# Patient Record
Sex: Female | Born: 1970 | Race: White | Hispanic: No | Marital: Single | Smoking: Current some day smoker
Health system: Southern US, Community
[De-identification: ages and names within clinical notes are randomized; demographics above are authoritative.]

## PROBLEM LIST (undated history)

## (undated) DIAGNOSIS — F419 Anxiety disorder, unspecified: Secondary | ICD-10-CM

## (undated) DIAGNOSIS — J449 Chronic obstructive pulmonary disease, unspecified: Secondary | ICD-10-CM

## (undated) DIAGNOSIS — K589 Irritable bowel syndrome without diarrhea: Secondary | ICD-10-CM

## (undated) DIAGNOSIS — J40 Bronchitis, not specified as acute or chronic: Secondary | ICD-10-CM

## (undated) DIAGNOSIS — G8929 Other chronic pain: Secondary | ICD-10-CM

## (undated) DIAGNOSIS — Z765 Malingerer [conscious simulation]: Secondary | ICD-10-CM

## (undated) DIAGNOSIS — F99 Mental disorder, not otherwise specified: Secondary | ICD-10-CM

## (undated) DIAGNOSIS — G4730 Sleep apnea, unspecified: Secondary | ICD-10-CM

## (undated) DIAGNOSIS — F1910 Other psychoactive substance abuse, uncomplicated: Secondary | ICD-10-CM

## (undated) DIAGNOSIS — F319 Bipolar disorder, unspecified: Secondary | ICD-10-CM

## (undated) DIAGNOSIS — K089 Disorder of teeth and supporting structures, unspecified: Secondary | ICD-10-CM

## (undated) DIAGNOSIS — M545 Low back pain: Secondary | ICD-10-CM

## (undated) DIAGNOSIS — N390 Urinary tract infection, site not specified: Secondary | ICD-10-CM

## (undated) DIAGNOSIS — R609 Edema, unspecified: Secondary | ICD-10-CM

## (undated) DIAGNOSIS — K219 Gastro-esophageal reflux disease without esophagitis: Secondary | ICD-10-CM

## (undated) HISTORY — DX: Gastro-esophageal reflux disease without esophagitis: K21.9

## (undated) HISTORY — DX: Bipolar disorder, unspecified: F31.9

## (undated) HISTORY — DX: Irritable bowel syndrome without diarrhea: K58.9

## (undated) HISTORY — DX: Anxiety disorder, unspecified: F41.9

## (undated) HISTORY — DX: Mental disorder, not otherwise specified: F99

## (undated) HISTORY — DX: Urinary tract infection, site not specified: N39.0

## (undated) HISTORY — DX: Low back pain: M54.5

## (undated) HISTORY — DX: Sleep apnea, unspecified: G47.30

## (undated) HISTORY — DX: Other psychoactive substance abuse, uncomplicated: F19.10

## (undated) HISTORY — DX: Other chronic pain: G89.29

## (undated) HISTORY — PX: WISDOM TOOTH EXTRACTION: SHX21

---

## 1998-05-14 ENCOUNTER — Inpatient Hospital Stay (HOSPITAL_COMMUNITY): Admission: AD | Admit: 1998-05-14 | Discharge: 1998-05-14 | Payer: Self-pay | Admitting: Obstetrics & Gynecology

## 1998-11-24 HISTORY — PX: DILATION AND CURETTAGE OF UTERUS: SHX78

## 1998-12-30 ENCOUNTER — Emergency Department (HOSPITAL_COMMUNITY): Admission: EM | Admit: 1998-12-30 | Discharge: 1998-12-30 | Payer: Self-pay | Admitting: Emergency Medicine

## 1998-12-31 ENCOUNTER — Encounter: Payer: Self-pay | Admitting: Emergency Medicine

## 1998-12-31 ENCOUNTER — Emergency Department (HOSPITAL_COMMUNITY): Admission: EM | Admit: 1998-12-31 | Discharge: 1998-12-31 | Payer: Self-pay | Admitting: Emergency Medicine

## 1999-01-05 ENCOUNTER — Inpatient Hospital Stay (HOSPITAL_COMMUNITY): Admission: AD | Admit: 1999-01-05 | Discharge: 1999-01-05 | Payer: Self-pay | Admitting: *Deleted

## 1999-05-01 ENCOUNTER — Encounter: Payer: Self-pay | Admitting: Emergency Medicine

## 1999-05-01 ENCOUNTER — Emergency Department (HOSPITAL_COMMUNITY): Admission: EM | Admit: 1999-05-01 | Discharge: 1999-05-01 | Payer: Self-pay | Admitting: *Deleted

## 1999-05-21 ENCOUNTER — Inpatient Hospital Stay (HOSPITAL_COMMUNITY): Admission: AD | Admit: 1999-05-21 | Discharge: 1999-05-21 | Payer: Self-pay | Admitting: *Deleted

## 1999-05-27 ENCOUNTER — Encounter: Payer: Self-pay | Admitting: *Deleted

## 1999-07-09 ENCOUNTER — Inpatient Hospital Stay (HOSPITAL_COMMUNITY): Admission: AD | Admit: 1999-07-09 | Discharge: 1999-07-09 | Payer: Self-pay | Admitting: *Deleted

## 1999-10-05 ENCOUNTER — Emergency Department (HOSPITAL_COMMUNITY): Admission: EM | Admit: 1999-10-05 | Discharge: 1999-10-05 | Payer: Self-pay | Admitting: Emergency Medicine

## 1999-10-05 ENCOUNTER — Encounter: Payer: Self-pay | Admitting: Emergency Medicine

## 1999-10-31 ENCOUNTER — Ambulatory Visit (HOSPITAL_COMMUNITY): Admission: RE | Admit: 1999-10-31 | Discharge: 1999-10-31 | Payer: Self-pay | Admitting: *Deleted

## 1999-11-05 ENCOUNTER — Emergency Department (HOSPITAL_COMMUNITY): Admission: EM | Admit: 1999-11-05 | Discharge: 1999-11-05 | Payer: Self-pay | Admitting: Emergency Medicine

## 1999-11-25 HISTORY — PX: TUBAL LIGATION: SHX77

## 1999-12-02 ENCOUNTER — Emergency Department (HOSPITAL_COMMUNITY): Admission: EM | Admit: 1999-12-02 | Discharge: 1999-12-03 | Payer: Self-pay | Admitting: Emergency Medicine

## 1999-12-10 ENCOUNTER — Emergency Department (HOSPITAL_COMMUNITY): Admission: EM | Admit: 1999-12-10 | Discharge: 1999-12-10 | Payer: Self-pay | Admitting: Emergency Medicine

## 1999-12-14 ENCOUNTER — Inpatient Hospital Stay (HOSPITAL_COMMUNITY): Admission: AD | Admit: 1999-12-14 | Discharge: 1999-12-14 | Payer: Self-pay | Admitting: Obstetrics

## 1999-12-15 ENCOUNTER — Inpatient Hospital Stay (HOSPITAL_COMMUNITY): Admission: AD | Admit: 1999-12-15 | Discharge: 1999-12-15 | Payer: Self-pay | Admitting: Obstetrics

## 1999-12-24 ENCOUNTER — Inpatient Hospital Stay (HOSPITAL_COMMUNITY): Admission: AD | Admit: 1999-12-24 | Discharge: 1999-12-24 | Payer: Self-pay | Admitting: *Deleted

## 2000-01-06 ENCOUNTER — Inpatient Hospital Stay (HOSPITAL_COMMUNITY): Admission: AD | Admit: 2000-01-06 | Discharge: 2000-01-06 | Payer: Self-pay | Admitting: Obstetrics

## 2000-01-07 ENCOUNTER — Inpatient Hospital Stay (HOSPITAL_COMMUNITY): Admission: AD | Admit: 2000-01-07 | Discharge: 2000-01-07 | Payer: Self-pay | Admitting: *Deleted

## 2000-01-11 ENCOUNTER — Emergency Department (HOSPITAL_COMMUNITY): Admission: EM | Admit: 2000-01-11 | Discharge: 2000-01-11 | Payer: Self-pay | Admitting: *Deleted

## 2000-01-14 ENCOUNTER — Inpatient Hospital Stay (HOSPITAL_COMMUNITY): Admission: AD | Admit: 2000-01-14 | Discharge: 2000-01-14 | Payer: Self-pay | Admitting: *Deleted

## 2000-01-16 ENCOUNTER — Inpatient Hospital Stay (HOSPITAL_COMMUNITY): Admission: AD | Admit: 2000-01-16 | Discharge: 2000-01-16 | Payer: Self-pay | Admitting: Obstetrics & Gynecology

## 2000-01-22 ENCOUNTER — Inpatient Hospital Stay (HOSPITAL_COMMUNITY): Admission: AD | Admit: 2000-01-22 | Discharge: 2000-01-22 | Payer: Self-pay | Admitting: Obstetrics

## 2000-01-23 ENCOUNTER — Inpatient Hospital Stay (HOSPITAL_COMMUNITY): Admission: AD | Admit: 2000-01-23 | Discharge: 2000-01-25 | Payer: Self-pay | Admitting: Obstetrics & Gynecology

## 2000-02-06 ENCOUNTER — Emergency Department (HOSPITAL_COMMUNITY): Admission: EM | Admit: 2000-02-06 | Discharge: 2000-02-06 | Payer: Self-pay | Admitting: Emergency Medicine

## 2000-02-11 ENCOUNTER — Emergency Department (HOSPITAL_COMMUNITY): Admission: EM | Admit: 2000-02-11 | Discharge: 2000-02-11 | Payer: Self-pay | Admitting: *Deleted

## 2000-02-13 ENCOUNTER — Inpatient Hospital Stay (HOSPITAL_COMMUNITY): Admission: AD | Admit: 2000-02-13 | Discharge: 2000-02-13 | Payer: Self-pay | Admitting: Obstetrics & Gynecology

## 2000-02-14 ENCOUNTER — Emergency Department (HOSPITAL_COMMUNITY): Admission: EM | Admit: 2000-02-14 | Discharge: 2000-02-14 | Payer: Self-pay | Admitting: Emergency Medicine

## 2000-02-21 ENCOUNTER — Observation Stay (HOSPITAL_COMMUNITY): Admission: AD | Admit: 2000-02-21 | Discharge: 2000-02-22 | Payer: Self-pay | Admitting: Obstetrics

## 2000-02-21 ENCOUNTER — Encounter (INDEPENDENT_AMBULATORY_CARE_PROVIDER_SITE_OTHER): Payer: Self-pay

## 2000-02-21 ENCOUNTER — Encounter: Payer: Self-pay | Admitting: Obstetrics

## 2000-02-27 ENCOUNTER — Inpatient Hospital Stay (HOSPITAL_COMMUNITY): Admission: AD | Admit: 2000-02-27 | Discharge: 2000-02-27 | Payer: Self-pay | Admitting: Obstetrics & Gynecology

## 2000-03-01 ENCOUNTER — Emergency Department (HOSPITAL_COMMUNITY): Admission: EM | Admit: 2000-03-01 | Discharge: 2000-03-01 | Payer: Self-pay | Admitting: Emergency Medicine

## 2000-03-12 ENCOUNTER — Emergency Department (HOSPITAL_COMMUNITY): Admission: EM | Admit: 2000-03-12 | Discharge: 2000-03-12 | Payer: Self-pay | Admitting: Emergency Medicine

## 2000-03-13 ENCOUNTER — Other Ambulatory Visit: Admission: RE | Admit: 2000-03-13 | Discharge: 2000-03-13 | Payer: Self-pay | Admitting: Obstetrics & Gynecology

## 2000-03-13 ENCOUNTER — Encounter: Admission: RE | Admit: 2000-03-13 | Discharge: 2000-03-13 | Payer: Self-pay | Admitting: Obstetrics & Gynecology

## 2000-03-23 ENCOUNTER — Ambulatory Visit (HOSPITAL_COMMUNITY): Admission: RE | Admit: 2000-03-23 | Discharge: 2000-03-23 | Payer: Self-pay | Admitting: Obstetrics & Gynecology

## 2000-03-29 ENCOUNTER — Emergency Department (HOSPITAL_COMMUNITY): Admission: EM | Admit: 2000-03-29 | Discharge: 2000-03-29 | Payer: Self-pay | Admitting: Emergency Medicine

## 2000-04-30 ENCOUNTER — Encounter: Admission: RE | Admit: 2000-04-30 | Discharge: 2000-04-30 | Payer: Self-pay | Admitting: Obstetrics

## 2000-07-10 ENCOUNTER — Emergency Department (HOSPITAL_COMMUNITY): Admission: EM | Admit: 2000-07-10 | Discharge: 2000-07-10 | Payer: Self-pay | Admitting: Emergency Medicine

## 2000-07-19 ENCOUNTER — Encounter: Payer: Self-pay | Admitting: *Deleted

## 2000-07-19 ENCOUNTER — Emergency Department (HOSPITAL_COMMUNITY): Admission: EM | Admit: 2000-07-19 | Discharge: 2000-07-19 | Payer: Self-pay | Admitting: Emergency Medicine

## 2000-08-18 ENCOUNTER — Inpatient Hospital Stay (HOSPITAL_COMMUNITY): Admission: AD | Admit: 2000-08-18 | Discharge: 2000-08-18 | Payer: Self-pay | Admitting: *Deleted

## 2001-03-17 ENCOUNTER — Encounter: Payer: Self-pay | Admitting: Emergency Medicine

## 2001-03-17 ENCOUNTER — Emergency Department (HOSPITAL_COMMUNITY): Admission: EM | Admit: 2001-03-17 | Discharge: 2001-03-17 | Payer: Self-pay | Admitting: Emergency Medicine

## 2001-03-25 ENCOUNTER — Emergency Department (HOSPITAL_COMMUNITY): Admission: EM | Admit: 2001-03-25 | Discharge: 2001-03-25 | Payer: Self-pay | Admitting: Emergency Medicine

## 2001-07-30 ENCOUNTER — Emergency Department (HOSPITAL_COMMUNITY): Admission: EM | Admit: 2001-07-30 | Discharge: 2001-07-30 | Payer: Self-pay | Admitting: Emergency Medicine

## 2001-09-24 ENCOUNTER — Inpatient Hospital Stay (HOSPITAL_COMMUNITY): Admission: AD | Admit: 2001-09-24 | Discharge: 2001-09-24 | Payer: Self-pay | Admitting: *Deleted

## 2001-09-24 ENCOUNTER — Encounter: Payer: Self-pay | Admitting: *Deleted

## 2001-10-08 ENCOUNTER — Emergency Department (HOSPITAL_COMMUNITY): Admission: EM | Admit: 2001-10-08 | Discharge: 2001-10-08 | Payer: Self-pay | Admitting: Emergency Medicine

## 2001-11-07 ENCOUNTER — Inpatient Hospital Stay (HOSPITAL_COMMUNITY): Admission: AD | Admit: 2001-11-07 | Discharge: 2001-11-07 | Payer: Self-pay | Admitting: *Deleted

## 2001-11-09 ENCOUNTER — Encounter (HOSPITAL_COMMUNITY): Admission: RE | Admit: 2001-11-09 | Discharge: 2001-11-11 | Payer: Self-pay | Admitting: *Deleted

## 2001-11-10 ENCOUNTER — Inpatient Hospital Stay (HOSPITAL_COMMUNITY): Admission: AD | Admit: 2001-11-10 | Discharge: 2001-11-12 | Payer: Self-pay | Admitting: Obstetrics

## 2001-12-01 ENCOUNTER — Encounter: Admission: RE | Admit: 2001-12-01 | Discharge: 2001-12-01 | Payer: Self-pay

## 2001-12-31 ENCOUNTER — Encounter: Admission: RE | Admit: 2001-12-31 | Discharge: 2001-12-31 | Payer: Self-pay | Admitting: Internal Medicine

## 2002-01-25 ENCOUNTER — Emergency Department (HOSPITAL_COMMUNITY): Admission: EM | Admit: 2002-01-25 | Discharge: 2002-01-25 | Payer: Self-pay

## 2002-01-27 ENCOUNTER — Encounter: Admission: RE | Admit: 2002-01-27 | Discharge: 2002-01-27 | Payer: Self-pay

## 2002-02-19 ENCOUNTER — Inpatient Hospital Stay (HOSPITAL_COMMUNITY): Admission: AD | Admit: 2002-02-19 | Discharge: 2002-02-19 | Payer: Self-pay | Admitting: *Deleted

## 2002-03-14 ENCOUNTER — Emergency Department (HOSPITAL_COMMUNITY): Admission: EM | Admit: 2002-03-14 | Discharge: 2002-03-14 | Payer: Self-pay

## 2002-03-14 ENCOUNTER — Encounter: Payer: Self-pay | Admitting: Emergency Medicine

## 2002-04-26 ENCOUNTER — Encounter: Payer: Self-pay | Admitting: Emergency Medicine

## 2002-04-26 ENCOUNTER — Emergency Department (HOSPITAL_COMMUNITY): Admission: EM | Admit: 2002-04-26 | Discharge: 2002-04-26 | Payer: Self-pay | Admitting: Emergency Medicine

## 2002-05-29 ENCOUNTER — Encounter (INDEPENDENT_AMBULATORY_CARE_PROVIDER_SITE_OTHER): Payer: Self-pay

## 2002-05-29 ENCOUNTER — Encounter: Payer: Self-pay | Admitting: Obstetrics and Gynecology

## 2002-05-29 ENCOUNTER — Observation Stay (HOSPITAL_COMMUNITY): Admission: AD | Admit: 2002-05-29 | Discharge: 2002-05-30 | Payer: Self-pay | Admitting: Obstetrics and Gynecology

## 2002-11-19 ENCOUNTER — Encounter: Payer: Self-pay | Admitting: Emergency Medicine

## 2002-11-19 ENCOUNTER — Emergency Department (HOSPITAL_COMMUNITY): Admission: EM | Admit: 2002-11-19 | Discharge: 2002-11-19 | Payer: Self-pay | Admitting: Emergency Medicine

## 2002-12-04 ENCOUNTER — Emergency Department (HOSPITAL_COMMUNITY): Admission: EM | Admit: 2002-12-04 | Discharge: 2002-12-04 | Payer: Self-pay | Admitting: Emergency Medicine

## 2003-02-23 ENCOUNTER — Emergency Department (HOSPITAL_COMMUNITY): Admission: EM | Admit: 2003-02-23 | Discharge: 2003-02-23 | Payer: Self-pay | Admitting: *Deleted

## 2003-12-31 ENCOUNTER — Emergency Department (HOSPITAL_COMMUNITY): Admission: EM | Admit: 2003-12-31 | Discharge: 2003-12-31 | Payer: Self-pay | Admitting: Emergency Medicine

## 2004-01-02 ENCOUNTER — Emergency Department (HOSPITAL_COMMUNITY): Admission: EM | Admit: 2004-01-02 | Discharge: 2004-01-02 | Payer: Self-pay | Admitting: Emergency Medicine

## 2004-01-05 ENCOUNTER — Emergency Department (HOSPITAL_COMMUNITY): Admission: EM | Admit: 2004-01-05 | Discharge: 2004-01-05 | Payer: Self-pay | Admitting: Family Medicine

## 2004-01-25 ENCOUNTER — Inpatient Hospital Stay (HOSPITAL_COMMUNITY): Admission: AD | Admit: 2004-01-25 | Discharge: 2004-01-25 | Payer: Self-pay | Admitting: Obstetrics & Gynecology

## 2004-02-03 ENCOUNTER — Emergency Department (HOSPITAL_COMMUNITY): Admission: EM | Admit: 2004-02-03 | Discharge: 2004-02-03 | Payer: Self-pay | Admitting: Emergency Medicine

## 2004-02-05 ENCOUNTER — Emergency Department (HOSPITAL_COMMUNITY): Admission: EM | Admit: 2004-02-05 | Discharge: 2004-02-06 | Payer: Self-pay | Admitting: Emergency Medicine

## 2004-03-01 ENCOUNTER — Emergency Department (HOSPITAL_COMMUNITY): Admission: EM | Admit: 2004-03-01 | Discharge: 2004-03-01 | Payer: Self-pay | Admitting: Emergency Medicine

## 2004-06-22 ENCOUNTER — Emergency Department (HOSPITAL_COMMUNITY): Admission: EM | Admit: 2004-06-22 | Discharge: 2004-06-22 | Payer: Self-pay | Admitting: Emergency Medicine

## 2004-07-22 ENCOUNTER — Emergency Department (HOSPITAL_COMMUNITY): Admission: EM | Admit: 2004-07-22 | Discharge: 2004-07-22 | Payer: Self-pay | Admitting: Emergency Medicine

## 2004-07-25 ENCOUNTER — Emergency Department (HOSPITAL_COMMUNITY): Admission: EM | Admit: 2004-07-25 | Discharge: 2004-07-25 | Payer: Self-pay | Admitting: Emergency Medicine

## 2004-08-21 ENCOUNTER — Emergency Department (HOSPITAL_COMMUNITY): Admission: EM | Admit: 2004-08-21 | Discharge: 2004-08-21 | Payer: Self-pay | Admitting: Emergency Medicine

## 2004-09-09 ENCOUNTER — Emergency Department (HOSPITAL_COMMUNITY): Admission: EM | Admit: 2004-09-09 | Discharge: 2004-09-09 | Payer: Self-pay | Admitting: Emergency Medicine

## 2004-09-15 ENCOUNTER — Emergency Department (HOSPITAL_COMMUNITY): Admission: EM | Admit: 2004-09-15 | Discharge: 2004-09-15 | Payer: Self-pay | Admitting: Emergency Medicine

## 2004-09-15 ENCOUNTER — Emergency Department (HOSPITAL_COMMUNITY): Admission: EM | Admit: 2004-09-15 | Discharge: 2004-09-15 | Payer: Self-pay | Admitting: *Deleted

## 2004-09-25 ENCOUNTER — Emergency Department (HOSPITAL_COMMUNITY): Admission: EM | Admit: 2004-09-25 | Discharge: 2004-09-25 | Payer: Self-pay | Admitting: Emergency Medicine

## 2004-10-02 ENCOUNTER — Emergency Department (HOSPITAL_COMMUNITY): Admission: EM | Admit: 2004-10-02 | Discharge: 2004-10-02 | Payer: Self-pay | Admitting: Emergency Medicine

## 2004-10-31 ENCOUNTER — Emergency Department (HOSPITAL_COMMUNITY): Admission: EM | Admit: 2004-10-31 | Discharge: 2004-10-31 | Payer: Self-pay | Admitting: Emergency Medicine

## 2004-11-06 ENCOUNTER — Emergency Department (HOSPITAL_COMMUNITY): Admission: EM | Admit: 2004-11-06 | Discharge: 2004-11-06 | Payer: Self-pay | Admitting: Emergency Medicine

## 2004-11-07 ENCOUNTER — Emergency Department (HOSPITAL_COMMUNITY): Admission: EM | Admit: 2004-11-07 | Discharge: 2004-11-07 | Payer: Self-pay | Admitting: Emergency Medicine

## 2004-12-05 ENCOUNTER — Emergency Department (HOSPITAL_COMMUNITY): Admission: EM | Admit: 2004-12-05 | Discharge: 2004-12-05 | Payer: Self-pay | Admitting: Emergency Medicine

## 2004-12-12 ENCOUNTER — Emergency Department (HOSPITAL_COMMUNITY): Admission: EM | Admit: 2004-12-12 | Discharge: 2004-12-12 | Payer: Self-pay | Admitting: Emergency Medicine

## 2004-12-28 ENCOUNTER — Encounter: Payer: Self-pay | Admitting: Emergency Medicine

## 2004-12-28 ENCOUNTER — Inpatient Hospital Stay (HOSPITAL_COMMUNITY): Admission: AD | Admit: 2004-12-28 | Discharge: 2004-12-31 | Payer: Self-pay | Admitting: Psychiatry

## 2004-12-28 ENCOUNTER — Ambulatory Visit: Payer: Self-pay | Admitting: *Deleted

## 2005-01-27 ENCOUNTER — Emergency Department (HOSPITAL_COMMUNITY): Admission: EM | Admit: 2005-01-27 | Discharge: 2005-01-27 | Payer: Self-pay | Admitting: Emergency Medicine

## 2005-01-31 ENCOUNTER — Emergency Department: Payer: Self-pay | Admitting: General Practice

## 2005-02-12 ENCOUNTER — Emergency Department (HOSPITAL_COMMUNITY): Admission: EM | Admit: 2005-02-12 | Discharge: 2005-02-12 | Payer: Self-pay | Admitting: Emergency Medicine

## 2005-03-03 ENCOUNTER — Emergency Department (HOSPITAL_COMMUNITY): Admission: EM | Admit: 2005-03-03 | Discharge: 2005-03-03 | Payer: Self-pay | Admitting: Emergency Medicine

## 2005-03-05 ENCOUNTER — Inpatient Hospital Stay (HOSPITAL_COMMUNITY): Admission: EM | Admit: 2005-03-05 | Discharge: 2005-03-09 | Payer: Self-pay | Admitting: Emergency Medicine

## 2005-04-10 ENCOUNTER — Inpatient Hospital Stay (HOSPITAL_COMMUNITY): Admission: AD | Admit: 2005-04-10 | Discharge: 2005-04-10 | Payer: Self-pay | Admitting: Obstetrics & Gynecology

## 2005-04-11 ENCOUNTER — Emergency Department (HOSPITAL_COMMUNITY): Admission: EM | Admit: 2005-04-11 | Discharge: 2005-04-12 | Payer: Self-pay | Admitting: Emergency Medicine

## 2005-04-17 ENCOUNTER — Emergency Department (HOSPITAL_COMMUNITY): Admission: EM | Admit: 2005-04-17 | Discharge: 2005-04-17 | Payer: Self-pay | Admitting: Emergency Medicine

## 2005-04-18 ENCOUNTER — Inpatient Hospital Stay (HOSPITAL_COMMUNITY): Admission: AD | Admit: 2005-04-18 | Discharge: 2005-04-19 | Payer: Self-pay | Admitting: Obstetrics & Gynecology

## 2005-09-02 ENCOUNTER — Emergency Department (HOSPITAL_COMMUNITY): Admission: EM | Admit: 2005-09-02 | Discharge: 2005-09-02 | Payer: Self-pay | Admitting: Emergency Medicine

## 2005-10-28 ENCOUNTER — Emergency Department (HOSPITAL_COMMUNITY): Admission: EM | Admit: 2005-10-28 | Discharge: 2005-10-28 | Payer: Self-pay | Admitting: Emergency Medicine

## 2005-10-31 ENCOUNTER — Emergency Department (HOSPITAL_COMMUNITY): Admission: EM | Admit: 2005-10-31 | Discharge: 2005-10-31 | Payer: Self-pay | Admitting: Emergency Medicine

## 2005-11-08 ENCOUNTER — Emergency Department (HOSPITAL_COMMUNITY): Admission: EM | Admit: 2005-11-08 | Discharge: 2005-11-08 | Payer: Self-pay | Admitting: Emergency Medicine

## 2005-11-10 ENCOUNTER — Emergency Department (HOSPITAL_COMMUNITY): Admission: EM | Admit: 2005-11-10 | Discharge: 2005-11-10 | Payer: Self-pay | Admitting: Emergency Medicine

## 2005-11-15 ENCOUNTER — Emergency Department (HOSPITAL_COMMUNITY): Admission: EM | Admit: 2005-11-15 | Discharge: 2005-11-15 | Payer: Self-pay | Admitting: *Deleted

## 2005-12-30 ENCOUNTER — Emergency Department (HOSPITAL_COMMUNITY): Admission: EM | Admit: 2005-12-30 | Discharge: 2005-12-30 | Payer: Self-pay | Admitting: Emergency Medicine

## 2006-07-22 ENCOUNTER — Emergency Department (HOSPITAL_COMMUNITY): Admission: EM | Admit: 2006-07-22 | Discharge: 2006-07-22 | Payer: Self-pay | Admitting: Emergency Medicine

## 2006-09-18 ENCOUNTER — Emergency Department (HOSPITAL_COMMUNITY): Admission: EM | Admit: 2006-09-18 | Discharge: 2006-09-18 | Payer: Self-pay | Admitting: Emergency Medicine

## 2006-12-11 ENCOUNTER — Emergency Department (HOSPITAL_COMMUNITY): Admission: EM | Admit: 2006-12-11 | Discharge: 2006-12-11 | Payer: Self-pay | Admitting: Emergency Medicine

## 2006-12-14 ENCOUNTER — Emergency Department (HOSPITAL_COMMUNITY): Admission: EM | Admit: 2006-12-14 | Discharge: 2006-12-14 | Payer: Self-pay | Admitting: Emergency Medicine

## 2007-03-08 IMAGING — CT CT HEAD W/O CM
2 of 3 series · 15 of 30 positions shown, 18 images · IV contrast (CONTRAST)
Comparison: 12/28/04.

CLINICAL DATA: Headache.   
HEAD CT WITHOUT CONTRAST:
TECHNIQUE: Contiguous axial images were obtained from the base of the skull through the vertex according to standard protocol without contrast media.
CLINICAL DATA: Follow-up to 10/31/05 CT which revealed small subcapsular hematoma.  
ABDOMEN CT WITHOUT CONTRAST:
TECHNIQUE: Multidetector CT imaging of the abdomen was performed following the standard protocol without IV contrast.

[Series 8180: — · axial · 0.44mm/px · z∈[-642,-562]mm · 4 of 28 slices shown (1 of 2)]
[im 6/28  brain]
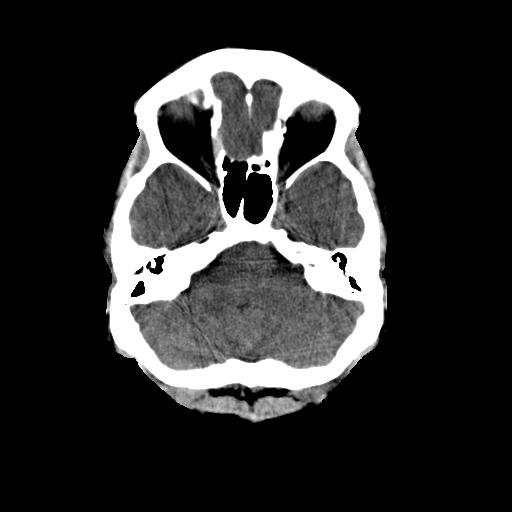
[im 11/28  brain]
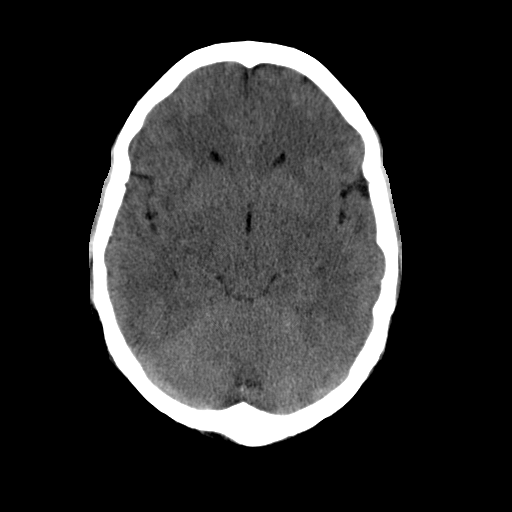
[im 17/28  brain]
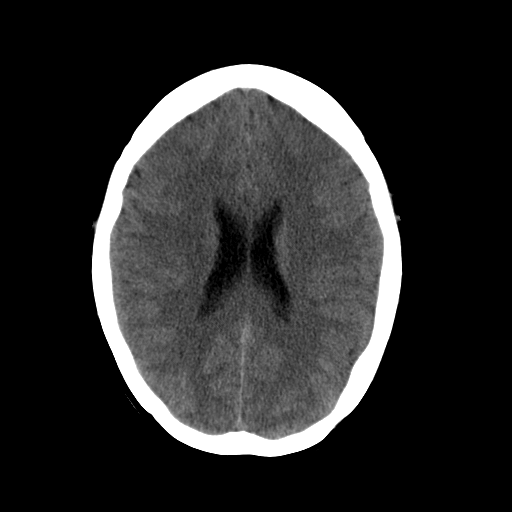
[im 22/28  brain]
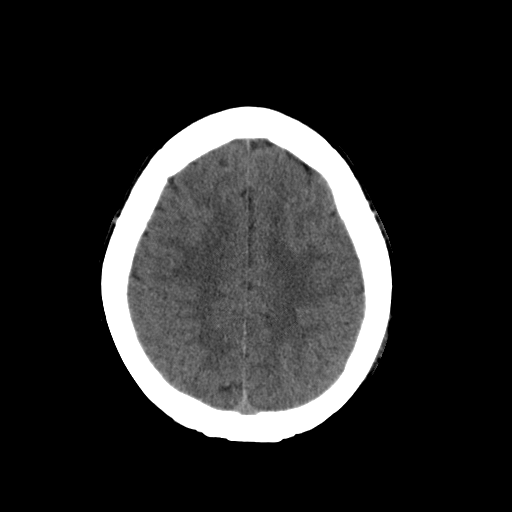

[Series 8183: — · axial · 0.63mm/px · z∈[-1212,-967]mm · 11 of 59 slices shown, 14 images (2 of 2)]
[im 5/59  brain]
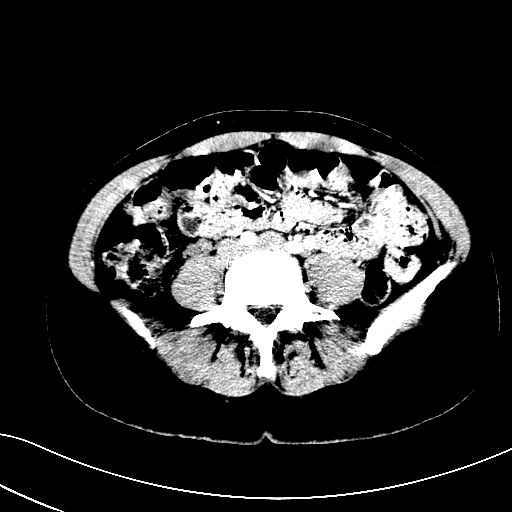
[im 5/59  bone]
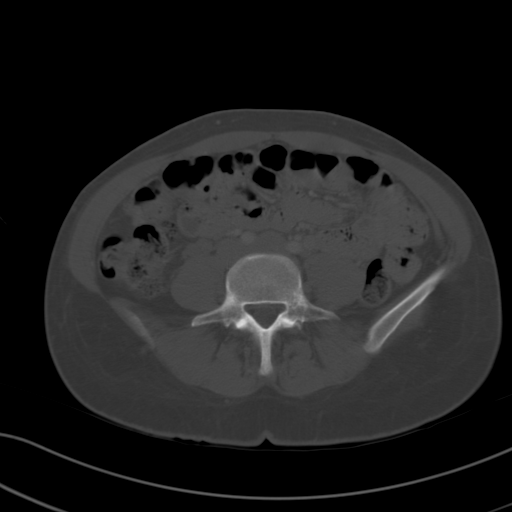
[im 10/59  brain]
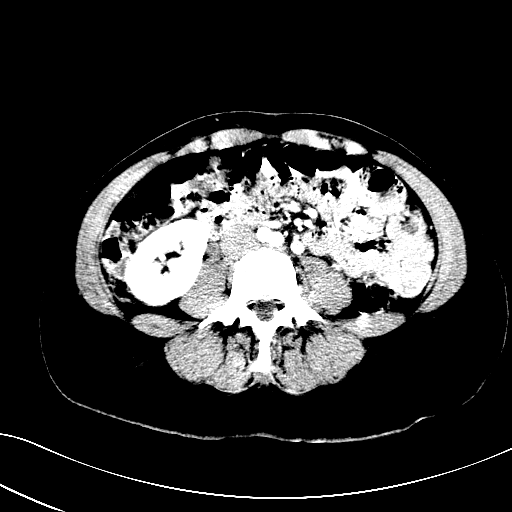
[im 15/59  brain]
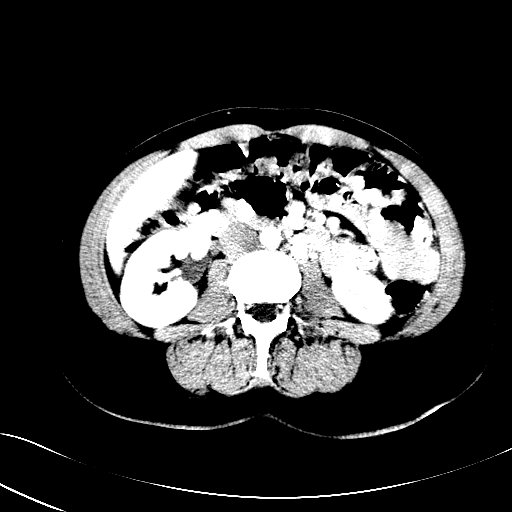
[im 20/59  brain]
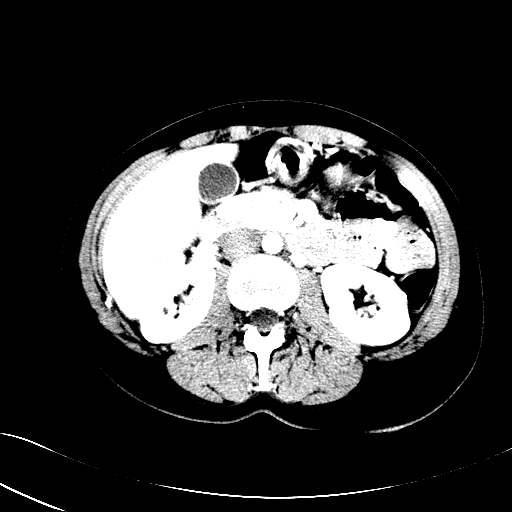
[im 25/59  brain]
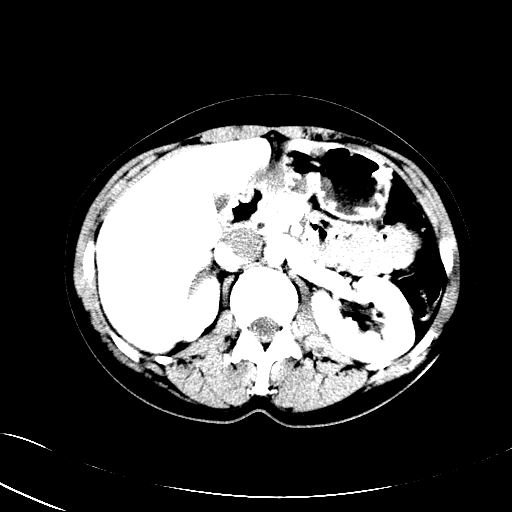
[im 25/59  bone]
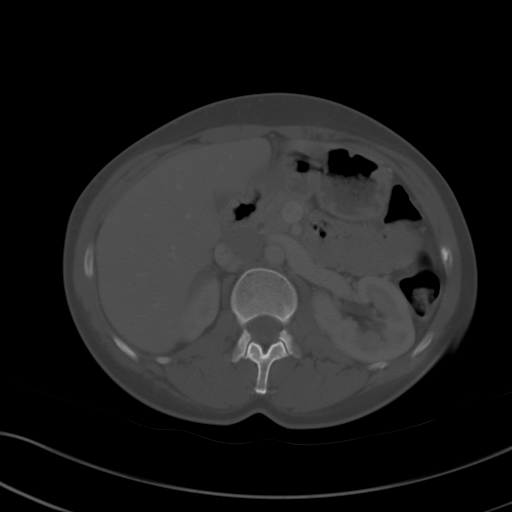
[im 30/59  brain]
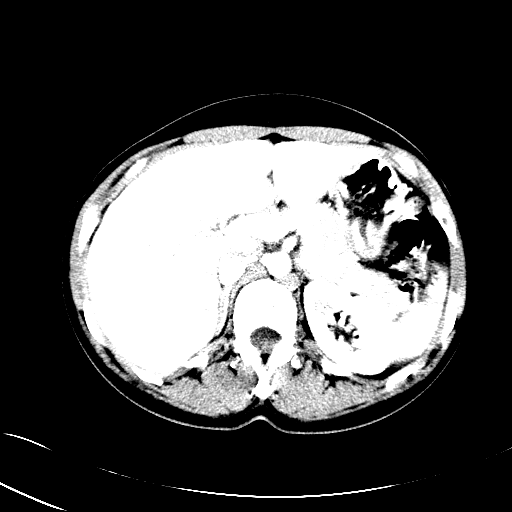
[im 34/59  brain]
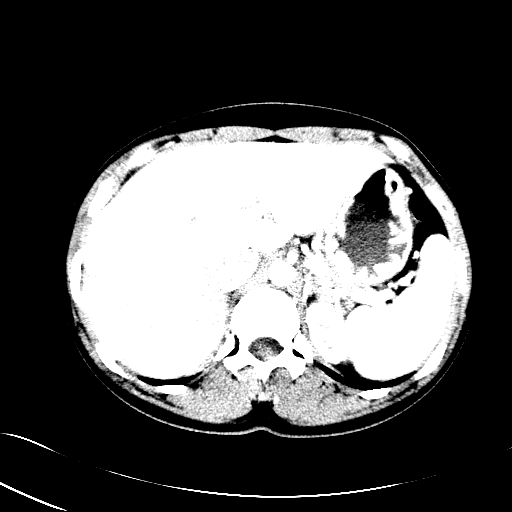
[im 39/59  brain]
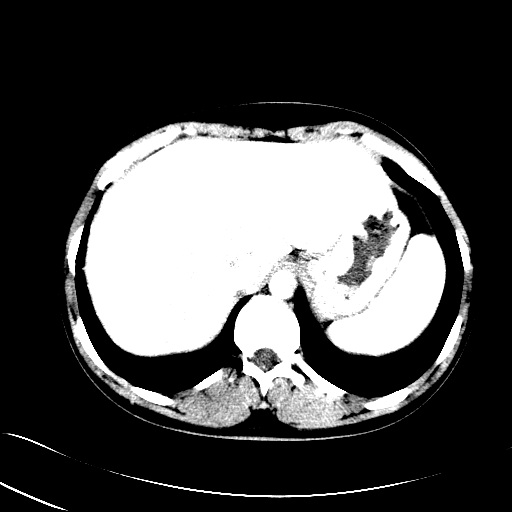
[im 44/59  brain]
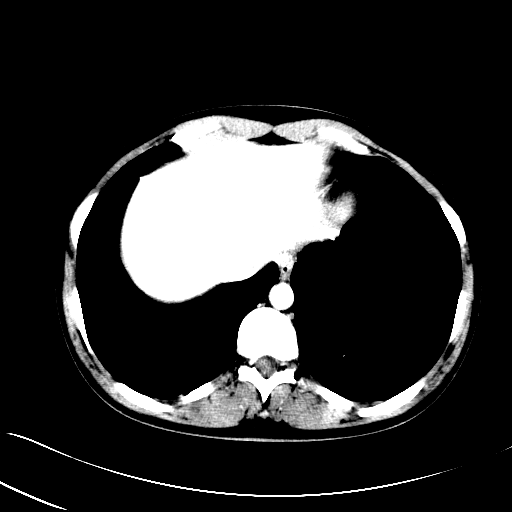
[im 44/59  bone]
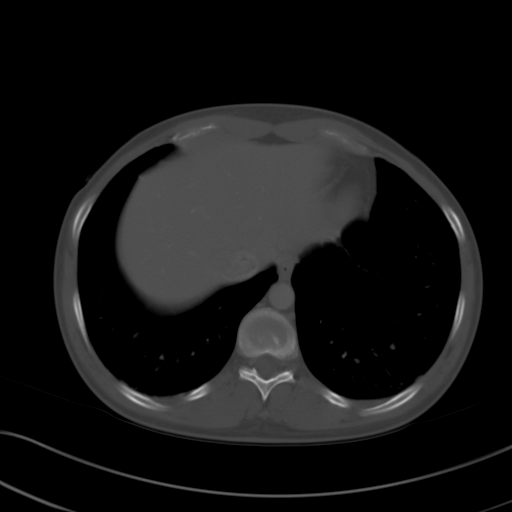
[im 49/59  brain]
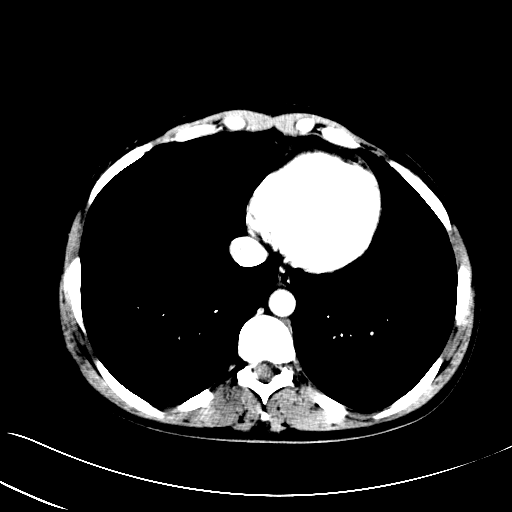
[im 54/59  brain]
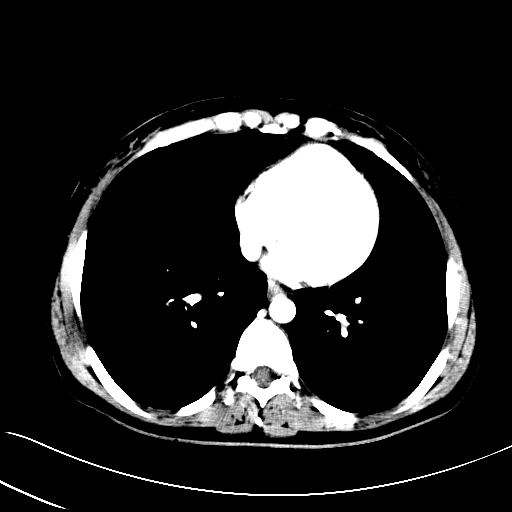

[15 of 30 positions shown; findings below may reference images not displayed]

There is no evidence of intracranial hemorrhage, brain edema, or mass effect.  No other intra-axial abnormalities are seen, and the ventricles are within normal limits.  No abnormal extra-axial fluid collections or masses are identified.  No skull abnormalities are noted.
IMPRESSION: Negative non-contrast head CT.
FINDINGS: The subcapsular hematoma along the medial aspect at the posterior segment right hepatic lobe is smaller now.  Currently it measures approximately 1.8 x 3.5 cm.    Fracture of anterolateral aspect of 2 or 3 right lower ribs is again appreciated.
IMPRESSION: Some interval decrease in size of subcapsular hematoma of the liver. See comments above.

## 2007-05-12 ENCOUNTER — Emergency Department (HOSPITAL_COMMUNITY): Admission: EM | Admit: 2007-05-12 | Discharge: 2007-05-12 | Payer: Self-pay | Admitting: Emergency Medicine

## 2007-07-07 ENCOUNTER — Emergency Department (HOSPITAL_COMMUNITY): Admission: EM | Admit: 2007-07-07 | Discharge: 2007-07-07 | Payer: Self-pay | Admitting: Emergency Medicine

## 2007-08-04 ENCOUNTER — Emergency Department (HOSPITAL_COMMUNITY): Admission: EM | Admit: 2007-08-04 | Discharge: 2007-08-04 | Payer: Self-pay | Admitting: Emergency Medicine

## 2007-11-28 ENCOUNTER — Emergency Department (HOSPITAL_COMMUNITY): Admission: EM | Admit: 2007-11-28 | Discharge: 2007-11-28 | Payer: Self-pay | Admitting: Emergency Medicine

## 2007-12-01 ENCOUNTER — Emergency Department (HOSPITAL_COMMUNITY): Admission: EM | Admit: 2007-12-01 | Discharge: 2007-12-01 | Payer: Self-pay | Admitting: Emergency Medicine

## 2008-05-12 ENCOUNTER — Emergency Department (HOSPITAL_COMMUNITY): Admission: EM | Admit: 2008-05-12 | Discharge: 2008-05-13 | Payer: Self-pay | Admitting: Emergency Medicine

## 2008-05-24 ENCOUNTER — Emergency Department (HOSPITAL_COMMUNITY): Admission: EM | Admit: 2008-05-24 | Discharge: 2008-05-24 | Payer: Self-pay | Admitting: Emergency Medicine

## 2008-06-13 ENCOUNTER — Emergency Department (HOSPITAL_COMMUNITY): Admission: EM | Admit: 2008-06-13 | Discharge: 2008-06-14 | Payer: Self-pay | Admitting: Emergency Medicine

## 2008-06-14 ENCOUNTER — Emergency Department (HOSPITAL_COMMUNITY): Admission: EM | Admit: 2008-06-14 | Discharge: 2008-06-14 | Payer: Self-pay | Admitting: Emergency Medicine

## 2008-07-15 ENCOUNTER — Emergency Department (HOSPITAL_COMMUNITY): Admission: EM | Admit: 2008-07-15 | Discharge: 2008-07-15 | Payer: Self-pay | Admitting: Emergency Medicine

## 2008-08-01 ENCOUNTER — Emergency Department (HOSPITAL_COMMUNITY): Admission: EM | Admit: 2008-08-01 | Discharge: 2008-08-01 | Payer: Self-pay | Admitting: Emergency Medicine

## 2008-11-20 ENCOUNTER — Emergency Department (HOSPITAL_COMMUNITY): Admission: EM | Admit: 2008-11-20 | Discharge: 2008-11-20 | Payer: Self-pay | Admitting: Emergency Medicine

## 2008-11-26 ENCOUNTER — Emergency Department (HOSPITAL_COMMUNITY): Admission: EM | Admit: 2008-11-26 | Discharge: 2008-11-26 | Payer: Self-pay | Admitting: Emergency Medicine

## 2008-12-06 ENCOUNTER — Emergency Department (HOSPITAL_COMMUNITY): Admission: EM | Admit: 2008-12-06 | Discharge: 2008-12-06 | Payer: Self-pay | Admitting: Emergency Medicine

## 2009-02-15 ENCOUNTER — Emergency Department (HOSPITAL_COMMUNITY): Admission: EM | Admit: 2009-02-15 | Discharge: 2009-02-15 | Payer: Self-pay | Admitting: Emergency Medicine

## 2009-04-03 ENCOUNTER — Emergency Department (HOSPITAL_COMMUNITY): Admission: EM | Admit: 2009-04-03 | Discharge: 2009-04-03 | Payer: Self-pay | Admitting: Emergency Medicine

## 2009-05-02 ENCOUNTER — Emergency Department (HOSPITAL_COMMUNITY): Admission: EM | Admit: 2009-05-02 | Discharge: 2009-05-02 | Payer: Self-pay | Admitting: Emergency Medicine

## 2009-05-10 ENCOUNTER — Emergency Department (HOSPITAL_COMMUNITY): Admission: EM | Admit: 2009-05-10 | Discharge: 2009-05-10 | Payer: Self-pay | Admitting: Emergency Medicine

## 2009-05-12 ENCOUNTER — Emergency Department (HOSPITAL_COMMUNITY): Admission: EM | Admit: 2009-05-12 | Discharge: 2009-05-12 | Payer: Self-pay | Admitting: Emergency Medicine

## 2009-05-28 ENCOUNTER — Emergency Department (HOSPITAL_COMMUNITY): Admission: EM | Admit: 2009-05-28 | Discharge: 2009-05-28 | Payer: Self-pay | Admitting: Emergency Medicine

## 2009-05-29 ENCOUNTER — Emergency Department (HOSPITAL_COMMUNITY): Admission: EM | Admit: 2009-05-29 | Discharge: 2009-05-29 | Payer: Self-pay | Admitting: Emergency Medicine

## 2009-07-04 ENCOUNTER — Emergency Department (HOSPITAL_COMMUNITY): Admission: EM | Admit: 2009-07-04 | Discharge: 2009-07-04 | Payer: Self-pay | Admitting: Emergency Medicine

## 2009-07-08 ENCOUNTER — Emergency Department (HOSPITAL_COMMUNITY): Admission: EM | Admit: 2009-07-08 | Discharge: 2009-07-08 | Payer: Self-pay | Admitting: Emergency Medicine

## 2009-07-18 ENCOUNTER — Emergency Department (HOSPITAL_COMMUNITY): Admission: EM | Admit: 2009-07-18 | Discharge: 2009-07-18 | Payer: Self-pay | Admitting: Emergency Medicine

## 2009-08-30 ENCOUNTER — Emergency Department (HOSPITAL_COMMUNITY): Admission: EM | Admit: 2009-08-30 | Discharge: 2009-08-30 | Payer: Self-pay | Admitting: Emergency Medicine

## 2010-01-06 ENCOUNTER — Emergency Department (HOSPITAL_COMMUNITY): Admission: EM | Admit: 2010-01-06 | Discharge: 2010-01-06 | Payer: Self-pay | Admitting: Emergency Medicine

## 2010-03-20 ENCOUNTER — Emergency Department (HOSPITAL_COMMUNITY): Admission: EM | Admit: 2010-03-20 | Discharge: 2010-03-20 | Payer: Self-pay | Admitting: Emergency Medicine

## 2010-04-18 ENCOUNTER — Emergency Department (HOSPITAL_COMMUNITY): Admission: EM | Admit: 2010-04-18 | Discharge: 2010-04-18 | Payer: Self-pay | Admitting: Emergency Medicine

## 2010-04-26 ENCOUNTER — Ambulatory Visit: Payer: Self-pay | Admitting: Family Medicine

## 2010-04-26 DIAGNOSIS — F1920 Other psychoactive substance dependence, uncomplicated: Secondary | ICD-10-CM | POA: Insufficient documentation

## 2010-04-26 DIAGNOSIS — F411 Generalized anxiety disorder: Secondary | ICD-10-CM | POA: Insufficient documentation

## 2010-04-26 DIAGNOSIS — F329 Major depressive disorder, single episode, unspecified: Secondary | ICD-10-CM

## 2010-04-26 DIAGNOSIS — M545 Low back pain, unspecified: Secondary | ICD-10-CM | POA: Insufficient documentation

## 2010-04-29 ENCOUNTER — Telehealth: Payer: Self-pay | Admitting: Family Medicine

## 2010-04-29 ENCOUNTER — Emergency Department (HOSPITAL_COMMUNITY): Admission: EM | Admit: 2010-04-29 | Discharge: 2010-04-29 | Payer: Self-pay | Admitting: Emergency Medicine

## 2010-04-29 ENCOUNTER — Telehealth: Payer: Self-pay | Admitting: Internal Medicine

## 2010-05-01 ENCOUNTER — Telehealth: Payer: Self-pay | Admitting: *Deleted

## 2010-05-08 ENCOUNTER — Telehealth: Payer: Self-pay | Admitting: Family Medicine

## 2010-05-13 ENCOUNTER — Ambulatory Visit: Payer: Self-pay | Admitting: Family Medicine

## 2010-05-22 ENCOUNTER — Telehealth: Payer: Self-pay | Admitting: Family Medicine

## 2010-06-04 ENCOUNTER — Ambulatory Visit: Payer: Self-pay | Admitting: Family Medicine

## 2010-06-26 ENCOUNTER — Telehealth: Payer: Self-pay | Admitting: Family Medicine

## 2010-06-26 ENCOUNTER — Ambulatory Visit: Payer: Self-pay | Admitting: Family Medicine

## 2010-07-04 ENCOUNTER — Ambulatory Visit: Payer: Self-pay | Admitting: Internal Medicine

## 2010-07-25 ENCOUNTER — Telehealth: Payer: Self-pay | Admitting: Family Medicine

## 2010-07-26 ENCOUNTER — Ambulatory Visit: Payer: Self-pay | Admitting: Family Medicine

## 2010-07-26 DIAGNOSIS — N390 Urinary tract infection, site not specified: Secondary | ICD-10-CM | POA: Insufficient documentation

## 2010-07-26 DIAGNOSIS — J069 Acute upper respiratory infection, unspecified: Secondary | ICD-10-CM | POA: Insufficient documentation

## 2010-07-31 ENCOUNTER — Ambulatory Visit: Payer: Self-pay | Admitting: Family Medicine

## 2010-08-07 ENCOUNTER — Telehealth: Payer: Self-pay | Admitting: Family Medicine

## 2010-08-20 ENCOUNTER — Ambulatory Visit: Payer: Self-pay | Admitting: Family Medicine

## 2010-08-20 DIAGNOSIS — G4700 Insomnia, unspecified: Secondary | ICD-10-CM | POA: Insufficient documentation

## 2010-08-22 ENCOUNTER — Telehealth: Payer: Self-pay | Admitting: Family Medicine

## 2010-08-29 ENCOUNTER — Telehealth: Payer: Self-pay | Admitting: Family Medicine

## 2010-09-02 ENCOUNTER — Telehealth: Payer: Self-pay | Admitting: Family Medicine

## 2010-09-24 ENCOUNTER — Telehealth: Payer: Self-pay | Admitting: Family Medicine

## 2010-10-15 ENCOUNTER — Emergency Department (HOSPITAL_COMMUNITY)
Admission: EM | Admit: 2010-10-15 | Discharge: 2010-10-15 | Payer: Self-pay | Source: Home / Self Care | Admitting: Emergency Medicine

## 2010-10-21 ENCOUNTER — Telehealth: Payer: Self-pay | Admitting: Family Medicine

## 2010-11-06 ENCOUNTER — Ambulatory Visit: Payer: Self-pay | Admitting: Family Medicine

## 2010-11-06 DIAGNOSIS — J209 Acute bronchitis, unspecified: Secondary | ICD-10-CM

## 2010-11-07 ENCOUNTER — Telehealth: Payer: Self-pay | Admitting: Family Medicine

## 2010-11-24 DIAGNOSIS — M545 Low back pain, unspecified: Secondary | ICD-10-CM

## 2010-11-24 DIAGNOSIS — K589 Irritable bowel syndrome without diarrhea: Secondary | ICD-10-CM

## 2010-11-24 DIAGNOSIS — F319 Bipolar disorder, unspecified: Secondary | ICD-10-CM

## 2010-11-24 HISTORY — DX: Bipolar disorder, unspecified: F31.9

## 2010-11-24 HISTORY — DX: Irritable bowel syndrome, unspecified: K58.9

## 2010-11-24 HISTORY — DX: Low back pain, unspecified: M54.50

## 2010-11-27 ENCOUNTER — Telehealth: Payer: Self-pay | Admitting: Family Medicine

## 2010-11-27 ENCOUNTER — Ambulatory Visit
Admission: RE | Admit: 2010-11-27 | Discharge: 2010-11-27 | Payer: Self-pay | Source: Home / Self Care | Attending: Family Medicine | Admitting: Family Medicine

## 2010-11-27 LAB — CONVERTED CEMR LAB
Blood in Urine, dipstick: NEGATIVE
Glucose, Urine, Semiquant: NEGATIVE
Specific Gravity, Urine: >=1.03
WBC Urine, dipstick: NEGATIVE
pH: 5

## 2010-12-14 ENCOUNTER — Encounter: Payer: Self-pay | Admitting: Emergency Medicine

## 2010-12-24 ENCOUNTER — Telehealth: Payer: Self-pay | Admitting: Family Medicine

## 2010-12-24 NOTE — Assessment & Plan Note (Signed)
Summary: ABD PAIN / DIARRHEA // RS   Vital Signs:  Patient profile:   40 year old female Weight:      180 pounds BMI:     33.04 Temp:     98.3 degrees F oral BP sitting:   88 / 64  (left arm) Cuff size:   regular  Vitals Entered By: Raechel Ache, RN (July 26, 2010 1:10 PM) CC: C/o sore throat, cough, diarrhea since Monday and pains low back.   History of Present Illness: Here for 2 different symptom complexes. She does have chronic low back pain, but over the past week she has had pain on both sides in the middle of her back (a little higher than usual). She has had some mild urgency to urinate at times but no burning. Also she has had a few days of PND, ST, and a dry cough. No fever.   Allergies: 1)  ! Vicodin (Hydrocodone-Acetaminophen) 2)  ! Ibuprofen (Ibuprofen)  Past History:  Past Medical History: Reviewed history from 04/26/2010 and no changes required. 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    Physical Exam  General:  Well-developed,well-nourished,in no acute distress; alert,appropriate and cooperative throughout examination Head:  Normocephalic and atraumatic without obvious abnormalities. No apparent alopecia or balding. Eyes:  No corneal or conjunctival inflammation noted. EOMI.  Perrla. Funduscopic exam benign, without hemorrhages, exudates or papilledema. Vision grossly normal. Ears:  External ear exam shows no significant lesions or deformities.  Otoscopic examination reveals clear canals, tympanic membranes are intact bilaterally without bulging, retraction, inflammation or discharge. Hearing is grossly normal bilaterally. Nose:  External nasal examination shows no deformity or inflammation. Nasal mucosa are pink and moist without lesions or exudates. Mouth:  Oral mucosa and oropharynx without lesions or exudates.  Teeth in good repair. Neck:  No deformities, masses, or tenderness noted. Lungs:  Normal respiratory effort, chest expands symmetrically. Lungs are clear to auscultation, no crackles or wheezes. Abdomen:  Bowel sounds positive,abdomen soft and non-tender without masses, organomegaly or hernias noted.   Impression & Recommendations:  Problem # 1:  UTI (ICD-599.0)  Her updated medication list for this problem includes:    Ciprofloxacin Hcl 500 Mg Tabs (Ciprofloxacin hcl) .Marland Kitchen..Marland Kitchen Two times a day  Orders: UA Dipstick w/o Micro (manual) ([address])  Problem # 2:  VIRAL URI (ICD-465.9)  Complete Medication List: 1)  Percocet 10-325 Mg Tabs (Oxycodone-acetaminophen) .Marland Kitchen.. 1 q 6 hours as needed pain 2)  Alprazolam 1 Mg Tabs (Alprazolam) .... Three times a day as needed anxiety 3)  Wellbutrin Xl 300 Mg Xr24h-tab (Bupropion hcl) .... Once daily 4)  Ciprofloxacin Hcl 500 Mg Tabs (Ciprofloxacin hcl) .... Two times a day  Patient Instructions: 1)  Drink plenty of fluids.  2)  Please schedule a follow-up appointment as needed .  Prescriptions: CIPROFLOXACIN HCL 500 MG TABS (CIPROFLOXACIN HCL) two times a day  #14 x 0   Entered and Authorized by:   Nelwyn Salisbury MD   Signed by:   Nelwyn Salisbury MD  on 07/26/2010   Method used:   Electronically to        [facility]* (retail)       [address]       [address] Shipping/mailing        [place], [place]  [address]       Ph: [phone]       Fax: [phone]   RxID:   [id number]

## 2010-12-24 NOTE — Progress Notes (Signed)
Summary: REQ FOR RX  Phone Note Call from Patient   Caller: Patient  [phone] Summary of Call: Pt called to request that a Rx for cough med be sent to  [facility]...Marland KitchenMarland Kitchen Pt has been exp cough, congestion, runny nose...Marland KitchenMarland Kitchen Pt was offered OV but declined due to financial reasons.  Initial call taken by: Debbra Riding,  August 29, 2010 2:42 PM  Follow-up for Phone Call        No I caanot call in a narcotic cough med without seeing her Follow-up by: Nelwyn Salisbury MD,  August 30, 2010 8:46 AM  Additional Follow-up for Phone Call Additional follow up Details #1::        pt has chest congestion and sore throat now Additional Follow-up by: Heron Sabins,  August 30, 2010 11:52 AM    Additional Follow-up for Phone Call Additional follow up Details #2::    Pt called and I notified her that Dr. Clent Ridges said that he could not call in a narcotic cough med without her being seen. Pt said that she would call in on Monday 09/02/10 to sch ov if she is not feeling better.   Follow-up by: Lucy Antigua,  August 30, 2010 12:02 PM  Additional Follow-up for Phone Call Additional follow up Details #3:: Details for Additional Follow-up Action Taken: noted Additional Follow-up by: Nelwyn Salisbury MD,  August 30, 2010 1:24 PM

## 2010-12-24 NOTE — Progress Notes (Signed)
Summary: new rxs  Phone Note Call from Patient Call back at [phone]   Caller: Patient--live call Summary of Call: wants to go off of prozac and go on Wellbutrin. Please call when rx is ready for pickup. need refills on xanax and Percocet. Going out of town.  pt knows that the xanax and Percocet must be dated when due next week. Initial call taken by: Warnell Forester,  May 22, 2010 1:31 PM  Follow-up for Phone Call        Pt called wanting to know status of refill req. Pt says she is leaving to go out of town tomorrow.  Follow-up by: Lucy Antigua,  May 22, 2010 3:46 PM  Additional Follow-up for Phone Call Additional follow up Details #1::        I will write for short term refills for the xanax and Percocet since I will be out of town all next week. I want her to stay on Prozac for now Additional Follow-up by: Nelwyn Salisbury MD,  May 22, 2010 5:23 PM    Additional Follow-up for Phone Call Additional follow up Details #2::    called Follow-up by: Raechel Ache, RN,  May 22, 2010 5:26 PM  Prescriptions: ALPRAZOLAM 1 MG TABS (ALPRAZOLAM) three times a day as needed anxiety  #60 x 0   Entered and Authorized by:   Nelwyn Salisbury MD   Signed by:   Nelwyn Salisbury MD on 05/22/2010   Method used:   Print then Give to Patient   RxID:   [id number] PERCOCET 10-325 MG TABS (OXYCODONE-ACETAMINOPHEN) 1 q 6 hours as needed pain  #60 x 0   Entered and Authorized by:   Nelwyn Salisbury MD   Signed by:   Nelwyn Salisbury MD on 05/22/2010   Method used:   Print then Give to Patient   RxID:   [id number]

## 2010-12-24 NOTE — Progress Notes (Signed)
Summary: REFILL REQUEST  Phone Note Refill Request Call back at Home Phone [phone] Call back at Work Phone [phone] Message from:  Patient  [phone] on October 21, 2010 11:56 AM  Refills Requested: Medication #1:  PERCOCET 10-325 MG TABS 1 q 6 hours as needed pain   Notes: Pt can be reached at [phone] when Rx is ready for p/u.Marland KitchenMarland KitchenMarland KitchenPt adv that she had to take more than usual due to using med after having teeth pulled. Pls call pt when ready for pick up.    Initial call taken by: Debbra Riding,  October 21, 2010 11:56 AM  Follow-up for Phone Call        done  Follow-up by: Nelwyn Salisbury MD,  October 21, 2010 4:10 PM  Additional Follow-up for Phone Call Additional follow up Details #1::        left mess  that rx ready for pick up  Additional Follow-up by: Pura Spice, RN,  October 21, 2010 5:51 PM    New/Updated Medications: PERCOCET 10-325 MG TABS (OXYCODONE-ACETAMINOPHEN) 1 q 6 hours as needed pain Prescriptions: PERCOCET 10-325 MG TABS (OXYCODONE-ACETAMINOPHEN) 1 q 6 hours as needed pain  #120 x 0   Entered and Authorized by:   Nelwyn Salisbury MD   Signed by:   Nelwyn Salisbury MD on 10/21/2010   Method used:   Print then Give to Patient   RxID:   [id number]

## 2010-12-24 NOTE — Progress Notes (Signed)
Summary: REFILL REQUEST  (Percocet)  Phone Note Refill Request Message from:  Patient   [phone] on September 24, 2010 9:10 AM  Refills Requested: Medication #1:  PERCOCET 10-325 MG TABS 1 q 6 hours as needed pain   Notes: Pt would like to come by to p/u med around 11:30am today... Pt can be reached at [phone].    Initial call taken by: Debbra Riding,  September 24, 2010 9:11 AM  Follow-up for Phone Call        done Follow-up by: Nelwyn Salisbury MD,  September 24, 2010 9:22 AM  Additional Follow-up for Phone Call Additional follow up Details #1::        pt aware Additional Follow-up by: Pura Spice, RN,  September 24, 2010 9:42 AM    Prescriptions: PERCOCET 10-325 MG TABS (OXYCODONE-ACETAMINOPHEN) 1 q 6 hours as needed pain  #120 x 0   Entered by:   Nelwyn Salisbury MD   Authorized by:   Pura Spice, RN   Signed by:   Nelwyn Salisbury MD on 09/24/2010   Method used:   Print then Give to Patient   RxID:   [id number]

## 2010-12-24 NOTE — Progress Notes (Signed)
Summary: ok early refill  Phone Note Outgoing Call   Call placed to: [facility] Action Taken: Phone Call Completed Summary of Call: Per Dr Clent Ridges, ok for 1 time early refill of Alprazolam 1 mg, due to pt going out of town. Initial call taken by: Warnell Forester,  June 26, 2010 3:25 PM

## 2010-12-24 NOTE — Progress Notes (Signed)
Summary: Pt req script for Percocet  Phone Note Refill Request Call back at [phone] cell Message from:  Patient on August 22, 2010 1:42 PM  Refills Requested: Medication #1:  PERCOCET 10-325 MG TABS 1 q 6 hours as needed pain Pt is req to pick up script on Friday or on Monday. Pls call when ready.    Method Requested: Pick up at Office Initial call taken by: Lucy Antigua,  August 22, 2010 1:42 PM  Follow-up for Phone Call        Pt was seen in office 9/27/11for trouble sleeping. Percocet was last filled on 07/31/10 for 120 tabs and 0 refills. Please advise...Marland Kitchen? Follow-up by: Kathrynn Speed CMA,  August 22, 2010 3:13 PM  Additional Follow-up for Phone Call Additional follow up Details #1::        Pt called back to check on status of Percocet script. Pt says she will not get script filled until monday 08/26/10 and it can be dated for that date.  Additional Follow-up by: Lucy Antigua,  August 23, 2010 9:01 AM    Additional Follow-up for Phone Call Additional follow up Details #2::    done Follow-up by: Nelwyn Salisbury MD,  August 23, 2010 1:02 PM  Additional Follow-up for Phone Call Additional follow up Details #3:: Details for Additional Follow-up Action Taken: pt aware. Additional Follow-up by: Pura Spice, RN,  August 23, 2010 1:21 PM  Prescriptions: PERCOCET 10-325 MG TABS (OXYCODONE-ACETAMINOPHEN) 1 q 6 hours as needed pain  #120 x 0   Entered and Authorized by:   Nelwyn Salisbury MD   Signed by:   Nelwyn Salisbury MD on 08/23/2010   Method used:   Print then Give to Patient   RxID:   [id number]

## 2010-12-24 NOTE — Assessment & Plan Note (Signed)
Summary: fup on meds/ccm   Vital Signs:  Patient profile:   40 year old female Weight:      178 pounds BP sitting:   90 / 60  (left arm) Cuff size:   regular  Vitals Entered By: Raechel Ache, RN (June 04, 2010 2:03 PM) CC: Follow-up, Prozac made her sick.   History of Present Illness: Here to follow up on back pain, anxiety, and depression. She tried Prozac for about a week but had to stop this due to intolerable nausea. She has felt fine since stopping it. Her back has been stable, and she has been dealing with her anxiety fairly well. She was laid off of her job, but she is looking for another one. Still living with her mother and avoiding her previous boyfriend. She wants to try Wellbutrin since her mother does well on this.   Allergies: 1)  ! Vicodin (Hydrocodone-Acetaminophen) 2)  ! Ibuprofen (Ibuprofen)  Past History:  Past Medical History: Reviewed history from 04/26/2010 and no changes required. 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    Physical Exam  General:  Well-developed,well-nourished,in no acute distress; alert,appropriate and cooperative throughout examination Neurologic:  alert & oriented X3 and gait normal.   Psych:  Cognition and judgment appear intact. Alert and  cooperative with normal attention span and concentration. No apparent delusions, illusions, hallucinations   Impression & Recommendations:  Problem # 1:  BACK PAIN, LUMBAR, CHRONIC (ICD-724.2)  Her updated medication list for this problem includes:    Percocet 10-325 Mg Tabs (Oxycodone-acetaminophen) .Marland Kitchen... 1 q 6 hours as needed pain  Problem # 2:  DEPRESSION, RECURRENT (ICD-311)  The following medications were removed from the medication list:    Prozac 20 Mg Caps (Fluoxetine hcl) ..... Once daily Her updated medication list for this problem includes:    Alprazolam 1 Mg Tabs (Alprazolam) .Marland Kitchen... Three times a day as needed anxiety    Wellbutrin Xl 150 Mg Xr24h-tab (Bupropion hcl) ..... Once daily  Problem # 3:  ANXIETY STATE, UNSPECIFIED (ICD-300.00)  The following medications were removed from the medication list:    Prozac 20 Mg Caps (Fluoxetine hcl) ..... Once daily Her updated medication list for this problem includes:    Alprazolam 1 Mg Tabs (Alprazolam) .Marland Kitchen... Three times a day as needed anxiety    Wellbutrin Xl 150 Mg Xr24h-tab (Bupropion hcl) ..... Once daily  Complete Medication List: 1)  Percocet 10-325 Mg Tabs (Oxycodone-acetaminophen) .Marland Kitchen.. 1 q 6 hours as needed pain 2)  Alprazolam 1 Mg Tabs (Alprazolam) .... Three times a day as needed anxiety 3)  Wellbutrin Xl 150 Mg Xr24h-tab (Bupropion hcl) .... Once daily  Patient Instructions: 1)  We spent 30 minutes discussing these issues. We will try Wellbutrin. Follow up in 3 weeks Prescriptions: PERCOCET 10-325 MG TABS (OXYCODONE-ACETAMINOPHEN) 1 q 6 hours as needed pain  #120 x 0   Entered and Authorized by:   Nelwyn Salisbury MD   Signed by:   Nelwyn Salisbury MD on 06/04/2010  Method used:   Print then Give to Patient   RxID:   [id number] ALPRAZOLAM 1 MG TABS (ALPRAZOLAM) three times a day as needed anxiety  #90 x 2   Entered and Authorized by:   Nelwyn Salisbury MD   Signed by:   Nelwyn Salisbury MD on 06/04/2010   Method  used:   Print then Give to Patient   RxID:   [id number] WELLBUTRIN XL 150 MG XR24H-TAB (BUPROPION HCL) once daily  #30 x 5   Entered and Authorized by:   Nelwyn Salisbury MD   Signed by:   Nelwyn Salisbury MD on 06/04/2010   Method used:   Print then Give to Patient   RxID:   [id number]
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
## 2010-12-24 NOTE — Progress Notes (Signed)
Summary: Pt req med called in for diarrhea, cough and sore throat  Phone Note Call from Patient Call back at [phone] cell   Caller: Patient Summary of Call: Pt called and said that she has had diarrhea for 3 days and a cough with sore throat. Pt is wondering if she can have a med called in to [facility] [phone]    and fax # [phone] Initial call taken by: Lucy Antigua,  July 25, 2010 11:29 AM  Follow-up for Phone Call        she needs an OV  Follow-up by: Nelwyn Salisbury MD,  July 25, 2010 12:51 PM  Additional Follow-up for Phone Call Additional follow up Details #1::        Spoke with pt and she scheduled appt for tomorrow, 9/2 with Dr Clent Ridges.  Additional Follow-up by: Debbra Riding,  July 25, 2010 1:46 PM

## 2010-12-24 NOTE — Assessment & Plan Note (Signed)
Summary: trouble sleeping/njr   Vital Signs:  Patient profile:   40 year old female Weight:      178 pounds Temp:     98 degrees F oral Pulse rate:   81 / minute Resp:     12 per minute BP sitting:   102 / 62  Vitals Entered By: Lynann Beaver CMA (August 20, 2010 2:14 PM) CC: insomnia Is Patient Diabetic? No Pain Assessment Patient in pain? no        History of Present Illness: Here for trouble sleeping. This has been more of an issue on the past month. She found that by taking two Xanax tabs at a time an hour before bed she can sleep well. Otherwise doing well.   Current Medications (verified): 1)  Percocet 10-325 Mg Tabs (Oxycodone-Acetaminophen) .Marland Kitchen.. 1 Q 6 Hours As Needed Pain 2)  Alprazolam 1 Mg Tabs (Alprazolam) .... Three Times A Day As Needed Anxiety  Allergies (verified): 1)  ! Vicodin (Hydrocodone-Acetaminophen) 2)  ! Ibuprofen (Ibuprofen) 3)  ! Pcn  Past History:  Past Medical History: Reviewed history from 04/26/2010 and no changes required. 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    Physical Exam  General:  Well-developed,well-nourished,in no acute distress; alert,appropriate and cooperative throughout examination Neurologic:  alert & oriented X3, cranial  nerves II-XII intact, strength normal in all extremities, and gait normal.   Psych:  Cognition and judgment appear intact. Alert and cooperative with normal attention span and concentration. No apparent delusions, illusions, hallucinations   Impression & Recommendations:  Problem # 1:  INSOMNIA (ICD-780.52)  Problem # 2:  DEPRESSION, RECURRENT (ICD-311)  Her updated medication list for this problem includes:    Alprazolam 1 Mg Tabs (Alprazolam) .Marland Kitchen... 1 each morning, 1 during the day, and 2 at night  Problem # 3:  ANXIETY STATE, UNSPECIFIED (ICD-300.00)  Her updated medication list for this problem includes:    Alprazolam 1 Mg Tabs (Alprazolam) .Marland Kitchen... 1 each morning, 1 during the day, and 2 at night  Complete Medication List: 1)  Percocet 10-325 Mg Tabs (Oxycodone-acetaminophen) .Marland Kitchen.. 1 q 6 hours as needed pain 2)  Alprazolam 1 Mg Tabs (Alprazolam) .Marland Kitchen.. 1 each morning, 1 during the day, and 2 at night  Patient Instructions: 1)  Please schedule a follow-up appointment in 3 months .  Prescriptions: ALPRAZOLAM 1 MG TABS (ALPRAZOLAM) 1 each morning, 1 during the day, and 2 at night  #120 x 2   Entered and Authorized by:   Nelwyn Salisbury MD   Signed by:   Nelwyn Salisbury MD on 08/20/2010   Method used:   Print then Give to Patient   RxID:   [id number]

## 2010-12-24 NOTE — Assessment & Plan Note (Signed)
Summary: ROA/FUP/RCD   Vital Signs:  Patient profile:   40 year old female Weight:      181 pounds O2 Sat:      93 % Temp:     98.5 degrees F Pulse rate:   87 / minute BP sitting:   102 / 70  (left arm)  Vitals Entered By: Pura Spice, RN (July 31, 2010 1:01 PM) CC: low to mid back pain taking 3 percocet per day  Is Patient Diabetic? No   History of Present Illness: Here for worsening lower back pains. She is taking 3-4 Percocets a day, but these no longer manage her pain very well. She is now interested in seeing someone who could do some sort of procedure on her. The UTI feels much better.   Preventive Screening-Counseling & Management  Alcohol-Tobacco     Smoking Status: current     Packs/Day: 0.75     Year Started: 1983  Allergies: 1)  ! Vicodin (Hydrocodone-Acetaminophen) 2)  ! Ibuprofen (Ibuprofen) 3)  ! Pcn  Past History:  Past Medical History: Reviewed history from 04/26/2010 and no changes required. 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 Tubal ligation D and C  Social History: Packs/Day:  0.75  Review of Systems  The patient denies anorexia, fever, weight loss, weight gain, vision loss, decreased hearing, hoarseness, chest pain, syncope, dyspnea on exertion, peripheral edema, prolonged cough, headaches, hemoptysis, abdominal pain, melena, hematochezia, severe indigestion/heartburn, hematuria, incontinence, genital sores, muscle weakness, suspicious skin lesions, transient blindness, difficulty walking, depression, unusual weight change, abnormal bleeding, enlarged lymph nodes, angioedema, breast masses, and testicular masses.    Physical Exam  General:  Well-developed,well-nourished,in no acute  distress; alert,appropriate and cooperative throughout examination Msk:  tender in the lower back with reduced ROM   Impression & Recommendations:  Problem # 1:  BACK PAIN, LUMBAR, CHRONIC (ICD-724.2)  Her updated medication list for this problem includes:    Percocet 10-325 Mg Tabs (Oxycodone-acetaminophen) .Marland Kitchen... 1 q 6 hours as needed pain  Orders: Ketorolac-Toradol 15mg  (W1027) Admin of Therapeutic Inj  intramuscular or subcutaneous (25366) Misc. Referral (Misc. Ref)  Complete Medication List: 1)  Percocet 10-325 Mg Tabs (Oxycodone-acetaminophen) .Marland Kitchen.. 1 q 6 hours as needed pain 2)  Alprazolam 1 Mg Tabs (Alprazolam) .... Three times a day as needed anxiety 3)  Ciprofloxacin Hcl 500 Mg Tabs (Ciprofloxacin hcl) .... Two times a day  Patient Instructions: 1)  We will refer her to a  spinal surgeon soon for further evaluation Prescriptions: PERCOCET 10-325 MG TABS (OXYCODONE-ACETAMINOPHEN) 1 q 6 hours as needed pain  #120 x 0   Entered and Authorized by:   Nelwyn Salisbury MD   Signed by:   Nelwyn Salisbury MD on 07/31/2010   Method used:   Print then Give to Patient   RxID:   [id number]    Medication Administration  Injection # 1:    Medication: Ketorolac-Toradol 15mg     Diagnosis: BACK PAIN, LUMBAR, CHRONIC (ICD-724.2)    Route: IM    Site: RUOQ gluteus    Exp Date: 03/0102013    Lot #: [id number]    Comments: 60 mg given     Patient tolerated injection without complications    Given by: Pura Spice, RN (July 31, 2010 2:04 PM)  Orders Added: 1)  Est. Patient Level IV [29562] 2)  Ketorolac-Toradol 15mg  [J1885] 3)  Admin of Therapeutic Inj  intramuscular or subcutaneous [96372] 4)  Misc. Referral [Misc. Ref]

## 2010-12-24 NOTE — Progress Notes (Signed)
Summary: pt decline ov  Phone Note Call from Patient Call back at Work Phone [phone]   Caller: Patient Call For: Nelwyn Salisbury MD Summary of Call: pt still has a cough requesting something to be call into [facility] [phone] Initial call taken by: Heron Sabins,  August 07, 2010 10:56 AM  Follow-up for Phone Call        Pt called to ck on status of previous call /  request.  Follow-up by: Debbra Riding,  August 07, 2010 12:30 PM  Additional Follow-up for Phone Call Additional follow up Details #1::        NO I cannot prescribe a narcotic cough med without evaluating her Additional Follow-up by: Nelwyn Salisbury MD,  August 07, 2010 1:15 PM    Additional Follow-up for Phone Call Additional follow up Details #2::    pt aware  Follow-up by: Pura Spice, RN,  August 07, 2010 1:50 PM

## 2010-12-24 NOTE — Progress Notes (Signed)
Summary: Call A Nurse   Call-A-Nurse Triage Call Report Triage Record Num: [id number] Operator: Lesli Albee Patient Name: Lynn Wallace Call Date & Time: 08/31/2010 1:30:51PM Patient Phone: [phone] PCP: Patient Gender: Female PCP Fax : Patient DOB: 22-Nov-1971 [facility] Name: [name] Reason for Call: Pt has been vomiting this am. Pt has vomited x 3. NO fever. Pt has cough that is productive of yellow that she had had for a week. RN advised UC. (office is closed now) Protocol(s) Used: Cough - Adult Recommended Outcome per Protocol: See Provider within 24 hours Reason for Outcome: Productive cough with colored sputum (other than clear or white sputum) Care Advice:  ~ Use a cool mist humidifier to moisten air. Be sure to clean according to manufacturer's instructions.  ~ SYMPTOM / CONDITION MANAGEMENT Coughing up mucus or phlegm helps to get rid of an infection. A productive cough should not be stopped. A cough medicine with guaifenesin (Robitussin, Mucinex) can help loosen the mucus. Cough medicine with dextromethorphan (DM) should be avoided. Drinking lots of fluids can help loosen the mucus too, especially warm fluids.  ~ 10/
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
## 2010-12-24 NOTE — Progress Notes (Signed)
Summary: NEED TO RE-WRITE Rx  Phone Note From Pharmacy   Caller: [facility]* Summary of Call: [facility] -   called to adv that someone will have to re-do Rx for med: PERCOCET 10-325 MG TABS..... Pt dropped the Rx off but when they went to fill it, it has been signed by the Nurse instead of the Doctor??????.... New script will need to be prepared for pt... Pt advised to call [facility] when she arrives to p/u Rx so same can be explained.  Initial call taken by: Debbra Riding,  September 24, 2010 2:45 PM  Follow-up for Phone Call        ok  dr fry aware and he wrote and signed rx and authorized rx  Follow-up by: Heron Sabins,  September 24, 2010 3:04 PM
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
## 2010-12-24 NOTE — Assessment & Plan Note (Signed)
Summary: back pain///ccm   Vital Signs:  Patient profile:   40 year old female Weight:      176 pounds BMI:     32.31 BP sitting:   88 / 64  (left arm) Cuff size:   regular  Vitals Entered By: Raechel Ache, RN (June 26, 2010 2:06 PM) CC: F/u meds.   History of Present Illness: Here to follow up on low back pain and bipolar disorder. She was unable to get the rx for Wellbutrin XL filled, so she asks for samples today. In general she is feeling better with less anxiety and less depression. Her back pain is fairly stable on Percocet. She asks for an early refill today since she and her boyfriend are leaving town for several weeks.   Allergies: 1)  ! Vicodin (Hydrocodone-Acetaminophen) 2)  ! Ibuprofen (Ibuprofen)  Past History:  Past Medical History: Reviewed history from 04/26/2010 and no changes required. 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    Physical Exam  General:  Well-developed,well-nourished,in no acute distress; alert,appropriate and cooperative throughout examination Neurologic:  alert & oriented X3 and gait normal.   Psych:  Cognition and judgment appear intact. Alert and cooperative with normal attention span and concentration. No apparent delusions, illusions,  hallucinations   Impression & Recommendations:  Problem # 1:  BACK PAIN, LUMBAR, CHRONIC (ICD-724.2)  Her updated medication list for this problem includes:    Percocet 10-325 Mg Tabs (Oxycodone-acetaminophen) .Marland Kitchen... 1 q 6 hours as needed pain  Problem # 2:  DEPRESSION, RECURRENT (ICD-311)  The following medications were removed from the medication list:    Wellbutrin Xl 150 Mg Xr24h-tab (Bupropion hcl) ..... Once daily Her updated medication list for this problem includes:    Alprazolam 1 Mg Tabs (Alprazolam) .Marland Kitchen... Three times a day as needed anxiety    Wellbutrin Xl 300 Mg Xr24h-tab (Bupropion hcl) ..... Once daily  Problem # 3:  ANXIETY STATE, UNSPECIFIED (ICD-300.00)  The following medications were removed from the medication list:    Wellbutrin Xl 150 Mg Xr24h-tab (Bupropion hcl) ..... Once daily Her updated medication list for this problem includes:    Alprazolam 1 Mg Tabs (Alprazolam) .Marland Kitchen... Three times a day as needed anxiety    Wellbutrin Xl 300 Mg Xr24h-tab (Bupropion hcl) ..... Once daily  Complete Medication List: 1)  Percocet 10-325 Mg Tabs (Oxycodone-acetaminophen) .Marland Kitchen.. 1 q 6 hours as needed pain 2)  Alprazolam 1 Mg Tabs (Alprazolam) .... Three times a day as needed anxiety 3)  Wellbutrin Xl 300 Mg Xr24h-tab (Bupropion hcl) .... Once daily  Patient Instructions: 1)  meds were refilled. Gave her samples for Wellbutrin XL.  2)  Please schedule a follow-up appointment in 1 month.  Prescriptions: PERCOCET 10-325 MG TABS (OXYCODONE-ACETAMINOPHEN) 1 q 6 hours as needed pain  #120 x 0   Entered and Authorized by:   Nelwyn Salisbury MD   Signed by:   Nelwyn Salisbury MD on 06/26/2010   Method used:   Print then Give to  Patient   RxID:   [id number]
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
## 2010-12-26 NOTE — Progress Notes (Signed)
Summary: Question about Med Refill  Phone Note Call from Patient Call back at Home Phone [phone] Call back at [phone]   Caller: Patient Summary of Call: Pt states she has switched pharmacies and is inquiring why she did not recieve 5 refills on her alprazolam rx prescribed today since she normally gets refills. Initial call taken by: Trixie Dredge,  November 27, 2010 4:14 PM  Follow-up for Phone Call        per dr Alexy Heldt only 1 refill due to 2 incidents with her meds  Follow-up by: Pura Spice, RN,  November 27, 2010 5:26 PM  Additional Follow-up for Phone Call Additional follow up Details #1::        LMTCB yesterday and today. Additional Follow-up by: Lynann Beaver CMA AAMA,  November 29, 2010 7:52 AM

## 2010-12-26 NOTE — Assessment & Plan Note (Signed)
Summary: CHEST CONGESTION AND HEADACHE//SLM   Vital Signs:  Patient profile:   40 year old female Weight:      197 pounds Temp:     97.7 degrees F oral Pulse rate:   80 / minute Pulse rhythm:   regular Resp:     16 per minute BP sitting:   102 / 70  Vitals Entered By: Lynann Beaver CMA AAMA (November 06, 2010 9:37 AM) CC: Headache/cough Is Patient Diabetic? No Pain Assessment Patient in pain? no        History of Present Illness: Here for 2 weeks of chest congestion, burning, and coughing up yellow sputum. No fever. ALso needs med refills.   Current Medications (verified): 1)  Percocet 10-325 Mg Tabs (Oxycodone-Acetaminophen) .Marland Kitchen.. 1 Q 6 Hours As Needed Pain 2)  Alprazolam 1 Mg Tabs (Alprazolam) .Marland Kitchen.. 1 Each Morning, 1 During The Day, and 2 At Night  Allergies (verified): 1)  ! Vicodin (Hydrocodone-Acetaminophen) 2)  ! Ibuprofen (Ibuprofen) 3)  ! Pcn  Past History:  Past Medical History: Reviewed history from 04/26/2010 and no changes required. 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    Physical Exam  General:  Well-developed,well-nourished,in no acute distress; alert,appropriate and cooperative throughout examination Head:  Normocephalic and atraumatic without obvious abnormalities. No  apparent alopecia or balding. Eyes:  No corneal or conjunctival inflammation noted. EOMI. Perrla. Funduscopic exam benign, without hemorrhages, exudates or papilledema. Vision grossly normal. Ears:  External ear exam shows no significant lesions or deformities.  Otoscopic examination reveals clear canals, tympanic membranes are intact bilaterally without bulging, retraction, inflammation or discharge. Hearing is grossly normal bilaterally. Nose:  External nasal examination shows no deformity or inflammation. Nasal mucosa are pink and moist without lesions or exudates. Mouth:  Oral mucosa and oropharynx without lesions or exudates.  Teeth in good repair. Neck:  No deformities, masses, or tenderness noted. Lungs:  scattered rhonchi    Impression & Recommendations:  Problem # 1:  ACUTE BRONCHITIS (ICD-466.0)  Her updated medication list for this problem includes:    Doxycycline Hyclate 100 Mg Caps (Doxycycline hyclate) .Marland Kitchen..Marland Kitchen Two times a day    Hydromet 5-1.5 Mg/87ml Syrp (Hydrocodone-homatropine) .Marland Kitchen... 1 tsp q 4 hours as needed cough  Complete Medication List: 1)  Percocet 10-325 Mg Tabs (Oxycodone-acetaminophen) .Marland Kitchen.. 1 q 6 hours as needed pain, may fill on 11-20-10 2)  Alprazolam 1 Mg Tabs (Alprazolam) .Marland Kitchen.. 1 each morning, 1 during the day, and 2 at night 3)  Doxycycline Hyclate 100 Mg Caps (Doxycycline hyclate) .... Two times a day 4)  Hydromet 5-1.5 Mg/16ml Syrp (Hydrocodone-homatropine) .Marland Kitchen.. 1 tsp q 4 hours as needed cough  Patient Instructions: 1)  Please schedule a follow-up appointment as needed .  Prescriptions: ALPRAZOLAM 1 MG TABS (ALPRAZOLAM) 1 each morning, 1 during the day, and 2 at night  #120 x 5   Entered and Authorized by:   Nelwyn Salisbury MD   Signed by:   Nelwyn Salisbury MD on 11/06/2010   Method used:   Print  then Give to Patient   RxID:   [id number] PERCOCET 10-325 MG TABS (OXYCODONE-ACETAMINOPHEN) 1 q 6 hours as needed pain, may fill on 11-20-10  #120 x 0   Entered  and Authorized by:   Nelwyn Salisbury MD   Signed by:   Nelwyn Salisbury MD on 11/06/2010   Method used:   Print then Give to Patient   RxID:   [id number] HYDROMET 5-1.5 MG/5ML SYRP (HYDROCODONE-HOMATROPINE) 1 tsp q 4 hours as needed cough  #240 x 0   Entered and Authorized by:   Nelwyn Salisbury MD   Signed by:   Nelwyn Salisbury MD on 11/06/2010   Method used:   Print then Give to Patient   RxID:   [id number] DOXYCYCLINE HYCLATE 100 MG CAPS (DOXYCYCLINE HYCLATE) two times a day  #20 x 0   Entered and Authorized by:   Nelwyn Salisbury MD   Signed by:   Nelwyn Salisbury MD on 11/06/2010   Method used:   Print then Give to Patient   RxID:   [id number]    Orders Added: 1)  Est. Patient Level IV [42595]

## 2010-12-26 NOTE — Progress Notes (Signed)
Summary: Pt req diff antibiotic also a refill on cough syrup  Phone Note Call from Patient Call back at Work Phone [phone]   Caller: Patient Summary of Call: Pt called and said that antibiotic that was just prescribed to her is making her sick to her stomach. Pt says that there is an antibitic called, Emycin (pt unsure of spelling) that works better. Also pt says that she dropped the bottle of cough syrup and majory of med spilled. Pt is req a refill. Pt says to do a written script and she will take it to pharmacy. Pls call when ready for pick up.  Initial call taken by: Lucy Antigua,  November 07, 2010 2:13 PM  Follow-up for Phone Call        pt cb/ pt pharm  call [facility] on [address] [phone] Follow-up by: Heron Sabins,  November 08, 2010 10:27 AM  Additional Follow-up for Phone Call Additional follow up Details #1::        done  Additional Follow-up by: Nelwyn Salisbury MD,  November 08, 2010 2:08 PM    Additional Follow-up for Phone Call Additional follow up Details #2::    rx called in, pt aware Follow-up by: Alfred Levins, CMA,  November 08, 2010 3:37 PM  New/Updated Medications: ZITHROMAX Z-PAK 250 MG TABS (AZITHROMYCIN) as directed Prescriptions: ZITHROMAX Z-PAK 250 MG TABS (AZITHROMYCIN) as directed  #1 x 0   Entered and Authorized by:   Nelwyn Salisbury MD   Signed by:   Nelwyn Salisbury MD on 11/08/2010   Method used:   Print then Give to Patient   RxID:   [id number] HYDROMET 5-1.5 MG/5ML SYRP (HYDROCODONE-HOMATROPINE) 1 tsp q 4 hours as needed cough  #240 x 0   Entered and Authorized by:   Nelwyn Salisbury MD   Signed by:   Nelwyn Salisbury MD on 11/08/2010   Method used:   Print then Give to Patient   RxID:   [id number]

## 2011-01-01 ENCOUNTER — Encounter: Payer: Self-pay | Admitting: Family Medicine

## 2011-01-01 ENCOUNTER — Telehealth: Payer: Self-pay | Admitting: Family Medicine

## 2011-01-01 ENCOUNTER — Ambulatory Visit (INDEPENDENT_AMBULATORY_CARE_PROVIDER_SITE_OTHER): Payer: Self-pay | Admitting: Family Medicine

## 2011-01-01 VITALS — BP 114/80 | HR 95 | Temp 97.9°F

## 2011-01-01 DIAGNOSIS — N390 Urinary tract infection, site not specified: Secondary | ICD-10-CM

## 2011-01-01 LAB — POCT URINALYSIS DIPSTICK
Blood, UA: NEGATIVE
Glucose, UA: NEGATIVE
Spec Grav, UA: 1.02
Urobilinogen, UA: 1
pH, UA: 7

## 2011-01-01 MED ORDER — SULFAMETHOXAZOLE-TRIMETHOPRIM 800-160 MG PO TABS: 1.0000 | ORAL_TABLET | Freq: Two times a day (BID) | ORAL | Status: AC

## 2011-01-01 MED ORDER — SULFAMETHOXAZOLE-TRIMETHOPRIM 800-160 MG PO TABS: 1.0000 | ORAL_TABLET | Freq: Two times a day (BID) | ORAL | Status: DC

## 2011-01-01 NOTE — Progress Notes (Signed)
Addended by: Madison Hickman on: 01/01/2011 04:37 PM   Modules accepted: Orders

## 2011-01-01 NOTE — Telephone Encounter (Signed)
We took care of this?

## 2011-01-01 NOTE — Progress Notes (Signed)
Summary: refill  Phone Note Refill Request Call back at Work Phone [phone] Message from:  Patient---live call  Refills Requested: Medication #1:  PERCOCET 10-325 MG TABS 1 q 6 hours as needed pain call pt when ready today.  Initial call taken by: Warnell Forester,  December 24, 2010 12:52 PM  Follow-up for Phone Call        done Follow-up by: Nelwyn Salisbury MD,  December 25, 2010 8:59 AM  Additional Follow-up for Phone Call Additional follow up Details #1::        pt aware Additional Follow-up by: Pura Spice, RN,  December 25, 2010 12:14 PM    Prescriptions: PERCOCET 10-325 MG TABS (OXYCODONE-ACETAMINOPHEN) 1 q 6 hours as needed pain  #120 x 0   Entered and Authorized by:   Nelwyn Salisbury MD   Signed by:   Nelwyn Salisbury MD on 12/25/2010   Method used:   Print then Give to Patient   RxID:   [id number]

## 2011-01-01 NOTE — Telephone Encounter (Signed)
Pt called back and said that script is still not at [facility]. Apparently script was sent to [facility] in [place] on [address]. Pls resend to [facility] on [address] in [place].

## 2011-01-01 NOTE — Progress Notes (Signed)
  Subjective:    Patient ID: Lynn Wallace, female    DOB: [birth year], 40 y.o.   MRN: [id number]  HPI Here for 3 days of urinary frequency, low back pains, and lge swelling. No burning or nausea or fever. We treated her for a UTI about one month ago with Bactrim DS, and her symptoms resolved completely.    Review of Systems  Constitutional: Negative.   Genitourinary: Positive for urgency and frequency. Negative for dysuria, hematuria, flank pain, decreased urine volume, enuresis, difficulty urinating, pelvic pain and dyspareunia.  Musculoskeletal: Positive for back pain.       Objective:   Physical Exam  Constitutional: She appears well-developed and well-nourished. No distress.  Abdominal: Bowel sounds are normal. She exhibits no distension and no mass. There is no tenderness. There is no rebound and no guarding.          Assessment & Plan:  This is a UTI, either a new one or a recurrence of a partially treated one. Restart on Bactrim, drink lots of water, and we will culture it this time.

## 2011-01-06 ENCOUNTER — Telehealth: Payer: Self-pay | Admitting: Family Medicine

## 2011-01-06 NOTE — Telephone Encounter (Signed)
Pt notiifed 

## 2011-01-06 NOTE — Telephone Encounter (Signed)
Pt is req that she be called when her lab results come in and what the results are.

## 2011-01-08 NOTE — Progress Notes (Signed)
The sample was never sent for culture

## 2011-01-10 ENCOUNTER — Other Ambulatory Visit: Payer: Self-pay

## 2011-01-16 ENCOUNTER — Ambulatory Visit: Payer: Self-pay | Admitting: Family Medicine

## 2011-01-17 ENCOUNTER — Ambulatory Visit (INDEPENDENT_AMBULATORY_CARE_PROVIDER_SITE_OTHER): Payer: Self-pay | Admitting: Family Medicine

## 2011-01-17 ENCOUNTER — Emergency Department (HOSPITAL_COMMUNITY)
Admission: EM | Admit: 2011-01-17 | Discharge: 2011-01-18 | Disposition: A | Discharge status: Home / Self Care | Payer: Self-pay | Attending: Emergency Medicine | Admitting: Emergency Medicine

## 2011-01-17 ENCOUNTER — Encounter: Payer: Self-pay | Admitting: Family Medicine

## 2011-01-17 DIAGNOSIS — R109 Unspecified abdominal pain: Secondary | ICD-10-CM

## 2011-01-17 LAB — POCT URINALYSIS DIPSTICK
Ketones, UA: NEGATIVE
Leukocytes, UA: NEGATIVE
Nitrite, UA: NEGATIVE
Spec Grav, UA: 1.03
pH, UA: 5

## 2011-01-17 LAB — URINALYSIS, ROUTINE W REFLEX MICROSCOPIC
Nitrite: NEGATIVE
Specific Gravity, Urine: 1.044 — ABNORMAL HIGH (ref 1.005–1.030)
Urobilinogen, UA: 0.2 mg/dL (ref 0.0–1.0)

## 2011-01-17 LAB — URINE MICROSCOPIC-ADD ON

## 2011-01-17 MED ORDER — OXYCODONE-ACETAMINOPHEN 10-325 MG PO TABS: 1.0000 | ORAL_TABLET | Freq: Four times a day (QID) | ORAL | Status: DC | PRN

## 2011-01-17 MED ORDER — KETOROLAC TROMETHAMINE 60 MG/2ML IM SOLN
60.0000 mg | Freq: Once | INTRAMUSCULAR | Status: AC
  Administered 2011-01-17: 60 mg via INTRAMUSCULAR

## 2011-01-17 MED ORDER — ALPRAZOLAM 1 MG PO TABS: 1.0000 mg | ORAL_TABLET | Freq: Four times a day (QID) | ORAL | Status: DC

## 2011-01-17 MED ORDER — DICYCLOMINE HCL 10 MG PO CAPS: 10.0000 mg | ORAL_CAPSULE | Freq: Four times a day (QID) | ORAL | Status: AC | PRN

## 2011-01-17 NOTE — Progress Notes (Signed)
  Subjective:    Patient ID: Lynn Wallace, female    DOB: [birth year], 40 y.o.   MRN: [id number]  HPI Here for 4 days of lower abdominal cramps and passing lots of flatus. Several weeks ago she took a course of Bactrim for a UTI, and she thinks her cramps are a side effect of this. The UTI symptoms resolved quickly, but one week ago she developed lower abdominal cramps and diarrhea. Some nausea but no vomiting. No fever. The diarrhea stopped 3 days ago, but the cramps persist. Able to eat and drink.    Review of Systems  Constitutional: Negative.   Respiratory: Negative.   Cardiovascular: Negative.   Gastrointestinal: Positive for nausea, abdominal pain, diarrhea and abdominal distention. Negative for vomiting and constipation.       Objective:   Physical Exam  Constitutional: She appears well-developed and well-nourished.  Abdominal: Soft. Bowel sounds are normal. She exhibits no distension and no mass. There is tenderness. There is no rebound and no guarding.       Mild diffuse tenderness           Assessment & Plan:

## 2011-01-18 ENCOUNTER — Emergency Department (HOSPITAL_COMMUNITY): Payer: Self-pay

## 2011-01-18 LAB — COMPREHENSIVE METABOLIC PANEL
Albumin: 3.3 g/dL — ABNORMAL LOW (ref 3.5–5.2)
BUN: 21 mg/dL (ref 6–23)
Calcium: 8.9 mg/dL (ref 8.4–10.5)
Creatinine, Ser: 0.88 mg/dL (ref 0.4–1.2)
Potassium: 3.8 mEq/L (ref 3.5–5.1)
Total Protein: 6 g/dL (ref 6.0–8.3)

## 2011-01-18 LAB — DIFFERENTIAL
Eosinophils Absolute: 0.1 10*3/uL (ref 0.0–0.7)
Eosinophils Relative: 1 % (ref 0–5)
Lymphs Abs: 3.2 10*3/uL (ref 0.7–4.0)

## 2011-01-18 LAB — CBC
MCH: 32.2 pg (ref 26.0–34.0)
MCHC: 32.2 g/dL (ref 30.0–36.0)
MCV: 100.3 fL — ABNORMAL HIGH (ref 78.0–100.0)
Platelets: 230 10*3/uL (ref 150–400)
RDW: 13 % (ref 11.5–15.5)
WBC: 8.6 10*3/uL (ref 4.0–10.5)

## 2011-01-18 LAB — RAPID URINE DRUG SCREEN, HOSP PERFORMED
Benzodiazepines: POSITIVE — AB
Cocaine: NOT DETECTED

## 2011-01-18 LAB — PREGNANCY, URINE: Preg Test, Ur: NEGATIVE

## 2011-01-18 MED ORDER — IOHEXOL 300 MG/ML  SOLN
125.0000 mL | Freq: Once | INTRAMUSCULAR | Status: AC | PRN
  Administered 2011-01-18: 125 mL via INTRAVENOUS

## 2011-01-19 LAB — URINE CULTURE
Colony Count: 15000
Culture  Setup Time: 201202250410

## 2011-01-23 ENCOUNTER — Telehealth: Payer: Self-pay | Admitting: Family Medicine

## 2011-01-23 NOTE — Telephone Encounter (Signed)
Call this in to take one tsp q 4 hours prn cough, 240 ml with no rf

## 2011-01-23 NOTE — Telephone Encounter (Signed)
Pt called and has a cold with chest congestion. Pt is req a script for Hydromet 5-1.5mg /4ml syrup. Pt is req this to be called in to [facility]. Pls call pt when this has been done.

## 2011-01-24 ENCOUNTER — Telehealth: Payer: Self-pay | Admitting: Family Medicine

## 2011-01-24 MED ORDER — HYDROCODONE-HOMATROPINE 5-1.5 MG/5ML PO SYRP: 5.0000 mL | ORAL_SOLUTION | ORAL | Status: DC | PRN

## 2011-01-24 NOTE — Telephone Encounter (Signed)
Notified pt. 

## 2011-01-24 NOTE — Telephone Encounter (Signed)
Call in Hydromet one tsp q 4 hours prn cough, 240 ml with no rf 

## 2011-01-24 NOTE — Telephone Encounter (Signed)
Pt called to request that a Rx for cough med be sent to [facility].... Pt doesn't want to come in for an appt because she will be coming in next week for cpx.... Pts # [phone].

## 2011-01-24 NOTE — Telephone Encounter (Signed)
rx called in, pt aware 

## 2011-01-27 ENCOUNTER — Telehealth: Payer: Self-pay | Admitting: Family Medicine

## 2011-01-27 NOTE — Telephone Encounter (Signed)
Pt aware..... Acknowledged same..... 01/27/11 @ 3:13pm // rs

## 2011-01-27 NOTE — Telephone Encounter (Signed)
Pt called to adv that she wants meds changed.... Pt adv that she is in pain constantly and wants something different that she can be prescribed that will help till she can have an MRI.... Pt can come in today, declined to see another physician... Wants to know what to do?  Do you want me to put her on the schedule for tomorrow? Pts # [phone].

## 2011-01-27 NOTE — Telephone Encounter (Signed)
She is scheduled for a cpx this Wednesday. We can discuss this then

## 2011-01-29 ENCOUNTER — Encounter: Payer: Self-pay | Admitting: Family Medicine

## 2011-01-29 ENCOUNTER — Telehealth: Payer: Self-pay | Admitting: Family Medicine

## 2011-01-29 NOTE — Telephone Encounter (Signed)
Pt called at 1:30pm and said that she was suppose to come in for cpx this a.m., but couldn't come in because she was sick. Pt has rsc to 03/12/11 at 10am. Pt has been notified that she might be charged a ns fee because she did not give advance notice.

## 2011-01-30 NOTE — Telephone Encounter (Signed)
Noted  

## 2011-01-31 ENCOUNTER — Other Ambulatory Visit: Payer: Self-pay | Admitting: Family Medicine

## 2011-02-04 ENCOUNTER — Ambulatory Visit (INDEPENDENT_AMBULATORY_CARE_PROVIDER_SITE_OTHER): Payer: Self-pay | Admitting: Family Medicine

## 2011-02-04 ENCOUNTER — Encounter: Payer: Self-pay | Admitting: Family Medicine

## 2011-02-04 ENCOUNTER — Encounter (INDEPENDENT_AMBULATORY_CARE_PROVIDER_SITE_OTHER): Payer: Self-pay | Admitting: *Deleted

## 2011-02-04 DIAGNOSIS — R109 Unspecified abdominal pain: Secondary | ICD-10-CM

## 2011-02-04 MED ORDER — HYDROCODONE-HOMATROPINE 5-1.5 MG/5ML PO SYRP: 5.0000 mL | ORAL_SOLUTION | ORAL | Status: DC | PRN

## 2011-02-04 MED ORDER — OMEPRAZOLE 40 MG PO CPDR: 40.0000 mg | DELAYED_RELEASE_CAPSULE | Freq: Every day | ORAL | Status: DC

## 2011-02-04 NOTE — Progress Notes (Signed)
  Subjective:    Patient ID: Lynn Wallace, female    DOB: 26-Feb-1971, 40 y.o.   MRN: [id number]  HPI Here to follow up from an ER visit on 01-17-11 for lower abdominal cramps and diarrhea. No fever. It was felt that she had a UTI that day since there were some leukocytes in her urine. However a urine culture that day was negative. She was given 7 days of Cipro and she finished these. However she is no better, and she still has diarrhea with diffuse cramps. No nausea or fever. She does have some epigastric pain and heartburn as well, and she has been treated for GERD in the past. No urinary symptoms.    Review of Systems  Constitutional: Negative.   Gastrointestinal: Positive for abdominal pain and diarrhea. Negative for nausea, vomiting, constipation, blood in stool, abdominal distention, anal bleeding and rectal pain.       Objective:   Physical Exam  Constitutional: She appears well-developed and well-nourished.  Abdominal: Soft. She exhibits no distension and no mass. There is no rebound and no guarding.       Mild diffuse tenderness           Assessment & Plan:  She seems to have GERD and possibly duodenitis. Get back on daily PPIs and refer to GI soon.

## 2011-02-05 ENCOUNTER — Telehealth: Payer: Self-pay | Admitting: Family Medicine

## 2011-02-05 DIAGNOSIS — M25551 Pain in right hip: Secondary | ICD-10-CM

## 2011-02-05 DIAGNOSIS — M545 Low back pain, unspecified: Secondary | ICD-10-CM

## 2011-02-05 NOTE — Telephone Encounter (Signed)
Pt would like to have mri of back/hips due to back and hip pain. At [facility]

## 2011-02-05 NOTE — Telephone Encounter (Signed)
Would like to pick up her rx on Friday for Percocet. She will fill on next week. Please return call.

## 2011-02-06 NOTE — Telephone Encounter (Signed)
I ordered lumbar spine MRI and Xrays of her pelvis

## 2011-02-07 ENCOUNTER — Telehealth: Payer: Self-pay

## 2011-02-07 DIAGNOSIS — M545 Low back pain: Secondary | ICD-10-CM

## 2011-02-07 NOTE — Telephone Encounter (Signed)
Pt called regarding appt for MRI wants at [facility] Per Terri needs referral.

## 2011-02-07 NOTE — Telephone Encounter (Signed)
Pt called back to check on status of the percocet script she req on 02/05/11. Pls call pt when ready for pick up.

## 2011-02-07 NOTE — Telephone Encounter (Signed)
Pt called for MRI appt to [facility] and per Terri no referral found.

## 2011-02-10 LAB — COMPREHENSIVE METABOLIC PANEL
ALT: 11 U/L (ref 0–35)
AST: 27 U/L (ref 0–37)
Albumin: 4.3 g/dL (ref 3.5–5.2)
Alkaline Phosphatase: 71 U/L (ref 39–117)
BUN: 12 mg/dL (ref 6–23)
Chloride: 101 mEq/L (ref 96–112)
GFR calc Af Amer: >60 mL/min (ref >60)
Potassium: 3.6 mEq/L (ref 3.5–5.1)
Sodium: 135 mEq/L (ref 135–145)
Total Bilirubin: 1.1 mg/dL (ref 0.3–1.2)
Total Protein: 7.4 g/dL (ref 6.0–8.3)

## 2011-02-10 LAB — CBC
Platelets: 277 10*3/uL (ref 150–400)
RDW: 13.3 % (ref 11.5–15.5)
WBC: 11.3 10*3/uL — ABNORMAL HIGH (ref 4.0–10.5)

## 2011-02-10 LAB — URINALYSIS, ROUTINE W REFLEX MICROSCOPIC
Hgb urine dipstick: NEGATIVE
Ketones, ur: >80 mg/dL — AB
Specific Gravity, Urine: 1.028 (ref 1.005–1.030)
Urobilinogen, UA: 1 mg/dL (ref 0.0–1.0)

## 2011-02-10 LAB — DIFFERENTIAL
Basophils Relative: 0 % (ref 0–1)
Eosinophils Relative: 0 % (ref 0–5)
Monocytes Absolute: 0.9 10*3/uL (ref 0.1–1.0)
Monocytes Relative: 8 % (ref 3–12)
Neutro Abs: 8.3 10*3/uL — ABNORMAL HIGH (ref 1.7–7.7)

## 2011-02-10 LAB — URINE CULTURE: Colony Count: 50000

## 2011-02-10 LAB — URINE MICROSCOPIC-ADD ON

## 2011-02-10 LAB — ETHANOL: Alcohol, Ethyl (B): <5 mg/dL (ref 0–10)

## 2011-02-10 LAB — GLUCOSE, CAPILLARY: Glucose-Capillary: 53 mg/dL — ABNORMAL LOW (ref 70–99)

## 2011-02-10 LAB — RAPID URINE DRUG SCREEN, HOSP PERFORMED
Barbiturates: NOT DETECTED
Opiates: POSITIVE — AB

## 2011-02-11 NOTE — Letter (Signed)
Summary: New Patient letter  [facility]  [address] [place], [place] [address]   Phone: [phone]  Fax: [phone]       02/04/2011 MRN: [id number]  Lynn Wallace [address] [place], [place]  [address]  [place]  Dear Lynn Wallace,  Welcome to the Gastroenterology Division at [facility].    You are scheduled to see Dr.  Russella Dar on 03-18-11 at 10:15A.M. on the 3rd floor at [facility], [address].  We ask that you try to arrive at our office 15 minutes prior to your appointment time to allow for check-in.  We would like you to complete the enclosed self-administered evaluation form prior to your visit and bring it with you on the day of your appointment.  We will review it with you.  Also, please bring a complete list of all your medications or, if you prefer, bring the medication bottles and we will list them.  Please bring your insurance card so that we may make a copy of it.  If your insurance requires a referral to see a specialist, please bring your referral form from your primary care physician.  Co-payments are due at the time of your visit and may be paid by cash, check or credit card.     Your office visit will consist of a consult with your physician (includes a physical exam), any laboratory testing he/she may order, scheduling of any necessary diagnostic testing (e.g. x-ray, ultrasound, CT-scan), and scheduling of a procedure (e.g. Endoscopy, Colonoscopy) if required.  Please allow enough time on your schedule to allow for any/all of these possibilities.    If you cannot keep your appointment, please call [phone] to cancel or reschedule prior to your appointment date.  This allows [place] the opportunity to schedule an appointment for another patient in need of care.  If you do not cancel or reschedule by 5 p.m. the business day prior to your appointment date, you will be charged a $50.00 late cancellation/no-show fee.    Thank you for  choosing [facility] for your medical needs.  We appreciate the opportunity to care for you.  Please visit [place] at our website  to learn more about our practice.                     Sincerely,                                                             The Gastroenterology Division

## 2011-02-12 ENCOUNTER — Emergency Department (HOSPITAL_COMMUNITY)
Admission: EM | Admit: 2011-02-12 | Discharge: 2011-02-13 | Disposition: A | Discharge status: Home / Self Care | Payer: Self-pay | Attending: Emergency Medicine | Admitting: Emergency Medicine

## 2011-02-12 DIAGNOSIS — R197 Diarrhea, unspecified: Secondary | ICD-10-CM | POA: Insufficient documentation

## 2011-02-12 DIAGNOSIS — R109 Unspecified abdominal pain: Secondary | ICD-10-CM | POA: Insufficient documentation

## 2011-02-12 DIAGNOSIS — N390 Urinary tract infection, site not specified: Secondary | ICD-10-CM | POA: Insufficient documentation

## 2011-02-12 LAB — URINALYSIS, ROUTINE W REFLEX MICROSCOPIC
Glucose, UA: NEGATIVE mg/dL
Glucose, UA: NEGATIVE mg/dL
Hgb urine dipstick: NEGATIVE
Ketones, ur: NEGATIVE mg/dL
Protein, ur: NEGATIVE mg/dL
pH: 5 (ref 5.0–8.0)

## 2011-02-12 LAB — CBC
Hemoglobin: 14.1 g/dL (ref 12.0–15.0)
MCH: 32.9 pg (ref 26.0–34.0)
MCHC: 33 g/dL (ref 30.0–36.0)

## 2011-02-12 LAB — COMPREHENSIVE METABOLIC PANEL
ALT: 12 U/L (ref 0–35)
AST: 17 U/L (ref 0–37)
Albumin: 3.7 g/dL (ref 3.5–5.2)
CO2: 29 mEq/L (ref 19–32)
Calcium: 9.3 mg/dL (ref 8.4–10.5)
GFR calc Af Amer: >60 mL/min (ref >60)
Sodium: 140 mEq/L (ref 135–145)
Total Protein: 7.2 g/dL (ref 6.0–8.3)

## 2011-02-12 LAB — DIFFERENTIAL
Basophils Relative: 0 % (ref 0–1)
Eosinophils Absolute: 0.1 10*3/uL (ref 0.0–0.7)
Eosinophils Relative: 1 % (ref 0–5)
Monocytes Absolute: 0.8 10*3/uL (ref 0.1–1.0)
Monocytes Relative: 11 % (ref 3–12)
Neutro Abs: 3.5 10*3/uL (ref 1.7–7.7)

## 2011-02-12 LAB — URINE CULTURE: Colony Count: 40000

## 2011-02-12 LAB — URINE MICROSCOPIC-ADD ON

## 2011-02-12 NOTE — Telephone Encounter (Signed)
Pt called back saying that she is having a lot of pain and has diarrhea. Pt would like to have MRI or x-ray done at [facility]. Pt is going to the ED for the pain and diarrhea. I offered her an appt for tomorrow with Dr. Clent Ridges but pt stated that she was in so much pain she is going to have to go to the ED. I didn't make the appt since she is going to the ED.

## 2011-02-13 MED ORDER — OXYCODONE-ACETAMINOPHEN 10-325 MG PO TABS
1.0000 | ORAL_TABLET | Freq: Four times a day (QID) | ORAL | Status: DC | PRN
Start: 2011-02-13 — End: 2011-03-12

## 2011-02-13 NOTE — Telephone Encounter (Signed)
Dr Clent Ridges aware  Terri working on referrral   Pt aware of referral and rx ready for pick up

## 2011-02-20 ENCOUNTER — Telehealth: Payer: Self-pay | Admitting: Family Medicine

## 2011-02-20 ENCOUNTER — Other Ambulatory Visit (HOSPITAL_COMMUNITY): Payer: Self-pay

## 2011-02-20 ENCOUNTER — Ambulatory Visit (HOSPITAL_COMMUNITY)
Admission: RE | Admit: 2011-02-20 | Discharge: 2011-02-20 | Disposition: A | Discharge status: Home / Self Care | Payer: Self-pay | Source: Ambulatory Visit | Attending: Family Medicine | Admitting: Family Medicine

## 2011-02-20 DIAGNOSIS — M545 Low back pain, unspecified: Secondary | ICD-10-CM

## 2011-02-20 NOTE — Telephone Encounter (Signed)
Pt would like mri of back results at [facility]

## 2011-03-03 ENCOUNTER — Encounter: Payer: Self-pay | Admitting: Family Medicine

## 2011-03-03 ENCOUNTER — Ambulatory Visit (INDEPENDENT_AMBULATORY_CARE_PROVIDER_SITE_OTHER): Payer: Self-pay | Admitting: Family Medicine

## 2011-03-03 VITALS — BP 110/64 | HR 100 | Temp 98.4°F | Wt 197.0 lb

## 2011-03-03 DIAGNOSIS — A084 Viral intestinal infection, unspecified: Secondary | ICD-10-CM

## 2011-03-03 DIAGNOSIS — A088 Other specified intestinal infections: Secondary | ICD-10-CM

## 2011-03-03 LAB — DIFFERENTIAL
Basophils Absolute: 0 10*3/uL (ref 0.0–0.1)
Basophils Relative: 1 % (ref 0–1)
Eosinophils Absolute: 0.1 10*3/uL (ref 0.0–0.7)
Eosinophils Relative: 2 % (ref 0–5)
Lymphocytes Relative: 42 % (ref 12–46)
Lymphs Abs: 2.7 10*3/uL (ref 0.7–4.0)
Monocytes Absolute: 0.8 10*3/uL (ref 0.1–1.0)
Monocytes Relative: 11 % (ref 3–12)
Neutro Abs: 2.9 10*3/uL (ref 1.7–7.7)
Neutrophils Relative %: 45 % (ref 43–77)

## 2011-03-03 LAB — CBC
HCT: 37.1 % (ref 36.0–46.0)
MCV: 96.7 fL (ref 78.0–100.0)
Platelets: 217 10*3/uL (ref 150–400)
RDW: 13.2 % (ref 11.5–15.5)

## 2011-03-03 LAB — BASIC METABOLIC PANEL
BUN: 7 mg/dL (ref 6–23)
Chloride: 105 mEq/L (ref 96–112)
Glucose, Bld: 102 mg/dL — ABNORMAL HIGH (ref 70–99)
Potassium: 3.4 mEq/L — ABNORMAL LOW (ref 3.5–5.1)

## 2011-03-03 LAB — POCT CARDIAC MARKERS
CKMB, poc: <1 ng/mL — ABNORMAL LOW (ref 1.0–8.0)
Myoglobin, poc: 56.2 ng/mL (ref 12–200)
Troponin i, poc: <0.05 ng/mL (ref 0.00–0.09)

## 2011-03-03 LAB — RAPID URINE DRUG SCREEN, HOSP PERFORMED
Amphetamines: NOT DETECTED
Barbiturates: NOT DETECTED
Opiates: POSITIVE — AB
Tetrahydrocannabinol: POSITIVE — AB

## 2011-03-03 MED ORDER — PROMETHAZINE HCL 50 MG/ML IJ SOLN
50.0000 mg | INTRAMUSCULAR | Status: AC
  Administered 2011-03-03: 50 mg via INTRAMUSCULAR

## 2011-03-03 MED ORDER — PROMETHAZINE HCL 25 MG PO TABS: 25.0000 mg | ORAL_TABLET | Freq: Four times a day (QID) | ORAL | Status: DC | PRN

## 2011-03-03 NOTE — Progress Notes (Signed)
  Subjective:    Patient ID: Lynn Wallace, female    DOB: May 15, 1971, 40 y.o.   MRN: [id number]  HPI Here for the onset this morning of nausea and vomiting, epigastric pains, and diarrhea. No fever.    Review of Systems  Constitutional: Negative.   Respiratory: Negative.   Cardiovascular: Negative.   Gastrointestinal: Positive for nausea, vomiting, abdominal pain and diarrhea.       Objective:   Physical Exam  Constitutional: She appears well-developed and well-nourished.  Abdominal: Soft. Bowel sounds are normal. She exhibits no distension and no mass. There is no rebound and no guarding.       Mild epigastric tenderness           Assessment & Plan:  Rest, sip fluids. Phenergan for nausea  and Imodium AD for diarrhea.

## 2011-03-05 ENCOUNTER — Telehealth: Payer: Self-pay | Admitting: Family Medicine

## 2011-03-05 NOTE — Telephone Encounter (Signed)
Refill Percocet. Patient knows that Dr Clent Ridges is out of office until Monday. Please call if ready before then.

## 2011-03-10 ENCOUNTER — Telehealth: Payer: Self-pay | Admitting: Family Medicine

## 2011-03-10 NOTE — Telephone Encounter (Signed)
Pt needs new rx percoet 10-325mg . Pt has ov sch for 03-12-2011

## 2011-03-10 NOTE — Telephone Encounter (Signed)
dupicate request

## 2011-03-10 NOTE — Telephone Encounter (Signed)
Pt aware.

## 2011-03-10 NOTE — Telephone Encounter (Signed)
No it is too early. May refill on 03-16-11

## 2011-03-11 NOTE — Telephone Encounter (Signed)
Wait for tomorrow

## 2011-03-12 ENCOUNTER — Other Ambulatory Visit (HOSPITAL_COMMUNITY)
Admission: RE | Admit: 2011-03-12 | Discharge: 2011-03-12 | Disposition: A | Discharge status: Home / Self Care | Payer: Self-pay | Source: Ambulatory Visit | Attending: Family Medicine | Admitting: Family Medicine

## 2011-03-12 ENCOUNTER — Ambulatory Visit (INDEPENDENT_AMBULATORY_CARE_PROVIDER_SITE_OTHER): Payer: Self-pay | Admitting: Family Medicine

## 2011-03-12 ENCOUNTER — Encounter: Payer: Self-pay | Admitting: Family Medicine

## 2011-03-12 VITALS — BP 130/80 | HR 64 | Ht 63.0 in | Wt 202.0 lb

## 2011-03-12 DIAGNOSIS — Z01419 Encounter for gynecological examination (general) (routine) without abnormal findings: Secondary | ICD-10-CM | POA: Insufficient documentation

## 2011-03-12 DIAGNOSIS — Z0000 Encounter for general adult medical examination without abnormal findings: Secondary | ICD-10-CM

## 2011-03-12 MED ORDER — OXYCODONE-ACETAMINOPHEN 10-325 MG PO TABS: 1.0000 | ORAL_TABLET | Freq: Four times a day (QID) | ORAL | Status: DC | PRN

## 2011-03-12 NOTE — Progress Notes (Signed)
Subjective:    Patient ID: Lynn Wallace, female    DOB: Aug 29, 1971, 40 y.o.   MRN: [id number]  HPI 40 yr old female for a cpx. She feels well in general other than her usual back pains. She has never had a mammogram.    Review of Systems  Constitutional: Negative.  Negative for fever, diaphoresis, activity change, appetite change, fatigue and unexpected weight change.  HENT: Negative.  Negative for hearing loss, ear pain, nosebleeds, congestion, sore throat, trouble swallowing, neck pain, neck stiffness, voice change and tinnitus.   Eyes: Negative.  Negative for photophobia, pain, discharge, redness and visual disturbance.  Respiratory: Negative.  Negative for apnea, cough, choking, chest tightness, shortness of breath, wheezing and stridor.   Cardiovascular: Negative.  Negative for chest pain, palpitations and leg swelling.  Gastrointestinal: Negative.  Negative for nausea, vomiting, abdominal pain, diarrhea, constipation, blood in stool, abdominal distention and rectal pain.  Genitourinary: Negative.  Negative for dysuria, urgency, frequency, hematuria, flank pain, scrotal swelling, vaginal bleeding, vaginal discharge, enuresis, difficulty urinating, testicular pain, vaginal pain and menstrual problem.  Musculoskeletal: Negative.  Negative for myalgias, back pain, joint swelling, arthralgias and gait problem.  Skin: Negative.  Negative for color change, pallor, rash and wound.  Neurological: Negative.  Negative for dizziness, tremors, seizures, syncope, speech difficulty, weakness, light-headedness, numbness and headaches.  Hematological: Negative.  Negative for adenopathy. Does not bruise/bleed easily.  Psychiatric/Behavioral: Negative.  Negative for hallucinations, behavioral problems, confusion, sleep disturbance, dysphoric mood and agitation. The patient is not nervous/anxious.        Objective:   Physical Exam  Constitutional: She appears well-developed and well-nourished. No  distress.  HENT:  Head: Normocephalic and atraumatic.  Right Ear: External ear normal.  Left Ear: External ear normal.  Nose: Nose normal.  Mouth/Throat: Oropharynx is clear and moist. No oropharyngeal exudate.  Eyes: Conjunctivae and EOM are normal. Pupils are equal, round, and reactive to light. Right eye exhibits no discharge. Left eye exhibits no discharge. No scleral icterus.  Neck: Normal range of motion. Neck supple. No JVD present. No thyromegaly present.  Cardiovascular: Normal rate, regular rhythm, normal heart sounds and intact distal pulses.  Exam reveals no gallop and no friction rub.   No murmur heard. Pulmonary/Chest: Effort normal and breath sounds normal. No stridor. No respiratory distress. She has no wheezes. She has no rales. She exhibits no tenderness.  Abdominal: Soft. Normal appearance and bowel sounds are normal. She exhibits no distension, no abdominal bruit, no ascites and no mass. There is no hepatosplenomegaly. There is no tenderness. There is no rigidity, no rebound and no guarding. No hernia.  Genitourinary: Rectum normal, vagina normal and uterus normal. No breast swelling, tenderness, discharge or bleeding. Cervix exhibits no motion tenderness, no discharge and no friability. Right adnexum displays no mass, no tenderness and no fullness. Left adnexum displays no mass, no tenderness and no fullness. No erythema, tenderness or bleeding around the vagina. No vaginal discharge found.  Musculoskeletal: Normal range of motion. She exhibits no edema and no tenderness.  Lymphadenopathy:    She has no cervical adenopathy.  Neurological: She is alert. She has normal reflexes. No cranial nerve deficit. She exhibits normal muscle tone. Coordination normal.  Skin: Skin is warm and dry. No rash noted. She is not diaphoretic. No erythema. No pallor.  Psychiatric: She has a normal mood and affect. Her behavior is normal. Judgment and thought content normal.           Assessment &  Plan:  Set up fasting labs soon. She will arrange for a mammogram soon.

## 2011-03-17 ENCOUNTER — Telehealth: Payer: Self-pay | Admitting: Family Medicine

## 2011-03-17 NOTE — Telephone Encounter (Signed)
Message copied by Burnard Leigh on Mon Mar 17, 2011 10:52 AM ------      Message from: Dwaine Deter      Created: Mon Mar 17, 2011  8:27 AM       Normal, repeat one year

## 2011-03-18 ENCOUNTER — Other Ambulatory Visit: Payer: Self-pay

## 2011-03-18 ENCOUNTER — Ambulatory Visit (INDEPENDENT_AMBULATORY_CARE_PROVIDER_SITE_OTHER): Payer: Self-pay | Admitting: Gastroenterology

## 2011-03-18 ENCOUNTER — Encounter: Payer: Self-pay | Admitting: Gastroenterology

## 2011-03-18 VITALS — BP 110/72 | HR 80 | Ht 63.0 in | Wt 206.0 lb

## 2011-03-18 DIAGNOSIS — R1084 Generalized abdominal pain: Secondary | ICD-10-CM

## 2011-03-18 DIAGNOSIS — K5900 Constipation, unspecified: Secondary | ICD-10-CM

## 2011-03-18 DIAGNOSIS — R198 Other specified symptoms and signs involving the digestive system and abdomen: Secondary | ICD-10-CM

## 2011-03-18 DIAGNOSIS — R1319 Other dysphagia: Secondary | ICD-10-CM

## 2011-03-18 MED ORDER — PEG-KCL-NACL-NASULF-NA ASC-C 100 G PO SOLR: 1.0000 | Freq: Once | ORAL | Status: AC

## 2011-03-18 NOTE — Patient Instructions (Addendum)
You have been scheduled for a Upper Endoscopy/ Savary and Colonoscopy at [facility]. Separate instruction sheet given. Your prep has been sent to your pharmacy.  Cc: Gershon Crane, MD

## 2011-03-18 NOTE — Progress Notes (Signed)
History of Present Illness: This is a 40 year old female here today with her husband. She is several gastrointestinal complaints and the most bothersome are constipation, intermittent lower abdominal pain, difficulty swallowing solids and belching. She relates six-month history of intermittent difficulties with frequent belching and occasionally difficulties swallowing solid foods. She was treated with omeprazole for a period of time but has since discontinued this medication. She also notes a six-month history of small pellet-like stools and intermittent crampy lower nominal pain. She was recommended to start taking MiraLax daily but has not done so on a regular basis. She takes hydrocodone for chronic back pain and has a history of polysubstance abuse including narcotics, cocaine, and benzodiazepines and marijuana. She denies any rectal bleeding, weight loss, change in stool caliber, melena, hematochezia.  Review of Systems: Anxiety, back pain, breast changes, visual changes, headaches, menstrual pain, shortness of breath, swelling of feet and legs and increased urinary frequency. Pertinent positive and negative review of systems were noted in the above HPI section. All other review of systems were otherwise negative.  Current Medications, Allergies, Past Medical History, Past Surgical History, Family History and Social History were reviewed in [employer] record.  Physical Exam: General: Well developed , well nourished, no acute distress. Heavy cigarette are in the exam room. Head: Normocephalic and atraumatic Eyes:  sclerae anicteric, EOMI Ears: Normal auditory acuity Mouth: No deformity or lesions Neck: Supple, no masses or thyromegaly Lungs: Clear throughout to auscultation Heart: Regular rate and rhythm; no murmurs, rubs or bruits Abdomen: Soft, minimal lower tenderness, without rebound or guarding, and non distended. No masses, hepatosplenomegaly or hernias noted. Normal  bowel sounds Rectal: Deferred to colonoscopy Musculoskeletal: Symmetrical with no gross deformities  Skin: No lesions on visible extremities Pulses:  Normal pulses noted Extremities: No clubbing, cyanosis, edema or deformities noted Neurological: Alert oriented x 4, grossly nonfocal Cervical Nodes:  No significant cervical adenopathy Inguinal Nodes: No significant inguinal adenopathy Psychological:  Alert and cooperative. Normal mood and affect  Assessment and Recommendations:  1. Dysphagia with presumed underlying GERD. Rule out a peptic stricture or neoplasm. Begin standard antireflux measures and omeprazole 40 mg every morning. The risks, benefits, and alternatives to endoscopy with possible biopsy and possible dilation were discussed with the patient and they consent to proceed.   2. Change in bowel habits. Constipation with lower abdominal pain. Rule out obstructing lesions and colorectal neoplasms. I suspect her constipation is related to chronic narcotic usage and/or functional constipation with crampy abdominal pain. The risks, benefits, and alternatives to colonoscopy with possible biopsy and possible polypectomy were discussed with the patient and they consent to proceed.

## 2011-03-24 ENCOUNTER — Telehealth: Payer: Self-pay | Admitting: Gastroenterology

## 2011-03-24 NOTE — Telephone Encounter (Signed)
Called pt with no answer.

## 2011-03-24 NOTE — Telephone Encounter (Signed)
Spoke with patient and she cannot afford the Movi prep and wanted to know if we have a sample of the prep. Informed her I can leave a prep at the front desk for her to pick up. Pt agreed and will come by today.

## 2011-03-25 ENCOUNTER — Telehealth: Payer: Self-pay | Admitting: Gastroenterology

## 2011-03-25 NOTE — Telephone Encounter (Signed)
Patient advised that she needs to throw away the mixed moviprep.  She will come and pick up a moviprep .  I have reviewed that she is scheduled for 04/01/11

## 2011-03-27 ENCOUNTER — Emergency Department (HOSPITAL_COMMUNITY)
Admission: EM | Admit: 2011-03-27 | Discharge: 2011-03-27 | Disposition: A | Discharge status: Home / Self Care | Payer: No Typology Code available for payment source | Attending: Emergency Medicine | Admitting: Emergency Medicine

## 2011-03-27 DIAGNOSIS — Y92410 Unspecified street and highway as the place of occurrence of the external cause: Secondary | ICD-10-CM | POA: Insufficient documentation

## 2011-03-27 DIAGNOSIS — T148XXA Other injury of unspecified body region, initial encounter: Secondary | ICD-10-CM | POA: Insufficient documentation

## 2011-03-31 ENCOUNTER — Ambulatory Visit (INDEPENDENT_AMBULATORY_CARE_PROVIDER_SITE_OTHER): Payer: No Typology Code available for payment source | Admitting: Family Medicine

## 2011-03-31 ENCOUNTER — Telehealth: Payer: Self-pay | Admitting: Gastroenterology

## 2011-03-31 ENCOUNTER — Encounter: Payer: Self-pay | Admitting: Gastroenterology

## 2011-03-31 ENCOUNTER — Encounter: Payer: Self-pay | Admitting: Family Medicine

## 2011-03-31 VITALS — BP 120/72 | HR 67 | Temp 98.2°F | Resp 16 | Wt 198.0 lb

## 2011-03-31 DIAGNOSIS — M542 Cervicalgia: Secondary | ICD-10-CM
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
## 2011-03-31 NOTE — Telephone Encounter (Signed)
Patient stating she had nuts on her ice cream last evening. Patient is on clear liquids today. Questioned whether she can have 7 up. Reviewed clear liquid diet. Patient verbalized understanding. Patient is going to mix up her prep and place in refrig.for this evening.

## 2011-04-01 ENCOUNTER — Encounter: Payer: Self-pay | Admitting: Gastroenterology

## 2011-04-07 ENCOUNTER — Ambulatory Visit (AMBULATORY_SURGERY_CENTER): Payer: No Typology Code available for payment source | Admitting: *Deleted

## 2011-04-07 ENCOUNTER — Telehealth: Payer: Self-pay | Admitting: Family Medicine

## 2011-04-07 VITALS — Ht 63.0 in | Wt 204.4 lb

## 2011-04-07 DIAGNOSIS — K5900 Constipation, unspecified: Secondary | ICD-10-CM

## 2011-04-07 DIAGNOSIS — R1310 Dysphagia, unspecified: Secondary | ICD-10-CM

## 2011-04-07 DIAGNOSIS — R1084 Generalized abdominal pain: Secondary | ICD-10-CM

## 2011-04-07 DIAGNOSIS — R198 Other specified symptoms and signs involving the digestive system and abdomen: Secondary | ICD-10-CM

## 2011-04-07 NOTE — Progress Notes (Signed)
Patient states she received moviprep from office. Did not send prep in for her today!

## 2011-04-07 NOTE — Telephone Encounter (Signed)
Pt came by to pick up script for Percocet 10 mg. Pt said that script can be written to fill on Friday 04/11/11. Pt wants to pick script up while she is in town.

## 2011-04-07 NOTE — Telephone Encounter (Signed)
Pt called req refill of Percocet 10 mg. Pt req to pick this up today around 2:30pm

## 2011-04-08 MED ORDER — OXYCODONE-ACETAMINOPHEN 10-325 MG PO TABS: 1.0000 | ORAL_TABLET | Freq: Four times a day (QID) | ORAL | Status: DC | PRN

## 2011-04-08 NOTE — Telephone Encounter (Signed)
Done, in your basket

## 2011-04-08 NOTE — Telephone Encounter (Signed)
Pt request for refill on Percocet. See attached note form 04/07/11.

## 2011-04-09 ENCOUNTER — Ambulatory Visit: Payer: No Typology Code available for payment source

## 2011-04-09 NOTE — Telephone Encounter (Signed)
Pt is aware rx up front °

## 2011-04-11 NOTE — Op Note (Signed)
[facility]  Patient:    Lynn Wallace, Lynn Wallace Visit Number: [id number] MRN: [id number]          Service Type: OBV Location: [id number] Attending Physician:  Miguel Aschoff Dictated by:   Miguel Aschoff, M.D. Proc. Date: 05/30/02 Admit Date:  05/29/2002 Discharge Date: 05/30/2002                             Operative Report  PREOPERATIVE DIAGNOSES:       Probable right ectopic pregnancy with hemoperitoneum.  Left ovarian cyst.  POSTOPERATIVE DIAGNOSES:      Right distal ampullary ectopic pregnancy with left ovarian cyst.  Hemoperitoneum.  OPERATION:                    Diagnostic laparoscopy with right distal salpingectomy.  Left tubal sterilization.  Evacuation of hemoperitoneum.  SURGEON:                      Miguel Aschoff, M.D.  ANESTHESIA:                   General.  COMPLICATIONS:                None.  JUSTIFICATION:                The patient is a 40 year old white female, gravida 7, para 5-0-1-5.  Last menstrual period being in May 2003.  The patient presented to the [facility] complaining of lower abdominal pain, with an unusual prior period in June.  Ultrasound was carried out and revealed what appeared to be a hemoperitoneum, with findings very suggestive of a right ectopic pregnancy and left ovarian cyst.  Due to the patients clinical status, abdominal findings and ultrasound findings, she is being taken to the operating room to undergo diagnostic laparoscopy.  In addition, the patient has requested sterilization being performed.  The risks, benefits of the procedure have been discussed with the patient.  DESCRIPTION OF PROCEDURE:     The patient was taken to the operating room and placed in a supine position.  General anesthesia was administered without difficulty.  She was then placed in dorsal lithotomy position and prepped and draped in the usual sterile fashion.  The bladder was catheterized.  A small infraumbilical incision was  made.  A Veress needle was inserted and the abdomen was insufflated with 3 L of CO2.  Following the insufflation, the laparoscope was placed via the laparoscopic trocar.  Inspection at this point revealed obvious blood was in the abdominal cavity.  At this point a 5 mm second suprapubic port was established in direct visualization, as well as a 10 mm right lower quadrant port.  Inspection revealed the uterus to be anterior, normal size and shape.  The left ovary was noted to have a cyst present, approximately 5-6 cm in size and was smooth.  The left tube was unremarkable.  The right tube had a pisiforme mass at the distal end, consistent with an ectopic pregnancy with blood coming out at the distal end of the right tube.  At this point the tube was elevated and the mesosalpinx was cauterized with bipolar cautery, until the mesosalpinx was entirely cauterized.  This isolated the distal ectopic pregnancy and then the mesosalpinx was cut.  This portion was excised without difficulty and was then held.  Then the mid portion of the left tube was  grasped, polarized in its mid portion and divided with the laparoscopic scissors.  At this point the ectopic pregnancy was placed in an Endocatch bag, and pulled through the right lower quadrant incision.  Final inspection was made for hemostasis.  Hemostasis appeared to be excellent.  The hemoperitoneum was completely evacuated from the pelvis, and at this point the procedure was completed.  The CO2 was allowed to escape. The incisions were then closed using subcuticular 4-0 Vicryl at the umbilicus and the suprapubic port.  The fascia and the right lower quadrant was closed using a 0 Vicryl suture on a UR6 needle.  After the fascia was closed subcutaneous tissue was closed using a single-end directed 0 Vicryl suture, and then the skin was closed using subcuticular 4-0 Vicryl suture.  The port sites were then injected with 0.25% Marcaine.  The procedure  was completed without difficulty.  At this point, the patient was reversed from the anesthetic and taken to the recovery room in satisfactory condition.  Her blood type is noted to be O positive.  The patient will be observed overnight and sent home in the morning of May 30, 2002 if in satisfactory condition. Dictated by:   Miguel Aschoff, M.D. Attending Physician:  Miguel Aschoff DD:  05/30/02 TD:  06/01/02 Job: [id number]

## 2011-04-11 NOTE — H&P (Signed)
NAMEBRITTANNIE, TAWNEY           ACCOUNT NO.:  [id number]   MEDICAL RECORD NO.:  [id number]          PATIENT TYPE:  EMS   LOCATION:  ED                            FACILITY:  [facility]   PHYSICIAN:  Margaretmary Dys, M.D.DATE OF BIRTH:  [birth year]   DATE OF ADMISSION:  12/30/2005  DATE OF DISCHARGE:  LH                                HISTORY & PHYSICAL   PRIMARY CARE PHYSICIAN:  Unassigned.   ADMITTING DIAGNOSES:  1.  Altered mental status.  2.  Possible drug overdose.  3.  Hypokalemia.  4.  Polysubstance abuse.  5.  History of depression.   CHIEF COMPLAINT:  Altered mental status.   HISTORY OF PRESENT ILLNESS:  Ms. Ardyth Harps is a 40 year old Caucasian female  with a history of severe depression and polysubstance abuse in the past who  was brought into the emergency room by the EMS after they were called by a  friend.  A friend noticed that she was very lethargic and deeply sleepy some  time this afternoon.  The patient reports taking a new kind of medication.  She was not sure of the name but says it starts with an Im she is not sure  if it is Imitrex, but she took about four pills and also took one tablet of  Vicodin.  The patient says she did not intentionally overdose.  She has  never had any prior history of prior suicide.  In the emergency room she was  found to be lethargic, drowsy.  She was also slightly hypotensive.  She  received IV fluid bolus with improvement in her blood pressure.   The patient is being admitted now for management overnight as she is quite  lethargic.   REVIEW OF SYSTEMS:  A 10-point review of systems is otherwise negative  except as mentioned in the History of Present Illness.  The patient says she  was doing fine prior to this and had no complaints.   PAST MEDICAL HISTORY:  1.  _____ cellulitis with Methicillin-resistant Staphylococcus aureus.  2.  Anxiety.  3.  History of depression.  4.  History of polysubstance abuse.   PAST SURGICAL  HISTORY:  Status post ectopic pregnancy in 2004 followed by  tubal ligation.   MEDICATIONS:  It is unclear if she has any prescription medications she is  supposed to be on.   ALLERGIES:  PENICILLIN which gives her hives.   SOCIAL HISTORY:  She smokes about 1-1/2 pack of cigarettes a day, has a  close to 40-pack-year history of smoking.  She denies any significant  alcohol use.  She smokes marijuana and smoked a little more than she  normally does today.  She also had a little crack cocaine today.  She lives  alone and sometimes with her boyfriend.   FAMILY HISTORY:  Noncontributory.   PHYSICAL EXAMINATION:  The patient was lethargic but arousable, was able to  carry on with a conversation.  She seems to be looking around.  The patient  was poorly kempt.  VITAL SIGNS:  Blood pressure 102/65, pulse 80, respirations 18, temperature  98, oxygen saturation 99% on room  air.  HEENT:  Normocephalic, atraumatic.  Mucous membranes of oropharynx moist.  No exudates were noted.  Pupils are equal, round and reactive to light and  accommodation.  NECK:  Supple, no JVD, lymphadenopathy.  LUNGS:  Clear to auscultation with good air entry bilaterally.  HEART:  S1/S2, regular, no S3/S4, no murmur, gallops or rubs.  ABDOMEN:  Soft, nontender, bowel sounds positive.  EXTREMITIES:  No edema.  NEUROLOGIC:  The patient was lethargic.  No focal neurologic deficits.  Pupils were equal and reactive to light on focal exam.   LABORATORY DATA:  White blood cell count was 5.5, hemoglobin 12.4,  hematocrit 36.2, platelet count 240,000, no left shift.  Sodium 139,  potassium 3.4, chloride 106, CO2 28, glucose 97, BUN 8, creatinine 0.5.  Liver function test normal.  Urine pregnancy test negative.  Urine  toxicology was positive for benzodiazepines, cocaine, and marijuana.  Alcohol level was less than 5.  A 12-lead EKG shows normal sinus rhythm with  no acute ST-T changes.   ASSESSMENT AND PLAN:  Ms.  Lily Peer is a 39 year old Caucasian female who  presents to the emergency room with altered mental status.  She was brought  in by the EMS after her friend called  911.  It appears that her current  mental status which has improved since she got here is more related to her  polysubstance abuse including cocaine, marijuana and benzodiazepine use.  She denies any intentional overdose.  She is not interested in having any  detox at this time.  The patient was actually suggesting that she be allowed  to go home.  I told her that since she was quite altered in terms of her  mental status it might be reasonable to observe her overnight.  The patient  agreed to this plan.   We will repeat the CBC and BMP in the morning.  We will replace her  potassium through the IV.   Code status:  She is a full code.      Margaretmary Dys, M.D.  Electronically Signed     AM/MEDQ  D:  12/30/2005  T:  12/30/2005  Job:  [id number]

## 2011-04-11 NOTE — Consult Note (Signed)
NAMESTEFANNIE, Lynn Wallace              ACCOUNT NO.:  [id number]   MEDICAL RECORD NO.:  [id number]          PATIENT TYPE:  INP   LOCATION:  A322                          FACILITY:  [facility]   PHYSICIAN:  Zola Button T. Lazarus Salines, M.D. DATE OF BIRTH:  [birth year]   DATE OF CONSULTATION:  03/06/2005  DATE OF DISCHARGE:                                   CONSULTATION   CHIEF COMPLAINT:  Right facial swelling.   HISTORY:  A 40 year old white female developed some swelling of her right  cheek, approximately five to seven days ago.  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  She tends to pick and excoriate  facial sores.  She has been doing the same recently on the right side of her  face.  She developed a small knot last week which got larger and she was  seen in the [facility] four days ago.  They apparently did  some sort of studies of which the records are not available.  She was given,  to her recollection, doxycycline which did not seem to make much difference.  Yesterday morning, the swelling was substantially worse and she presented to  [facility] and was admitted for intravenous antibiosis.  Today she  has progressive swelling including some apparent involvement of the inside  of her mouth for which ENT was called in consultation.  The patient has no  past history of MRSA.  No past history of similar aggressive swelling of her  face.  She is not HIV positive, diabetic, or taking prednisone or any other  reason to think her immune status is not intact.  The cheek is quite  painful.   On admission, her white count was 11,000.  To her knowledge she has not been  febrile.   PHYSICAL EXAMINATION:  GENERAL:  This is an adult white female with  significant erythematous, tender swelling of the right cheek through-and-  through to the buccal mucosa.  Mental status seems basically intact.  She is  not warm to touch.  HEENT:  Ears are clear  with normal  drums.  Anterior nose is clear with some  excoriation consistent with her smoking.  Oral cavity reveals moist  membranes and teeth in apparent good repair.  There was some swelling of the  buccal mucosa but no lesions.  The oropharynx is clear.  Did not examine  nasopharynx or hypopharynx.  Neck is without adenopathy. She does have a  swollen area of her cheek, approximately 8 cm in diameter and 2-3 cm thick  which is erythematous and over which there are several superficial  excoriations, none of which appears moist or draining.  By digital  palpation, the lesion is thick and tender, but no obvious loculations and no  pointing abscess, either externally or internally.   IMPRESSION:  Right buccal cellulitis without abscess.   PLAN:  I would assume this is MRSA and she is being covered with vancomycin.  I would also put her on wound and skin precautions.  I did send a culture  from the right nostril.  I would ask her to use Bactroban ointment in both  nostrils and on all the facial sores  I would also encourage her to trim her  nails short so she is not so tempted to pick  I would recheck a CBC tomorrow to make sure the white count does not  continue to elevate.  If she continues to have progressive swelling or fails  to respond on antibiotics, I would check a CT scan with contrast to look for  purulent loculations within the substance of his cellulitis.  She  understands and agrees.      KTW/MEDQ  D:  03/06/2005  T:  03/06/2005  Job:  [id number]   cc:   Osvaldo Shipper, MD
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
## 2011-04-11 NOTE — Discharge Summary (Signed)
NAMEJARETZY, Lynn Wallace              ACCOUNT NO.:  [id number]   MEDICAL RECORD NO.:  [id number]          PATIENT TYPE:  INP   LOCATION:  A322                          FACILITY:  [facility]   PHYSICIAN:  Calvert Cantor, M.D.     DATE OF BIRTH:  [birth year]   DATE OF ADMISSION:  03/05/2005  DATE OF DISCHARGE:  04/16/2006LH                                 DISCHARGE SUMMARY   The patient has been assigned to Dr. Renard Matter, as she did not have a primary  care physician when she was admitted.   DISCHARGE DIAGNOSES:  1.  Right-sided facial cellulitis, resulting in an abscess formation.  2.  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  3.  Anxiety.   HOSPITAL COURSE:  This is a 40 year old white female who was admitted for  right-sided facial cellulitis.  The patient was started on vancomycin on  admission.  Blood cultures were done, and a nasal swab was done for MRSA.  Blood cultures have been negative.  The nasal swab has returned positive for  MRSA.  However, this is sensitive to doxycycline,  Bactrim, Levaquin along  with vancomycin.   An ENT consult was placed, and the patient was seen by Dr. Lazarus Salines on the  13th, who agreed with continuing the patient on vancomycin.  In addition, a  nasal swab was done for MRSA, and she was placed on wound and skin  precautions.  Bactroban ointment was added to be applied to her nostrils and  her facial sores.   The patient's cellulitis has improved on vancomycin.  However, the nasal  swab shows that the MRSA is sensitive to other antibiotics in addition to  vancomycin.  Therefore she has been placed on oral antibiotics.  She states  that she is eager to go home, and she is going to be discharged today with  follow-up appointment to be made with Dr. Renard Matter.  I have told her to  return to the ER if her facial swelling worsens.  She will follow up with  Dr. Renard Matter as an outpatient within 2-3 days.   The patient's facial cellulitis  actually collected to form an abscess, which  has been draining for the past 2 days now.  The drainage is currently clear.  However, the patient states that it was bloody at times.  A swab was sent  for culture, which shows no WBCs and no bacteria.  The remaining data is  pending.  This will be followed up, and if positive, reports will be  forwarded to Dr. Renard Matter.   DISCHARGE MEDICATIONS:  1.  Bactrim DS 1 tab b.i.d.  2.  Vicodin 1 tab q.4h. p.r.n. pain.  3.  Bactroban ointment to apply to her nostrils and her facial sores.      SR/MEDQ  D:  03/09/2005  T:  03/09/2005  Job:  [id number]

## 2011-04-11 NOTE — H&P (Signed)
NAME:  AINSLEY, DEAKINS              ACCOUNT NO.:  [id number]   MEDICAL RECORD NO.:  [id number]          PATIENT TYPE:  PINP   LOCATION:  A322                          FACILITY:  [facility]   PHYSICIAN:  Osvaldo Shipper, MD          DATE OF BIRTH:   DATE OF ADMISSION:  03/05/2005  DATE OF DISCHARGE:  LH                                HISTORY & PHYSICAL   The patient does not have a primary medical doctor.   ADMITTING DIAGNOSIS:  Facial cellulitis.   CHIEF COMPLAINT:  Right-sided face pain since last Friday.   HISTORY OF PRESENT ILLNESS:  Patient is a 40 year old Caucasian female who  has a past history of anxiety disorder who presents with a 5-day history of  right-sided facial pain.  The patient was apparently well until Friday of  last week when she noticed a small boil on the right side of her face.  She  went ahead and punctured the boil and there was some whitish discharge.  Following which she noticed pain and tingling sensation in that region.  In  the next couple of days the swelling increased.  She noticed redness and  warmth over this area.  Subsequently the patient went to [facility] on April 10; and, at that time, was prescribed doxycycline.  The  patient took this medication at home; however, the swelling seemed to  worsen.   At this time the patient is complaining of pain, swelling, and warmth over  the right side of her face which has progressively worsened in the past 2-3  days.  The patient has not noticed any fever or chills at home. She denies  any headache or any neck stiffness.  She denies any vision problems. She  denies any pain with swallowing, or any difficulty swallowing. She has  mentioned that she has noticed some sores on the inside of her mouth on the  right side.  She has difficulty opening her mouth fully.   The patient thinks that she may have had an insect bite sometime last week  to the same region that she is complaining of today.   MEDICATIONS:  1.  She is on Xanax 1 mg q.h.s.  2.  She has been taking ibuprofen for pain control with no relief.  3.  She has been on doxycycline since April 10.   ALLERGIES:  The patient is allergic to PENICILLIN which causes hives.   PAST MEDICAL HISTORY:  1.  Anxiety disorder.  2.  She had an ectopic pregnancy in 2004 followed by tubal ligation.  3.  She had an episode of uterine hemorrhage following her last childbirth      which was in 2001.   SOCIAL HISTORY:  She smokes about 1-1/2 pack of cigarettes per day and has  about a 30-pack-year history of smoking.  No alcohol intake. She smokes  marijuana occasionally. No cocaine use. She lives with her mother and her  step-father.   FAMILY HISTORY:  No medical problems in her parents.  No medical problems in  her siblings. No other  positive history was illicited.   REVIEW OF SYSTEMS:  A 10-point review of systems was done which did not  illicit any positive findings except as mentioned under the HPI.   PHYSICAL EXAMINATION:  VITAL SIGNS:  Temperature is 99.5, blood pressure  100/63, heart rate 80, respiratory rate about 14.  GENERAL:  This is a well-developed, well-nourished, white female in slight  discomfort.  HEENT:  Head is atraumatic.  Normocephalic.  There is no pallor or icterus  present.  There is swelling appreciated over the right side of the face  extending all the way from the lower margin of her eye, going all the way to  the angle of her mouth, and extending below the jaw line over the neck area.  There is warmth over this area.  There is some erythema noticed too.  However, there is no fluctuation that is appreciated.  No clear cut  discharge is illicited.  Inside the oral cavity I could not appreciate any  oral lesions at this time.  The oral cavity on the right side is tender to  palpation with the tongue depressor.  The right ear tympanic membrane showed  a good light reflux.  The left side was occluded by  cerumen and the tympanic  membrane could not be visualized.  The patient has restricted movement of  her jaw secondary to the swelling and the pain.  A large submandibular lymph  node is appreciated on the right side.  No cervical lymph nodes are  enlarged.  NECK:  Soft and supple.  RESPIRATORY:  Lungs are clear to auscultation bilaterally with no wheezing  or crackles.  CARDIOVASCULAR:  S1-S2 is normal, regular, no murmurs appreciated.  No  bruits are heard.  No S3-S4  no JVD is seen.  ABDOMEN:  Soft, nontender, nondistended.  Bowel sounds are present.  No mass  or organomegaly is appreciated.  EXTREMITIES:  Without any edema.  Peripheral pulses are palpable.  No joint  swelling is noticed.  SKIN EXAM:  There is no other rash or any other lesion appreciated on the  skin.   LABORATORY DATA:  CBC showed white count 11.2 with 79% neutrophils.  Hemoglobin 13.9, MCV 95, platelet count 285.   Sodium 134, the rest of her electrolytes were all within normal limits.  LFTs not available.   CAT scan of the maxillofacial region was done with contrast which shows soft  tissue swelling on the right face, over the jaw; however, no abscess  formation was seen.   IMPRESSION:  This is a 40 year old white female with a history of anxiety  disorder and no other medical problems.  Presents with a 5-to-6-day history  of right facial pain and appears to have facial cellulitis.  A history of a  possible insect bite could just be coincidental or could be the  precipitating factor for this facial pain.  The patient was prescribed  doxycycline at [facility]; however, she has not shown any improvement with  that antibiotic.   PLAN:  1.  Facial cellulitis.  We will start the patient on vancomycin to cover      MRSA and other staph infection.  Blood cultures have been drawn at the      ED and depending on culture results antibiotic regimen may be changed.     Will apply moist heat compresses to the right  side of the face to help      alleviate the symptoms.  Will also give the  patient analgesics with      opioids to control the pain.  At this time no surgical consult is deemed      necessary since      there is no clear cut abscess formation.  If the patient fails to      respond to this treatment we will obtain consultation from an ENT      specialist.  2.  Further management decisions will be based on the patient's response to      treatment and results of initial testing.      GK/MEDQ  D:  03/05/2005  T:  03/05/2005  Job:  [id number]
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
## 2011-04-15 ENCOUNTER — Encounter: Payer: Self-pay | Admitting: Gastroenterology

## 2011-04-17 ENCOUNTER — Ambulatory Visit: Payer: Self-pay

## 2011-04-18 ENCOUNTER — Ambulatory Visit (INDEPENDENT_AMBULATORY_CARE_PROVIDER_SITE_OTHER): Payer: Self-pay | Admitting: Family Medicine

## 2011-04-18 ENCOUNTER — Encounter: Payer: Self-pay | Admitting: Family Medicine

## 2011-04-18 VITALS — BP 108/80 | HR 76 | Temp 97.7°F | Ht 63.0 in | Wt 203.0 lb

## 2011-04-18 DIAGNOSIS — J40 Bronchitis, not specified as acute or chronic: Secondary | ICD-10-CM

## 2011-04-18 MED ORDER — HYDROCODONE-HOMATROPINE 5-1.5 MG/5ML PO SYRP: 5.0000 mL | ORAL_SOLUTION | ORAL | Status: AC | PRN

## 2011-04-18 MED ORDER — SULFAMETHOXAZOLE-TRIMETHOPRIM 800-160 MG PO TABS: 1.0000 | ORAL_TABLET | Freq: Two times a day (BID) | ORAL | Status: AC

## 2011-04-18 NOTE — Progress Notes (Signed)
  Subjective:    Patient ID: Lynn Wallace, female    DOB: [birth year], 40 y.o.   MRN: [id number]  HPI Here for one week of PND, chest congestion, and coughing up green sputum. No fever   Review of Systems  Constitutional: Negative.   HENT: Positive for congestion, sore throat, postnasal drip and sinus pressure.   Eyes: Negative.   Respiratory: Positive for cough.        Objective:   Physical Exam  Constitutional: She appears well-developed and well-nourished.  HENT:  Right Ear: External ear normal.  Left Ear: External ear normal.  Nose: Nose normal.  Mouth/Throat: Oropharynx is clear and moist. No oropharyngeal exudate.  Eyes: Conjunctivae are normal. Pupils are equal, round, and reactive to light.  Neck: No thyromegaly present.  Pulmonary/Chest: Effort normal and breath sounds normal.  Lymphadenopathy:    She has no cervical adenopathy.          Assessment & Plan:  Rest, fluids

## 2011-04-19 ENCOUNTER — Emergency Department (HOSPITAL_COMMUNITY)
Admission: EM | Admit: 2011-04-19 | Discharge: 2011-04-19 | Discharge status: Against Medical Advice | Payer: Self-pay | Attending: Emergency Medicine | Admitting: Emergency Medicine

## 2011-04-19 DIAGNOSIS — M7989 Other specified soft tissue disorders: Secondary | ICD-10-CM | POA: Insufficient documentation

## 2011-04-19 DIAGNOSIS — R079 Chest pain, unspecified: Secondary | ICD-10-CM | POA: Insufficient documentation

## 2011-04-19 DIAGNOSIS — R51 Headache: Secondary | ICD-10-CM | POA: Insufficient documentation

## 2011-04-19 LAB — POCT I-STAT, CHEM 8
Calcium, Ion: 1.16 mmol/L (ref 1.12–1.32)
Chloride: 101 mEq/L (ref 96–112)
Glucose, Bld: 101 mg/dL — ABNORMAL HIGH (ref 70–99)
HCT: 44 % (ref 36.0–46.0)
Hemoglobin: 15 g/dL (ref 12.0–15.0)
Potassium: 3.9 mEq/L (ref 3.5–5.1)

## 2011-04-19 LAB — DIFFERENTIAL
Basophils Absolute: 0 10*3/uL (ref 0.0–0.1)
Basophils Relative: 0 % (ref 0–1)
Eosinophils Absolute: 0 10*3/uL (ref 0.0–0.7)
Eosinophils Relative: 0 % (ref 0–5)
Neutrophils Relative %: 73 % (ref 43–77)

## 2011-04-19 LAB — CBC
Platelets: 240 10*3/uL (ref 150–400)
RBC: 4.23 MIL/uL (ref 3.87–5.11)
RDW: 13.1 % (ref 11.5–15.5)
WBC: 11.5 10*3/uL — ABNORMAL HIGH (ref 4.0–10.5)

## 2011-04-19 LAB — CK: Total CK: 40 U/L (ref 7–177)

## 2011-04-19 LAB — TROPONIN I: Troponin I: <0.3 ng/mL (ref <0.30)

## 2011-04-22 ENCOUNTER — Ambulatory Visit (AMBULATORY_SURGERY_CENTER): Payer: Self-pay | Admitting: Gastroenterology

## 2011-04-22 ENCOUNTER — Encounter: Payer: Self-pay | Admitting: Gastroenterology

## 2011-04-22 DIAGNOSIS — K2940 Chronic atrophic gastritis without bleeding: Secondary | ICD-10-CM

## 2011-04-22 DIAGNOSIS — R198 Other specified symptoms and signs involving the digestive system and abdomen: Secondary | ICD-10-CM

## 2011-04-22 DIAGNOSIS — R1310 Dysphagia, unspecified: Secondary | ICD-10-CM

## 2011-04-22 DIAGNOSIS — K219 Gastro-esophageal reflux disease without esophagitis: Secondary | ICD-10-CM

## 2011-04-22 DIAGNOSIS — A048 Other specified bacterial intestinal infections: Secondary | ICD-10-CM

## 2011-04-22 DIAGNOSIS — K2970 Gastritis, unspecified, without bleeding: Secondary | ICD-10-CM

## 2011-04-22 DIAGNOSIS — D126 Benign neoplasm of colon, unspecified: Secondary | ICD-10-CM

## 2011-04-22 DIAGNOSIS — K5900 Constipation, unspecified: Secondary | ICD-10-CM

## 2011-04-22 DIAGNOSIS — R1084 Generalized abdominal pain: Secondary | ICD-10-CM

## 2011-04-22 MED ORDER — SODIUM CHLORIDE 0.9 % IV SOLN: 500.0000 mL | INTRAVENOUS | Status: DC

## 2011-04-22 MED ORDER — OMEPRAZOLE 40 MG PO CPDR: 40.0000 mg | DELAYED_RELEASE_CAPSULE | ORAL | Status: DC

## 2011-04-22 NOTE — Patient Instructions (Signed)
Follow dilatation diet today, resume your normal diet in am.  High Fiber diet after today with liberal fluid intake.  Next colonoscopy dependent on pathology results.

## 2011-04-23 ENCOUNTER — Telehealth: Payer: Self-pay

## 2011-04-23 NOTE — Telephone Encounter (Signed)
I called hm # and cell #.  No ID on answering machine.  Cell phone # played Whitney Huston's I will always love you.  MAW

## 2011-04-28 ENCOUNTER — Encounter: Payer: Self-pay | Admitting: Family Medicine

## 2011-04-28 ENCOUNTER — Encounter: Payer: Self-pay | Admitting: Gastroenterology

## 2011-04-28 ENCOUNTER — Ambulatory Visit (INDEPENDENT_AMBULATORY_CARE_PROVIDER_SITE_OTHER): Payer: Self-pay | Admitting: Family Medicine

## 2011-04-28 VITALS — BP 112/80 | Temp 98.2°F

## 2011-04-28 DIAGNOSIS — R609 Edema, unspecified: Secondary | ICD-10-CM

## 2011-04-28 DIAGNOSIS — R51 Headache: Secondary | ICD-10-CM

## 2011-04-28 DIAGNOSIS — M545 Low back pain: Secondary | ICD-10-CM

## 2011-04-28 DIAGNOSIS — R600 Localized edema: Secondary | ICD-10-CM

## 2011-04-28 MED ORDER — KETOROLAC TROMETHAMINE 60 MG/2ML IM SOLN
60.0000 mg | Freq: Once | INTRAMUSCULAR | Status: AC
  Administered 2011-04-28: 60 mg via INTRAMUSCULAR

## 2011-04-28 MED ORDER — FUROSEMIDE 40 MG PO TABS: 40.0000 mg | ORAL_TABLET | Freq: Two times a day (BID) | ORAL | Status: DC

## 2011-04-28 NOTE — Progress Notes (Signed)
  Subjective:    Patient ID: Lynn Wallace, female    DOB: 1971-11-14, 39 y.o.   MRN: [id number]  HPI Here for lower extremity swelling that has been going on for the past 2 months. This has becopme more of a problem in the last 2 weeks. There is some discomfort in the feet and legs. Mild hand swelling. No weight change or SOB. No cough. She also complains of a migraine HA that has been present for 2 days. She was seen in the ER several days ago for these same symptoms, but no etiology was found. Her labs were normal, including her renal function.    Review of Systems  Constitutional: Negative.   Respiratory: Negative.   Cardiovascular: Positive for leg swelling. Negative for chest pain and palpitations.  Gastrointestinal: Negative.   Neurological: Positive for headaches.       Objective:   Physical Exam  Constitutional: She appears well-developed and well-nourished.  Cardiovascular: Normal rate, regular rhythm, normal heart sounds and intact distal pulses.  Exam reveals no gallop and no friction rub.   No murmur heard. Pulmonary/Chest: Effort normal and breath sounds normal. No respiratory distress. She has no wheezes. She has no rales. She exhibits no tenderness.  Musculoskeletal:       2+ edema to both lower legs and feet           Assessment & Plan:  Try Lasix bid. Restrict the sodium intake

## 2011-04-29 ENCOUNTER — Telehealth: Payer: Self-pay

## 2011-04-29 NOTE — Telephone Encounter (Signed)
Patient advised.  She will pick up rx at the pharmacy

## 2011-04-29 NOTE — Telephone Encounter (Signed)
Left message for patient to call back to discuss results of path

## 2011-04-29 NOTE — Telephone Encounter (Signed)
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 Pylera 3 po qid for 10 days along with his PPI bid.

## 2011-05-06 ENCOUNTER — Encounter: Payer: Self-pay | Admitting: Internal Medicine

## 2011-05-06 ENCOUNTER — Ambulatory Visit (INDEPENDENT_AMBULATORY_CARE_PROVIDER_SITE_OTHER): Payer: Self-pay | Admitting: Internal Medicine

## 2011-05-06 VITALS — BP 114/70 | HR 87 | Wt 200.0 lb

## 2011-05-06 DIAGNOSIS — F17200 Nicotine dependence, unspecified, uncomplicated: Secondary | ICD-10-CM

## 2011-05-06 DIAGNOSIS — H539 Unspecified visual disturbance: Secondary | ICD-10-CM

## 2011-05-06 DIAGNOSIS — M545 Low back pain: Secondary | ICD-10-CM

## 2011-05-06 MED ORDER — OXYCODONE-ACETAMINOPHEN 10-325 MG PO TABS: 1.0000 | ORAL_TABLET | Freq: Four times a day (QID) | ORAL | Status: DC | PRN

## 2011-05-06 NOTE — Assessment & Plan Note (Signed)
Counseled regarding need for cessation. States understanding. Recommend attempting over-the-counter nicotine patches. Instructed to not smoke while using patches.

## 2011-05-06 NOTE — Assessment & Plan Note (Signed)
Refill Percocet prn sparing use
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
## 2011-06-04 ENCOUNTER — Other Ambulatory Visit: Payer: Self-pay | Admitting: Family Medicine

## 2011-06-04 NOTE — Telephone Encounter (Signed)
I spoke with pt and she will call back on 06/09/11 to find out if the script has been approved by Dr. Clent Ridges for #120.

## 2011-06-04 NOTE — Telephone Encounter (Signed)
Pt last here on 05/06/11 and last filled on same date.

## 2011-06-04 NOTE — Telephone Encounter (Signed)
Pt called req to get script for Percocet 10-325 mg, to be picked up on Friday and not to be filled until Monday. Pls call when ready for pick up.

## 2011-06-04 NOTE — Telephone Encounter (Signed)
Dr Clent Ridges out of office.  If needed can rx for 40 # of percocet to  Cover and further refills per Dr Clent Ridges.

## 2011-06-09 ENCOUNTER — Telehealth: Payer: Self-pay | Admitting: Family Medicine

## 2011-06-09 MED ORDER — OXYCODONE-ACETAMINOPHEN 10-325 MG PO TABS: 1.0000 | ORAL_TABLET | Freq: Four times a day (QID) | ORAL | Status: DC | PRN

## 2011-06-09 NOTE — Telephone Encounter (Signed)
Pt came in to check status of getting percocet script. Dr Clent Ridges wrote script for Percocet 10-325 mg and script was given to pt. Pt signed for script.

## 2011-06-09 NOTE — Telephone Encounter (Signed)
done

## 2011-06-18 ENCOUNTER — Ambulatory Visit: Payer: Self-pay | Admitting: Family Medicine

## 2011-06-19 ENCOUNTER — Ambulatory Visit (INDEPENDENT_AMBULATORY_CARE_PROVIDER_SITE_OTHER): Payer: Self-pay | Admitting: Family Medicine

## 2011-06-19 ENCOUNTER — Encounter: Payer: Self-pay | Admitting: Family Medicine

## 2011-06-19 VITALS — BP 106/70 | HR 109 | Temp 98.2°F | Wt 196.0 lb

## 2011-06-19 DIAGNOSIS — M545 Low back pain: Secondary | ICD-10-CM

## 2011-06-19 DIAGNOSIS — F411 Generalized anxiety disorder: Secondary | ICD-10-CM

## 2011-06-19 DIAGNOSIS — F419 Anxiety disorder, unspecified: Secondary | ICD-10-CM

## 2011-06-19 DIAGNOSIS — G8929 Other chronic pain: Secondary | ICD-10-CM

## 2011-06-19 MED ORDER — OXYCODONE-ACETAMINOPHEN 10-325 MG PO TABS: 1.0000 | ORAL_TABLET | Freq: Four times a day (QID) | ORAL | Status: DC | PRN

## 2011-06-19 MED ORDER — ALPRAZOLAM 2 MG PO TABS: 2.0000 mg | ORAL_TABLET | Freq: Four times a day (QID) | ORAL | Status: DC

## 2011-06-19 NOTE — Progress Notes (Signed)
  Subjective:    Patient ID: Lynn Wallace, female    DOB: 1971-10-02, 40 y.o.   MRN: [id number]  HPI Here to follow up on anxiety and low back pain. She has been under a lot of stress recently worrying about her mother. Her mother was diagnosed with metastatic cervical cancer a few weeks ago, and it sounds like the prognosis is grim. She worries all the time, can't sleep, and is tearful a lot.    Review of Systems  Constitutional: Negative.   Musculoskeletal: Positive for back pain.  Psychiatric/Behavioral: Positive for dysphoric mood and decreased concentration. The patient is nervous/anxious.        Objective:   Physical Exam  Constitutional: She appears well-developed and well-nourished.  Psychiatric:       Affect is depressed, and she is tearful           Assessment & Plan:  Increase the dose of her Xanax. Refill other meds.

## 2011-06-27 ENCOUNTER — Telehealth: Payer: Self-pay | Admitting: Family Medicine

## 2011-06-27 NOTE — Telephone Encounter (Signed)
Noted. We will find out from the pharmacy if she is trying to fill these too quickly
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
## 2011-07-08 ENCOUNTER — Telehealth: Payer: Self-pay | Admitting: Family Medicine

## 2011-07-08 NOTE — Telephone Encounter (Signed)
Pt needs letter stating reasons she is being seen by Dr. Clent Ridges and the medications she is on she needs this because she is applying for [employer]. Please contact pt with any questions

## 2011-07-09 NOTE — Telephone Encounter (Signed)
She should be able to talk to our Medical Records staff to print out a problem list and a medication list. No letter is necessary.

## 2011-07-10 ENCOUNTER — Telehealth: Payer: Self-pay | Admitting: Family Medicine

## 2011-07-16 ENCOUNTER — Telehealth: Payer: Self-pay | Admitting: Family Medicine

## 2011-07-16 NOTE — Telephone Encounter (Signed)
Pt requesting refill on oxyCODONE-acetaminophen (PERCOCET) 10-325 MG per tablet Pt requesting to pick up the refill Friday but have it dated for Monday because she can not have it refill till Monday. Please contact pt when ready.

## 2011-07-17 NOTE — Telephone Encounter (Signed)
Refill request for Percocet 10-325 mg take 1 po q6hr prn. Pt last here on 06/19/11 and script last filled on that same date.

## 2011-07-18 MED ORDER — OXYCODONE-ACETAMINOPHEN 10-325 MG PO TABS: 1.0000 | ORAL_TABLET | Freq: Four times a day (QID) | ORAL | Status: DC | PRN

## 2011-07-18 NOTE — Telephone Encounter (Signed)
Script is ready and pt aware.

## 2011-07-18 NOTE — Telephone Encounter (Signed)
done

## 2011-07-24 ENCOUNTER — Ambulatory Visit: Payer: Self-pay | Admitting: Family Medicine

## 2011-07-29 ENCOUNTER — Telehealth: Payer: Self-pay | Admitting: Family Medicine

## 2011-07-29 NOTE — Telephone Encounter (Signed)
Pt was recently switched to alprazolam Prudy Feeler) 2 MG tablet pt said she doesn't like it it makes her to drowsy and would like to be put back on alprazolam (XANAX) 1 MG tablet. Please contact pt

## 2011-07-29 NOTE — Telephone Encounter (Signed)
Pt would like to pick up a written script for the below medication. Call pt when ready.

## 2011-07-30 MED ORDER — ALPRAZOLAM 1 MG PO TABS
1.0000 mg | ORAL_TABLET | Freq: Four times a day (QID) | ORAL | Status: DC | PRN
Start: 2011-07-30 — End: 2011-12-10

## 2011-07-30 NOTE — Telephone Encounter (Signed)
Call in Xanax 1 mg 4 times daily, #120 with 5 rf

## 2011-07-30 NOTE — Telephone Encounter (Signed)
Instead of calling this in, we will write it out to give to her

## 2011-07-30 NOTE — Telephone Encounter (Signed)
Please call in her Xanax to [facility]---ph---[phone]. Thanks.

## 2011-07-30 NOTE — Telephone Encounter (Signed)
Pt called back and said that sh has checked with [facility] pharmacy and her alprazolam 1 mg has not been called in yet. Pls call it in asap today. [phone]. Pt is waiting at pharmacy.

## 2011-08-08 ENCOUNTER — Telehealth: Payer: Self-pay | Admitting: *Deleted

## 2011-08-08 ENCOUNTER — Ambulatory Visit (INDEPENDENT_AMBULATORY_CARE_PROVIDER_SITE_OTHER): Payer: Self-pay | Admitting: Family Medicine

## 2011-08-08 ENCOUNTER — Encounter: Payer: Self-pay | Admitting: Family Medicine

## 2011-08-08 VITALS — BP 102/64 | HR 84 | Temp 98.0°F | Wt 200.0 lb

## 2011-08-08 DIAGNOSIS — G8929 Other chronic pain: Secondary | ICD-10-CM

## 2011-08-08 DIAGNOSIS — M545 Low back pain, unspecified: Secondary | ICD-10-CM

## 2011-08-08 MED ORDER — OXYCODONE-ACETAMINOPHEN 10-325 MG PO TABS: 1.0000 | ORAL_TABLET | ORAL | Status: DC | PRN

## 2011-08-08 MED ORDER — KETOROLAC TROMETHAMINE 60 MG/2ML IM SOLN
60.0000 mg | Freq: Once | INTRAMUSCULAR | Status: AC
  Administered 2011-08-08: 60 mg via INTRAMUSCULAR

## 2011-08-08 MED ORDER — GABAPENTIN 300 MG PO CAPS: 300.0000 mg | ORAL_CAPSULE | Freq: Three times a day (TID) | ORAL | Status: DC

## 2011-08-08 NOTE — Progress Notes (Signed)
  Subjective:    Patient ID: Lynn Wallace, female    DOB: [birth year], 40 y.o.   MRN: [id number]  HPI Here for chronic low back pain that radiates down the right leg. She has been using Percocet 4 times a day, but this is not helping as much as before. She has applied for disability but is waiting on this.    Review of Systems  Constitutional: Negative.   Musculoskeletal: Positive for back pain.       Objective:   Physical Exam  Constitutional: She appears well-developed and well-nourished.       In pain  Musculoskeletal:       Tender in the low back with reduced ROM           Assessment & Plan:  We will increase the Percocet to 6 times a day and add Neurontin. Refer to Dr. Sharolyn Douglas for the back issues

## 2011-08-08 NOTE — Progress Notes (Signed)
Addended by: Aniceto Boss A on: 08/08/2011 04:18 PM   Modules accepted: Orders

## 2011-08-08 NOTE — Telephone Encounter (Signed)
She was seen in the clinic

## 2011-08-08 NOTE — Telephone Encounter (Signed)
Pt states she is in tremendous pain and the #10 percocet is not helping at all.  She has taken 2 today already.  Pt wants to know if she can get a stronger med or she needs to come in for an injection.

## 2011-08-14 LAB — URINALYSIS, ROUTINE W REFLEX MICROSCOPIC
Glucose, UA: NEGATIVE
Nitrite: NEGATIVE
Specific Gravity, Urine: 1.029
pH: 5

## 2011-08-14 LAB — COMPREHENSIVE METABOLIC PANEL
ALT: 10
Albumin: 3.8
Alkaline Phosphatase: 61
Chloride: 109
Glucose, Bld: 115 — ABNORMAL HIGH
Potassium: 3.6
Sodium: 140
Total Bilirubin: 0.5
Total Protein: 6.9

## 2011-08-14 LAB — SALICYLATE LEVEL: Salicylate Lvl: <4

## 2011-08-14 LAB — DIFFERENTIAL
Basophils Absolute: 0
Basophils Relative: 0
Monocytes Relative: 5
Neutro Abs: 7.7
Neutrophils Relative %: 80 — ABNORMAL HIGH

## 2011-08-14 LAB — POCT CARDIAC MARKERS: Troponin i, poc: <0.05

## 2011-08-14 LAB — RAPID URINE DRUG SCREEN, HOSP PERFORMED
Amphetamines: NOT DETECTED
Barbiturates: NOT DETECTED
Barbiturates: NOT DETECTED
Benzodiazepines: POSITIVE — AB
Cocaine: NOT DETECTED
Opiates: NOT DETECTED
Opiates: NOT DETECTED

## 2011-08-14 LAB — CBC
Hemoglobin: 13.4
Platelets: 229
RDW: 13
WBC: 9.6

## 2011-08-14 LAB — D-DIMER, QUANTITATIVE: D-Dimer, Quant: 0.33

## 2011-08-14 LAB — ACETAMINOPHEN LEVEL: Acetaminophen (Tylenol), Serum: <10 — ABNORMAL LOW

## 2011-08-14 LAB — ETHANOL: Alcohol, Ethyl (B): <5

## 2011-08-18 ENCOUNTER — Telehealth: Payer: Self-pay | Admitting: Family Medicine

## 2011-08-18 NOTE — Telephone Encounter (Signed)
noted 
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
## 2011-08-19 ENCOUNTER — Telehealth: Payer: Self-pay | Admitting: *Deleted
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
## 2011-08-20 ENCOUNTER — Ambulatory Visit: Payer: Self-pay | Admitting: Family Medicine

## 2011-08-21 ENCOUNTER — Ambulatory Visit (INDEPENDENT_AMBULATORY_CARE_PROVIDER_SITE_OTHER): Payer: Self-pay | Admitting: Family Medicine

## 2011-08-21 ENCOUNTER — Encounter: Payer: Self-pay | Admitting: Family Medicine

## 2011-08-21 VITALS — BP 110/72 | HR 106 | Temp 98.4°F | Wt 198.0 lb

## 2011-08-21 DIAGNOSIS — J40 Bronchitis, not specified as acute or chronic: Secondary | ICD-10-CM

## 2011-08-21 MED ORDER — METHYLPREDNISOLONE ACETATE 80 MG/ML IJ SUSP
120.0000 mg | Freq: Once | INTRAMUSCULAR | Status: AC
  Administered 2011-08-21: 120 mg via INTRAMUSCULAR

## 2011-08-21 MED ORDER — AZITHROMYCIN 250 MG PO TABS: ORAL_TABLET | ORAL | Status: AC

## 2011-08-21 MED ORDER — HYDROCOD POLST-CHLORPHEN POLST 10-8 MG/5ML PO LQCR: 5.0000 mL | Freq: Two times a day (BID) | ORAL | Status: DC | PRN

## 2011-08-21 NOTE — Progress Notes (Signed)
  Subjective:    Patient ID: Lynn Wallace, female    DOB: 27-Dec-1970, 40 y.o.   MRN: [id number]  HPI Here for 5 days of fevers, ST, chest tightness, and coughing up green sputum.    Review of Systems  Constitutional: Positive for fever.  HENT: Positive for congestion, postnasal drip and sinus pressure.   Eyes: Negative.   Respiratory: Positive for cough.        Objective:   Physical Exam  Constitutional:       Appears ill   Neck: Neck supple. No thyromegaly present.  Pulmonary/Chest: Effort normal. She has no wheezes. She has no rales.       Scattered rhonchi  Lymphadenopathy:    She has no cervical adenopathy.          Assessment & Plan:  Rest,fluids

## 2011-08-22 ENCOUNTER — Telehealth: Payer: Self-pay | Admitting: Family Medicine

## 2011-08-22 MED ORDER — AMOXICILLIN 875 MG PO TABS: 875.0000 mg | ORAL_TABLET | Freq: Two times a day (BID) | ORAL | Status: AC

## 2011-08-22 NOTE — Telephone Encounter (Signed)
Script sent e-scribe and left voice message for pt. 

## 2011-08-22 NOTE — Telephone Encounter (Signed)
Pt called and said that the zpak is too expensive. Pt req that Dr Clent Ridges call in some Amoxicillin, which is $4 at [facility] on [address].

## 2011-08-22 NOTE — Telephone Encounter (Signed)
Call in Amoxicillin 875 mg bid for 10 days  

## 2011-08-25 ENCOUNTER — Telehealth: Payer: Self-pay | Admitting: Family Medicine

## 2011-08-25 NOTE — Telephone Encounter (Signed)
Pt requested to remove Penicillin from allergy list, it was not truly a reaction.

## 2011-08-25 NOTE — Telephone Encounter (Signed)
Patient stated that she cannot take the abx because it has pcn in it. She will also bring the rx for Tussinex back to expensive.   [facility].

## 2011-08-26 NOTE — Telephone Encounter (Signed)
noted 

## 2011-09-03 ENCOUNTER — Other Ambulatory Visit: Payer: Self-pay | Admitting: Family Medicine

## 2011-09-03 NOTE — Telephone Encounter (Signed)
Pt need new rx percocet 10-325mg .

## 2011-09-04 ENCOUNTER — Ambulatory Visit: Payer: Self-pay | Admitting: Family Medicine

## 2011-09-04 MED ORDER — OXYCODONE-ACETAMINOPHEN 10-325 MG PO TABS: 1.0000 | ORAL_TABLET | ORAL | Status: DC | PRN

## 2011-09-04 NOTE — Telephone Encounter (Signed)
Pt called to check on status of refill for Percocet. Pt req to pick up script today. Pls call when ready for pick up.

## 2011-09-04 NOTE — Telephone Encounter (Signed)
done

## 2011-09-04 NOTE — Telephone Encounter (Signed)
Script ready for pick up and left voice message for pt. 

## 2011-09-05 ENCOUNTER — Telehealth: Payer: Self-pay | Admitting: Family Medicine

## 2011-09-05 NOTE — Telephone Encounter (Signed)
Pt called back again. Req to get call back asap today.

## 2011-09-05 NOTE — Telephone Encounter (Signed)
Spoke with pt
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
## 2011-09-05 NOTE — Telephone Encounter (Signed)
NO early refills. It is her respsonsibility to keep up with her meds. She will have to wait until 10-05-11 to get more

## 2011-09-05 NOTE — Telephone Encounter (Signed)
Pt called back again to check on status of getting another script for Percocet. Req call back this a.m from nurse.

## 2011-09-08 ENCOUNTER — Ambulatory Visit (INDEPENDENT_AMBULATORY_CARE_PROVIDER_SITE_OTHER): Payer: Self-pay | Admitting: Family Medicine

## 2011-09-08 ENCOUNTER — Encounter: Payer: Self-pay | Admitting: Family Medicine

## 2011-09-08 VITALS — BP 110/74 | HR 89 | Temp 98.3°F | Wt 198.0 lb

## 2011-09-08 DIAGNOSIS — M545 Low back pain, unspecified: Secondary | ICD-10-CM

## 2011-09-08 DIAGNOSIS — G8929 Other chronic pain: Secondary | ICD-10-CM

## 2011-09-08 DIAGNOSIS — J40 Bronchitis, not specified as acute or chronic: Secondary | ICD-10-CM

## 2011-09-08 MED ORDER — CEPHALEXIN 500 MG PO CAPS: 500.0000 mg | ORAL_CAPSULE | Freq: Three times a day (TID) | ORAL | Status: AC

## 2011-09-08 MED ORDER — OXYCODONE-ACETAMINOPHEN 10-325 MG PO TABS: 1.0000 | ORAL_TABLET | ORAL | Status: DC | PRN
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
## 2011-09-15 ENCOUNTER — Other Ambulatory Visit: Payer: Self-pay | Admitting: Family Medicine

## 2011-09-15 NOTE — Telephone Encounter (Signed)
Pt called req refill of small bottle Tussinex  Cough syrup to [facility]. Pt is still coughing.

## 2011-09-16 ENCOUNTER — Telehealth: Payer: Self-pay | Admitting: Family Medicine

## 2011-09-16 MED ORDER — HYDROCOD POLST-CHLORPHEN POLST 10-8 MG/5ML PO LQCR: 5.0000 mL | Freq: Two times a day (BID) | ORAL | Status: DC | PRN

## 2011-09-16 NOTE — Telephone Encounter (Signed)
Call in another 240 ml bottle of Tussionex with no rf

## 2011-09-16 NOTE — Telephone Encounter (Signed)
Pt is aware med will be call into pharm

## 2011-09-16 NOTE — Telephone Encounter (Signed)
Pt requesting to pick up a script for her cough med and earnest Maisie Fus med. Please contact pt

## 2011-09-16 NOTE — Telephone Encounter (Signed)
Script called in

## 2011-09-26 ENCOUNTER — Telehealth: Payer: Self-pay | Admitting: Family Medicine

## 2011-09-26 MED ORDER — PROMETHAZINE HCL 12.5 MG PO TABS: 12.5000 mg | ORAL_TABLET | ORAL | Status: DC | PRN

## 2011-09-26 NOTE — Telephone Encounter (Signed)
Called pt and left a message that rx had been called in to pharmacy.

## 2011-09-26 NOTE — Telephone Encounter (Signed)
Pt called and wants to make sure that promethazine (PHENERGAN) 12.5 MG tablet   is called in to [facility] [place]. Pt says that she is extremely sick and needs this called in today, before end of day today.

## 2011-09-26 NOTE — Telephone Encounter (Signed)
Would like new rx for Phenigan for nausea [facility]. Thanks.

## 2011-09-26 NOTE — Telephone Encounter (Signed)
Please tell her this was sent in

## 2011-10-02 ENCOUNTER — Telehealth: Payer: Self-pay | Admitting: Family Medicine

## 2011-10-02 NOTE — Telephone Encounter (Signed)
Pt need guaifenesin with codeine call into [facility] outpt phar [phone]. Pt can not afford other cough med.

## 2011-10-02 NOTE — Telephone Encounter (Signed)
This Robitussin AC syrup. Take 5 ml q 4hours prn cough. Call in 240 ml with no rf

## 2011-10-03 NOTE — Telephone Encounter (Signed)
rx called in, pt aware 

## 2011-10-06 ENCOUNTER — Other Ambulatory Visit: Payer: Self-pay | Admitting: Family Medicine

## 2011-10-06 MED ORDER — OXYCODONE-ACETAMINOPHEN 10-325 MG PO TABS: 1.0000 | ORAL_TABLET | ORAL | Status: DC | PRN

## 2011-10-06 NOTE — Telephone Encounter (Signed)
done

## 2011-10-06 NOTE — Telephone Encounter (Signed)
Script is ready and pt aware. 

## 2011-10-06 NOTE — Telephone Encounter (Signed)
Pt need new rx percocet.  

## 2011-10-09 ENCOUNTER — Encounter: Payer: Self-pay | Admitting: Family Medicine

## 2011-10-09 ENCOUNTER — Ambulatory Visit (INDEPENDENT_AMBULATORY_CARE_PROVIDER_SITE_OTHER): Payer: Self-pay | Admitting: Family Medicine

## 2011-10-09 VITALS — BP 112/74 | HR 89 | Temp 98.0°F | Wt 201.0 lb

## 2011-10-09 DIAGNOSIS — N920 Excessive and frequent menstruation with regular cycle: Secondary | ICD-10-CM

## 2011-10-09 NOTE — Progress Notes (Signed)
  Subjective:    Patient ID: Lynn Wallace, female    DOB: 08-Sep-1971, 40 y.o.   MRN: [id number]  HPI Here for several months of irregular and heavy periods. Her cramps are a bit worse.    Review of Systems  Constitutional: Negative.   Genitourinary: Positive for menstrual problem.       Objective:   Physical Exam  Constitutional: She appears well-developed and well-nourished.  Abdominal: Soft. Bowel sounds are normal. She exhibits no distension and no mass. There is no tenderness. There is no rebound and no guarding.          Assessment & Plan:  We will refer to GYN to assess this

## 2011-10-21 NOTE — Telephone Encounter (Signed)
Chart opened in error

## 2011-11-03 ENCOUNTER — Other Ambulatory Visit: Payer: Self-pay | Admitting: Family Medicine

## 2011-11-03 NOTE — Telephone Encounter (Signed)
Pt need new rx percocet by Wednesday.

## 2011-11-04 MED ORDER — OXYCODONE-ACETAMINOPHEN 10-325 MG PO TABS: 1.0000 | ORAL_TABLET | ORAL | Status: DC | PRN

## 2011-11-04 NOTE — Telephone Encounter (Signed)
done

## 2011-11-04 NOTE — Telephone Encounter (Signed)
Script is ready for pick up and pt aware. 

## 2011-11-25 DIAGNOSIS — F419 Anxiety disorder, unspecified: Secondary | ICD-10-CM

## 2011-11-25 HISTORY — DX: Anxiety disorder, unspecified: F41.9

## 2011-12-03 ENCOUNTER — Encounter: Payer: Self-pay | Admitting: Family Medicine

## 2011-12-03 ENCOUNTER — Ambulatory Visit (INDEPENDENT_AMBULATORY_CARE_PROVIDER_SITE_OTHER): Payer: Self-pay | Admitting: Family Medicine

## 2011-12-03 VITALS — BP 118/76 | HR 82 | Temp 98.4°F | Wt 200.0 lb

## 2011-12-03 DIAGNOSIS — S0093XA Contusion of unspecified part of head, initial encounter: Secondary | ICD-10-CM

## 2011-12-03 DIAGNOSIS — S1093XA Contusion of unspecified part of neck, initial encounter: Secondary | ICD-10-CM

## 2011-12-03 DIAGNOSIS — R42 Dizziness and giddiness: Secondary | ICD-10-CM

## 2011-12-03 MED ORDER — OXYCODONE-ACETAMINOPHEN 10-325 MG PO TABS: 1.0000 | ORAL_TABLET | ORAL | Status: DC | PRN

## 2011-12-04 ENCOUNTER — Encounter: Payer: Self-pay | Admitting: Family Medicine

## 2011-12-04 ENCOUNTER — Telehealth: Payer: Self-pay | Admitting: Family Medicine
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
## 2011-12-04 NOTE — Telephone Encounter (Signed)
Refill  Xanax to [facility]. She stated that the pharmacy says she has no refills. Please advise. Thanks.

## 2011-12-05 NOTE — Telephone Encounter (Signed)
I called pharmacy and gave verbal order, pt still had 1 refill left.

## 2011-12-10 ENCOUNTER — Telehealth: Payer: Self-pay | Admitting: *Deleted

## 2011-12-10 MED ORDER — ALPRAZOLAM 2 MG PO TABS: 2.0000 mg | ORAL_TABLET | Freq: Four times a day (QID) | ORAL | Status: AC

## 2011-12-10 NOTE — Telephone Encounter (Signed)
Refill on xanax 2mg  rx given to pt.

## 2011-12-17 ENCOUNTER — Ambulatory Visit (INDEPENDENT_AMBULATORY_CARE_PROVIDER_SITE_OTHER): Payer: Self-pay | Admitting: Family Medicine

## 2011-12-17 ENCOUNTER — Encounter: Payer: Self-pay | Admitting: Family Medicine

## 2011-12-17 VITALS — BP 118/78 | HR 84 | Temp 98.2°F | Wt 205.0 lb

## 2011-12-17 DIAGNOSIS — J40 Bronchitis, not specified as acute or chronic: Secondary | ICD-10-CM

## 2011-12-17 MED ORDER — KETOROLAC TROMETHAMINE 60 MG/2ML IM SOLN
60.0000 mg | Freq: Once | INTRAMUSCULAR | Status: AC
  Administered 2011-12-17: 60 mg via INTRAMUSCULAR

## 2011-12-17 MED ORDER — METHYLPREDNISOLONE ACETATE 80 MG/ML IJ SUSP
120.0000 mg | Freq: Once | INTRAMUSCULAR | Status: AC
  Administered 2011-12-17: 120 mg via INTRAMUSCULAR

## 2011-12-17 MED ORDER — CEPHALEXIN 500 MG PO CAPS: 500.0000 mg | ORAL_CAPSULE | Freq: Three times a day (TID) | ORAL | Status: DC

## 2011-12-17 MED ORDER — HYDROCODONE-HOMATROPINE 5-1.5 MG/5ML PO SYRP: 5.0000 mL | ORAL_SOLUTION | ORAL | Status: DC | PRN

## 2011-12-17 NOTE — Progress Notes (Signed)
Addended by: Aniceto Boss A on: 12/17/2011 05:08 PM   Modules accepted: Orders

## 2011-12-17 NOTE — Progress Notes (Signed)
  Subjective:    Patient ID: Lynn Wallace, female    DOB: Aug 30, 1971, 41 y.o.   MRN: [id number]  HPI Here for one week of chest tightness and coughing up green sputum. No fever. This has caused her headaches to get worse.    Review of Systems  Constitutional: Negative.   Eyes: Negative.   Respiratory: Positive for cough, chest tightness and shortness of breath.   Neurological: Positive for headaches.       Objective:   Physical Exam  Constitutional: She appears well-developed and well-nourished.  HENT:  Right Ear: External ear normal.  Left Ear: External ear normal.  Nose: Nose normal.  Mouth/Throat: Oropharynx is clear and moist. No oropharyngeal exudate.  Eyes: Conjunctivae are normal.  Pulmonary/Chest: Effort normal and breath sounds normal.  Lymphadenopathy:    She has no cervical adenopathy.          Assessment & Plan:  Add Mucinex prn

## 2011-12-18 MED ORDER — KETOROLAC TROMETHAMINE 60 MG/2ML IM SOLN: 60.0000 mg | Freq: Once | INTRAMUSCULAR | Status: DC

## 2011-12-18 NOTE — Progress Notes (Signed)
Addended by: Aniceto Boss A on: 12/18/2011 10:56 AM   Modules accepted: Orders

## 2011-12-23 ENCOUNTER — Encounter: Payer: Self-pay | Admitting: Family

## 2011-12-23 ENCOUNTER — Ambulatory Visit (INDEPENDENT_AMBULATORY_CARE_PROVIDER_SITE_OTHER): Payer: Self-pay | Admitting: Family

## 2011-12-23 VITALS — BP 130/84 | HR 76 | Temp 99.0°F | Resp 16 | Ht 62.0 in | Wt 200.0 lb

## 2011-12-23 DIAGNOSIS — H6980 Other specified disorders of Eustachian tube, unspecified ear: Secondary | ICD-10-CM

## 2011-12-23 DIAGNOSIS — R42 Dizziness and giddiness: Secondary | ICD-10-CM

## 2011-12-23 DIAGNOSIS — S0990XA Unspecified injury of head, initial encounter: Secondary | ICD-10-CM

## 2011-12-23 MED ORDER — MECLIZINE HCL 25 MG PO TABS: 25.0000 mg | ORAL_TABLET | Freq: Three times a day (TID) | ORAL | Status: DC | PRN
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
## 2011-12-23 NOTE — Patient Instructions (Signed)
1. Anti-histamine decongestant for the eustachian tube dysfunction. 2. If dizziness persist, we will consider a neurology referral and/or possible CT scan of the head.   Traumatic Brain Injury Traumatic brain injury (TBI) occurs when an injury to the head causes the brain to move back and forth. The risk of brain injury varies with the severity of the trauma. The damage can be confined to one area of the brain (focal) or involve different areas of the brain (diffuse). The severity of a brain injury can range from:  A blow or jolt to the head that disrupts the normal function of the brain (concussion).   A deep state of unconsciousness (coma).   Death.  CAUSES  A brain injury can result from:  A closed head injury. This occurs when the head suddenly and violently hits an object but the object does not break through the skull. Examples include:   A direct blow (hitting your head on a hard surface).   An indirect blow (when your head moves rapidly and violently back and forth like in a car crash). This injury is called countrecoup (involving a blow and counter blow). Shaken baby syndrome is a severe type of this injury. It happens when a baby is shaken forcibly enough to cause extreme countrecoup injury.   Penetrating head injury. A penetrating head injury occurs when an object pierces the skull and enters the brain tissue. Examples include:   A skull fracture occurs when the skull cracks or breaks.   A depressed skull fracture occurs when pieces of the broken skull press into the tissue of the brain. This can cause bruising of the brain tissue called a contusion.  In both closed and penetrating head injuries, damage to blood vessels can cause heavy bleeding into or around the brain.  SYMPTOMS  The symptoms of a TBI depend on the type and severity of the injury. The most common symptoms include:   Confusion (disorientation) or other thinking problems.   An inability to remember events  around the time of the injury (amnesia).   Difficulty staying awake or passing out (loss of consciousness).   Headache.   Blurry vision.   Vomiting.   Seizures.   Swelling of the scalp. This occurs because of bleeding or swelling under the skin of the skull when the head is hit.  TREATMENT Treatment of traumatic brain injury can involve a range of different medical options:  If a brain injury is moderate to severe, a hospital stay will be necessary to monitor:   Neurological status.   Pressure or swelling of the brain (intracranial pressure).   For seizures.   Severe brain injury cases may need surgery to:   Control bleeding.   Relieve pressure on the brain.   Remove objects from the brain that result from a penetrating injury.   Repair the skull from an injury.   Long term treatment of a brain injury can involve rehabilitation work such as:   Physical therapy.   Occupational therapy.   Speech therapy.  PROGNOSIS The outcome of TBI depends on the cause of the injury, location, severity, and extent of neurological damage. Outcomes range from good recovery to death. Long term consequences of a TBI can include:  Difficulty concentrating or having a short attention span.   Change in personality.   Irritability.   Headaches.   Blurry vision.   Sleepiness.   Depression.   Unsteadiness that makes walking or standing hard to do.  For more information  and support, contact: The [employer] of Neurological Disorders and Stroke.  Document Released: 10/31/2002 Document Revised: 07/23/2011 Document Reviewed: 11/08/2009 [facility] Patient Information 2012 [place], [place].  Barotitis Media Barotitis media is soreness (inflammation) of the area behind the eardrum (middle ear). This occurs when the auditory tube (Eustachian tube) leading from the back of the throat to the eardrum is blocked. When it is blocked air cannot move in and out of the middle ear to equalize  pressure changes. These pressure changes come from changes in altitude when:  Flying.   Driving in the mountains.   Diving.  Problems are more likely to occur with pressure changes during times when you are congested as from:  Hay fever.   Upper respiratory infection.   A cold.  Damage or hearing loss (barotrauma) caused by this may be permanent. HOME CARE INSTRUCTIONS   Use medicines as recommended by your caregiver. Over the counter medicines will help unblock the canal and can help during times of air travel.   Do not put anything into your ears to clean or unplug them. Eardrops will not be helpful.   Do not swim, dive, or fly until your caregiver says it is all right to do so. If these activities are necessary, chewing gum with frequent swallowing may help. It is also helpful to hold your nose and gently blow to pop your ears for equalizing pressure changes. This forces air into the Eustachian tube.   For little ones with problems, give your baby a bottle of water or juice during periods when pressure changes would be anticipated such as during take offs and landings associated with air travel.   Only take over-the-counter or prescription medicines for pain, discomfort, or fever as directed by your caregiver.   A decongestant may be helpful in de-congesting the middle ear and make pressure equalization easier. This can be even more effective if the drops (spray) are delivered with the head lying over the edge of a bed with the head tilted toward the ear on the affected side.   If your caregiver has given you a follow-up appointment, it is very important to keep that appointment. Not keeping the appointment could result in a chronic or permanent injury, pain, hearing loss and disability. If there is any problem keeping the appointment, you must call back to this facility for assistance.  SEEK IMMEDIATE MEDICAL CARE IF:   You develop a severe headache, dizziness, severe ear pain, or  bloody or pus-like drainage from your ears.   An oral temperature above 102 F (38.9 C) develops.   Your problems do not improve or become worse.  MAKE SURE YOU:   Understand these instructions.   Will watch your condition.   Will get help right away if you are not doing well or get worse.  Document Released: 11/07/2000 Document Revised: 07/23/2011 Document Reviewed: 06/15/2008 [facility] Patient Information 2012 [place], [place].

## 2011-12-26 ENCOUNTER — Telehealth: Payer: Self-pay | Admitting: Family Medicine

## 2011-12-26 NOTE — Telephone Encounter (Signed)
Pt is req to get refill of oxyCODONE-acetaminophen (PERCOCET) 10-325 MG per tablet today. Pt is leaving to go out of town Sunday and wants to pick up script today.

## 2011-12-26 NOTE — Telephone Encounter (Signed)
NO, it is too early. This cannot be refilled until  01-03-12

## 2011-12-26 NOTE — Telephone Encounter (Signed)
We will write this on 01-02-12

## 2011-12-26 NOTE — Telephone Encounter (Signed)
Pt has been informed that Dr Clent Ridges declined early refill on Oxycodone. Pt said that she would like to go ahead and req pick up on new script for Friday 01/02/12.

## 2011-12-29 ENCOUNTER — Telehealth: Payer: Self-pay | Admitting: Family Medicine

## 2011-12-30 ENCOUNTER — Telehealth: Payer: Self-pay | Admitting: Family Medicine

## 2011-12-30 NOTE — Telephone Encounter (Signed)
Pt will come in and sign medical release form for records

## 2011-12-30 NOTE — Telephone Encounter (Signed)
Refill Percocet. It is not due until Friday.But she wants to pick it up today, since she has to come by to fill out a release form for records. Please advise. Thanks.

## 2011-12-30 NOTE — Telephone Encounter (Signed)
No rx until this Friday

## 2012-01-02 ENCOUNTER — Telehealth: Payer: Self-pay | Admitting: Family Medicine

## 2012-01-02 MED ORDER — OXYCODONE-ACETAMINOPHEN 10-325 MG PO TABS: 1.0000 | ORAL_TABLET | ORAL | Status: DC | PRN

## 2012-01-02 NOTE — Telephone Encounter (Signed)
done

## 2012-01-02 NOTE — Telephone Encounter (Signed)
Pt called to check on status of getting her oxyCODONE-acetaminophen (PERCOCET) 10-325 MG per tablet today.  Pt was told that she can pick up script today. Pls call asap.

## 2012-01-02 NOTE — Telephone Encounter (Signed)
I sent script e-scribe and spoke with pt. 

## 2012-01-27 ENCOUNTER — Other Ambulatory Visit: Payer: Self-pay | Admitting: Family Medicine

## 2012-01-27 NOTE — Telephone Encounter (Signed)
Pt needs new rx percocet. Pt is aware she is due on 01-30-2012

## 2012-01-28 MED ORDER — OXYCODONE-ACETAMINOPHEN 10-325 MG PO TABS: 1.0000 | ORAL_TABLET | ORAL | Status: DC | PRN

## 2012-01-28 NOTE — Telephone Encounter (Signed)
Script is ready for pick up and spoke with pt. 

## 2012-01-28 NOTE — Telephone Encounter (Signed)
done

## 2012-01-30 ENCOUNTER — Ambulatory Visit (INDEPENDENT_AMBULATORY_CARE_PROVIDER_SITE_OTHER): Payer: Self-pay | Admitting: Family Medicine

## 2012-01-30 ENCOUNTER — Encounter: Payer: Self-pay | Admitting: Family Medicine

## 2012-01-30 VITALS — BP 120/70 | HR 88 | Temp 98.1°F | Wt 200.0 lb

## 2012-01-30 DIAGNOSIS — M545 Low back pain: Secondary | ICD-10-CM

## 2012-01-30 MED ORDER — ALPRAZOLAM 1 MG PO TABS
1.0000 mg | ORAL_TABLET | Freq: Four times a day (QID) | ORAL | Status: DC
Start: 2012-01-30 — End: 2012-02-24

## 2012-01-30 NOTE — Progress Notes (Signed)
  Subjective:    Patient ID: Lynn Wallace, female    DOB: June 21, 1971, 41 y.o.   MRN: [id number]  HPI Here to follow up on low back pain. One year ago she had an MRI that showed no surgical lesions. She takes Percocet regularly but does not  exercise.    Review of Systems  Constitutional: Negative.   Respiratory: Negative.   Cardiovascular: Negative.   Musculoskeletal: Positive for back pain.       Objective:   Physical Exam  Constitutional: She appears well-developed and well-nourished.  Musculoskeletal:       Tender in the lower back with spasm and reduced ROM          Assessment & Plan:  Refer to PT

## 2012-02-09 ENCOUNTER — Ambulatory Visit (INDEPENDENT_AMBULATORY_CARE_PROVIDER_SITE_OTHER): Payer: Self-pay | Admitting: Family Medicine

## 2012-02-09 ENCOUNTER — Encounter: Payer: Self-pay | Admitting: Family Medicine

## 2012-02-09 VITALS — BP 118/66 | HR 82 | Temp 98.1°F | Wt 197.0 lb

## 2012-02-09 DIAGNOSIS — J40 Bronchitis, not specified as acute or chronic: Secondary | ICD-10-CM

## 2012-02-09 MED ORDER — CEPHALEXIN 500 MG PO CAPS: 500.0000 mg | ORAL_CAPSULE | Freq: Three times a day (TID) | ORAL | Status: AC

## 2012-02-09 MED ORDER — KETOROLAC TROMETHAMINE 60 MG/2ML IM SOLN
60.0000 mg | Freq: Once | INTRAMUSCULAR | Status: AC
  Administered 2012-02-09: 60 mg via INTRAMUSCULAR

## 2012-02-09 MED ORDER — METHYLPREDNISOLONE ACETATE 80 MG/ML IJ SUSP
120.0000 mg | Freq: Once | INTRAMUSCULAR | Status: AC
  Administered 2012-02-09: 120 mg via INTRAMUSCULAR

## 2012-02-09 NOTE — Progress Notes (Signed)
  Subjective:    Patient ID: Lynn Wallace, female    DOB: 10-27-1971, 41 y.o.   MRN: [id number]  HPI Here for 5 days of chest congestion and coughing up green sputum. No fever.    Review of Systems  Constitutional: Negative.   HENT: Negative.   Eyes: Negative.   Respiratory: Positive for cough and chest tightness.        Objective:   Physical Exam  Constitutional: She appears well-developed and well-nourished.  HENT:  Right Ear: External ear normal.  Left Ear: External ear normal.  Nose: Nose normal.  Mouth/Throat: Oropharynx is clear and moist. No oropharyngeal exudate.  Eyes: Conjunctivae are normal.  Pulmonary/Chest: Effort normal. She has no wheezes. She has no rales.       Scattered rhonchi   Lymphadenopathy:    She has no cervical adenopathy.          Assessment & Plan:  Add Mucinex

## 2012-02-23 ENCOUNTER — Other Ambulatory Visit: Payer: Self-pay | Admitting: Family Medicine

## 2012-02-23 NOTE — Telephone Encounter (Signed)
Can give #40 of the xanax and # 40 of percocet  Dr Clent Ridges out of office and he can do further refills.

## 2012-02-23 NOTE — Telephone Encounter (Signed)
Pt needs refills on alprazolam 1 mg call into [facility] [phone] due on 02-25-2012. Pt also needs new rx percocet due on 02-27-2012. Pt is aware Dr Clent Ridges out of office until 03-01-2012

## 2012-02-24 MED ORDER — OXYCODONE-ACETAMINOPHEN 10-325 MG PO TABS: 1.0000 | ORAL_TABLET | ORAL | Status: DC | PRN

## 2012-02-24 MED ORDER — ALPRAZOLAM 1 MG PO TABS: 1.0000 mg | ORAL_TABLET | Freq: Four times a day (QID) | ORAL | Status: AC

## 2012-02-24 NOTE — Telephone Encounter (Signed)
I printed both scripts for pick up and pt is aware.

## 2012-02-27 ENCOUNTER — Telehealth: Payer: Self-pay | Admitting: *Deleted

## 2012-02-27 NOTE — Telephone Encounter (Signed)
Pt did not need the Xanax script right now, she still had refills.

## 2012-02-27 NOTE — Telephone Encounter (Signed)
Pt was given a rx for oxycodone 10-325 #40 approved by Dr. Fabian Sharp on 02/24/12.  Pt states she normally gets #180 and will need another rx when Dr. Clent Ridges returns to the office.

## 2012-03-01 ENCOUNTER — Telehealth: Payer: Self-pay | Admitting: Family Medicine

## 2012-03-01 MED ORDER — OXYCODONE-ACETAMINOPHEN 10-325 MG PO TABS: 1.0000 | ORAL_TABLET | Freq: Four times a day (QID) | ORAL | Status: DC | PRN

## 2012-03-01 MED ORDER — OXYCODONE-ACETAMINOPHEN 10-325 MG PO TABS: 1.0000 | ORAL_TABLET | ORAL | Status: DC | PRN

## 2012-03-01 NOTE — Telephone Encounter (Signed)
done

## 2012-03-01 NOTE — Telephone Encounter (Signed)
Script is ready and left voice message for pt. 

## 2012-03-01 NOTE — Telephone Encounter (Signed)
She normally gets #180 at a time, so I will destroy the rx for #120 and write a new one for #180

## 2012-03-03 ENCOUNTER — Ambulatory Visit: Payer: Self-pay | Admitting: Physical Therapy

## 2012-03-04 ENCOUNTER — Ambulatory Visit: Payer: Self-pay | Admitting: Rehabilitative and Restorative Service Providers"

## 2012-03-04 DIAGNOSIS — Z0279 Encounter for issue of other medical certificate: Secondary | ICD-10-CM

## 2012-03-15 ENCOUNTER — Ambulatory Visit: Payer: Self-pay | Admitting: Physical Therapy

## 2012-03-15 ENCOUNTER — Telehealth: Payer: Self-pay | Admitting: Family Medicine

## 2012-03-15 NOTE — Telephone Encounter (Signed)
I filled out papers last week for her disability, and we faxed these off. She just picked up #180 of the Percocet 2 weeks ago, so she will need to use these up before we can give her any hydrocodone (about 2 weeks from now)

## 2012-03-15 NOTE — Telephone Encounter (Signed)
Pt called and stated that she would like to try Hydrocodone. Pt said she feels it will work better then [employer]. Pt feel the Percocet is not helping her pain

## 2012-03-15 NOTE — Telephone Encounter (Signed)
Pt req to come up here and pick up script for Hydrocodone 10 today, when she comes up here to see if paperwork Dr Clent Ridges was suppose to complete. Pt says that she will come up her about 2:30pm today.

## 2012-03-15 NOTE — Telephone Encounter (Signed)
I tried to reach pt by phone, no answer. There is a copy of the paperwork up front and ready for pick up. Pt can call back in about 2 weeks and we will make the change in the medication.

## 2012-03-23 ENCOUNTER — Ambulatory Visit (INDEPENDENT_AMBULATORY_CARE_PROVIDER_SITE_OTHER): Payer: Self-pay | Admitting: Family Medicine

## 2012-03-23 ENCOUNTER — Encounter: Payer: Self-pay | Admitting: Family Medicine

## 2012-03-23 VITALS — BP 124/74 | HR 105 | Temp 98.7°F | Wt 194.0 lb

## 2012-03-23 DIAGNOSIS — M545 Low back pain: Secondary | ICD-10-CM

## 2012-03-23 MED ORDER — HYDROCODONE-ACETAMINOPHEN 10-325 MG PO TABS: 1.0000 | ORAL_TABLET | Freq: Four times a day (QID) | ORAL | Status: DC | PRN

## 2012-03-23 NOTE — Progress Notes (Signed)
  Subjective:    Patient ID: Lynn Wallace, female    DOB: 1971/03/16, 41 y.o.   MRN: [id number]  HPI Here to ask advice. She is working with Manufacturing systems engineer, and they had her see Dr. Delma Freeze again for testing. She saw him once last week and will se him again later this week. She is still having pain in the back and down the right leg, but now she is having weakness in the leg as well. When she drives for any length of time the leg gets weak and starts to quiver. She wants to try Hydrocodone instead of Percocet for a short trial.    Review of Systems  Musculoskeletal: Positive for back pain.  Neurological: Positive for weakness.       Objective:   Physical Exam  Constitutional: She appears well-developed and well-nourished.  Musculoskeletal:       Tender in the lower right back           Assessment & Plan:  Try a short trial of Vicodin. Follow up with Dr. Allena Katz as above

## 2012-03-24 ENCOUNTER — Ambulatory Visit: Payer: No Typology Code available for payment source | Attending: Family Medicine

## 2012-03-30 ENCOUNTER — Other Ambulatory Visit: Payer: Self-pay | Admitting: Family Medicine

## 2012-03-30 NOTE — Telephone Encounter (Signed)
Pt needs new rx percocet °

## 2012-04-01 MED ORDER — OXYCODONE-ACETAMINOPHEN 10-325 MG PO TABS: 1.0000 | ORAL_TABLET | ORAL | Status: DC | PRN

## 2012-04-01 NOTE — Telephone Encounter (Signed)
Script is ready, tried to reach pt and no answer. 

## 2012-04-01 NOTE — Telephone Encounter (Signed)
done

## 2012-04-26 ENCOUNTER — Other Ambulatory Visit: Payer: Self-pay | Admitting: Family Medicine

## 2012-04-26 NOTE — Telephone Encounter (Signed)
Pt needs new rx percocet due on 05-01-2012(sat)

## 2012-04-27 MED ORDER — OXYCODONE-ACETAMINOPHEN 10-325 MG PO TABS: 1.0000 | ORAL_TABLET | ORAL | Status: DC | PRN

## 2012-04-27 NOTE — Telephone Encounter (Signed)
done

## 2012-04-27 NOTE — Telephone Encounter (Signed)
Script is ready and I spoke with pt. 

## 2012-05-14 ENCOUNTER — Encounter: Payer: Self-pay | Admitting: Family Medicine

## 2012-05-14 ENCOUNTER — Ambulatory Visit (INDEPENDENT_AMBULATORY_CARE_PROVIDER_SITE_OTHER): Payer: Self-pay | Admitting: Family Medicine

## 2012-05-14 VITALS — BP 122/72 | HR 102 | Temp 98.1°F | Wt 195.0 lb

## 2012-05-14 DIAGNOSIS — M545 Low back pain: Secondary | ICD-10-CM

## 2012-05-14 MED ORDER — HYDROMORPHONE HCL 4 MG PO TABS: 4.0000 mg | ORAL_TABLET | ORAL | Status: AC | PRN

## 2012-05-17 ENCOUNTER — Telehealth: Payer: Self-pay | Admitting: Family Medicine

## 2012-05-17 NOTE — Telephone Encounter (Signed)
Pt is return Lynn Wallace please call pt at [phone]

## 2012-05-17 NOTE — Telephone Encounter (Signed)
Pt would like a call back on [phone]

## 2012-05-17 NOTE — Telephone Encounter (Signed)
Pt stated hydromorphone had her like a zombie no pain. Pt would like to go back to percocet

## 2012-05-17 NOTE — Progress Notes (Signed)
  Subjective:    Patient ID: Lynn Wallace, female    DOB: 06-01-1971, 41 y.o.   MRN: [id number]  HPI Here to follow up on chronic back pain. She says it is getting worse, and she insists that something needs to be done about it. Most of the pain is in the lower back, but it extends up to the neck as well. She has pains that radiates into both legs also. She had an MRI scan in march of 2012 which did not reveal any significant neurologic impingement, so this does not seem to be of a surgical nature. She has been using Percocet for pain, but these are no longer very effective for her.    Review of Systems  Constitutional: Negative.   Musculoskeletal: Positive for back pain.  Neurological: Negative.        Objective:   Physical Exam  Constitutional: She appears well-developed and well-nourished.  Musculoskeletal:       Some stiffness and tenderness in the lower back with reduced ROM          Assessment & Plan:  Chronic back pain that is disabling to her. We will try some Dilaudid that she can use prn. We will try to refer to someone that can help who takes her medical card (the [facility]).

## 2012-05-17 NOTE — Telephone Encounter (Signed)
Try taking 1/2 of tablet of Dilaudid at a time

## 2012-05-17 NOTE — Telephone Encounter (Signed)
I left voice message with below information. 

## 2012-05-17 NOTE — Telephone Encounter (Signed)
We can stick with Percocet if she likes. I will be out of the office next week, so she can come by this Friday to get a refill

## 2012-05-18 ENCOUNTER — Telehealth: Payer: Self-pay | Admitting: Family Medicine

## 2012-05-18 NOTE — Telephone Encounter (Signed)
Caller: Lynn Wallace/Patient; Phone Number: [phone]; Message from caller: Caller: Lynn Wallace/Patient is calling to speak with Nettie Elm about Hydromorphone and is refusing to take medication because she does not like it.

## 2012-05-18 NOTE — Telephone Encounter (Signed)
I tried to call pt,no answer. 

## 2012-05-18 NOTE — Telephone Encounter (Signed)
I spoke with pt and she will pick up script on 05/21/12.

## 2012-05-20 NOTE — Telephone Encounter (Signed)
lmo home/cell for pt to return my call

## 2012-05-20 NOTE — Telephone Encounter (Signed)
Pt is aware rx will be ready this friday

## 2012-05-21 ENCOUNTER — Telehealth: Payer: Self-pay | Admitting: Family Medicine

## 2012-05-21 MED ORDER — OXYCODONE-ACETAMINOPHEN 10-325 MG PO TABS: 1.0000 | ORAL_TABLET | ORAL | Status: DC | PRN

## 2012-05-21 NOTE — Telephone Encounter (Signed)
Given to her  

## 2012-06-07 ENCOUNTER — Telehealth: Payer: Self-pay | Admitting: Family Medicine

## 2012-06-07 NOTE — Telephone Encounter (Signed)
Pt is requesting callback before 3pm

## 2012-06-07 NOTE — Telephone Encounter (Signed)
Pt is out of refills on Xanax according to pharmacy. Would like another Rx sent in to [facility] pharmacy. Pt requesting them before 5pm today, she is out and states she cannot sleep without them.

## 2012-06-08 MED ORDER — ALPRAZOLAM 2 MG PO TABS: 2.0000 mg | ORAL_TABLET | Freq: Four times a day (QID) | ORAL | Status: AC | PRN

## 2012-06-08 NOTE — Telephone Encounter (Signed)
Call in Xanax 2 mg 4 times a day, #120 with 5 rf

## 2012-06-08 NOTE — Telephone Encounter (Signed)
I called in script to [facility] pharmacy.

## 2012-06-11 ENCOUNTER — Telehealth: Payer: Self-pay | Admitting: Family Medicine

## 2012-06-11 NOTE — Telephone Encounter (Signed)
Patient called stating that she need a refill of her percocet and would like to pick that up on next Friday. Please advise and inform patient of advice.

## 2012-06-14 NOTE — Telephone Encounter (Signed)
Pt called to check on status of getting refill of Oxycodone. Req to pick up by Thurs 06/17/12 in a.m. Because pt is leaving town. Pls call when ready for pick up.

## 2012-06-15 MED ORDER — OXYCODONE-ACETAMINOPHEN 10-325 MG PO TABS: 1.0000 | ORAL_TABLET | ORAL | Status: DC | PRN

## 2012-06-15 NOTE — Telephone Encounter (Signed)
Script is ready and up front

## 2012-06-15 NOTE — Telephone Encounter (Signed)
Ready for her to pick on Thursday morning

## 2012-06-18 ENCOUNTER — Ambulatory Visit: Payer: Self-pay | Admitting: Family Medicine

## 2012-06-21 ENCOUNTER — Ambulatory Visit: Payer: Self-pay | Admitting: Family Medicine

## 2012-06-21 ENCOUNTER — Inpatient Hospital Stay (HOSPITAL_COMMUNITY)
Admission: AD | Admit: 2012-06-21 | Discharge: 2012-06-21 | Disposition: A | Discharge status: Home / Self Care | Payer: Self-pay | Source: Ambulatory Visit | Attending: Family Medicine | Admitting: Family Medicine

## 2012-06-21 ENCOUNTER — Inpatient Hospital Stay (HOSPITAL_COMMUNITY): Payer: Self-pay

## 2012-06-21 DIAGNOSIS — N949 Unspecified condition associated with female genital organs and menstrual cycle: Secondary | ICD-10-CM | POA: Insufficient documentation

## 2012-06-21 DIAGNOSIS — N938 Other specified abnormal uterine and vaginal bleeding: Secondary | ICD-10-CM | POA: Insufficient documentation

## 2012-06-21 DIAGNOSIS — N939 Abnormal uterine and vaginal bleeding, unspecified: Secondary | ICD-10-CM

## 2012-06-21 LAB — URINE MICROSCOPIC-ADD ON

## 2012-06-21 LAB — URINALYSIS, ROUTINE W REFLEX MICROSCOPIC
Glucose, UA: NEGATIVE mg/dL
Ketones, ur: NEGATIVE mg/dL
Protein, ur: NEGATIVE mg/dL
Urobilinogen, UA: 0.2 mg/dL (ref 0.0–1.0)

## 2012-06-21 LAB — CBC
HCT: 45.5 % (ref 36.0–46.0)
MCH: 33.1 pg (ref 26.0–34.0)
MCV: 99.1 fL (ref 78.0–100.0)
Platelets: 218 10*3/uL (ref 150–400)
RDW: 12.5 % (ref 11.5–15.5)

## 2012-06-21 LAB — WET PREP, GENITAL
Clue Cells Wet Prep HPF POC: NONE SEEN
Trich, Wet Prep: NONE SEEN
Yeast Wet Prep HPF POC: NONE SEEN

## 2012-06-21 MED ORDER — CYCLOBENZAPRINE HCL 10 MG PO TABS: 5.0000 mg | ORAL_TABLET | Freq: Once | ORAL | Status: DC

## 2012-06-21 MED ORDER — ONDANSETRON 8 MG PO TBDP
8.0000 mg | ORAL_TABLET | Freq: Once | ORAL | Status: AC
  Administered 2012-06-21: 8 mg via ORAL
  Filled 2012-06-21: qty 1

## 2012-06-21 MED ORDER — CYCLOBENZAPRINE HCL 5 MG PO TABS: 5.0000 mg | ORAL_TABLET | Freq: Once | ORAL | Status: DC

## 2012-06-21 MED ORDER — ONDANSETRON 8 MG PO TBDP: 8.0000 mg | ORAL_TABLET | Freq: Three times a day (TID) | ORAL | Status: AC | PRN

## 2012-06-21 NOTE — MAU Note (Signed)
Chronic back pain.  Pain is in low to mid back, around to lrt lower abd.  Has been spotting for a month, some days red, some days brown.  Been feeling sick for a wk, no appetite. No energy.

## 2012-06-21 NOTE — MAU Provider Note (Signed)
History     CSN: [id number]  Arrival date and time: 06/21/12 1623   None     Chief Complaint  Patient presents with  . Back Pain  . Abdominal Pain  . Vaginal Bleeding   HPI Lynn Wallace is a 41 y.o. female who presents to MAU with vaginal bleeding. The bleeding started 1 month ago. She describes the bleeding as light/spotting. The color is dark. Associated symptoms include abdominal pain, nausea and vomiting that started 5 days ago. She has low back pain but has chronic back pain. Last normal period last month, bleeding off and on since. Concerned because her mother had cervical cancer. Last pap smear in PCP office less than one year ago and was normal. Not sexually active in 5 years.  The history was provided by the patient.  OB History    Grav Para Term Preterm Abortions TAB SAB Ect Mult Living                  Past Medical History  Diagnosis Date  . Polysubstance abuse     including narcotics, cocaine, benzodiazepines,  and marijuana  . Psychiatric problem     multiple addmissons for detox  . 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 Chronic low back pain   . GERD (gastroesophageal reflux disease)   . Irritable bowel syndrome (IBS)   . Anxiety   . UTI (lower urinary tract infection)     Past Surgical History  Procedure Date  . Tubal ligation   . Dilation and curettage of uterus     Family History  Problem Relation Age of Onset  . Coronary artery disease      Female 1st degree relative <60  . Lung cancer    . Stroke      M 1st degree relative <50  . Arthritis Mother     History  Substance Use Topics  . Smoking status: Current Everyday Smoker -- 1.0 packs/day    Types: Cigarettes  . Smokeless tobacco: Never Used  . Alcohol Use: No    Allergies:  Allergies  Allergen Reactions  . Hydrocodone-Acetaminophen     REACTION: nausea  . Ibuprofen     REACTION: nausea  . Penicillins Hives    Prescriptions prior to admission   Medication Sig Dispense Refill  . alprazolam (XANAX) 2 MG tablet Take 1 tablet (2 mg total) by mouth 4 (four) times daily as needed.  120 tablet  5  . oxyCODONE-acetaminophen (PERCOCET) 10-325 MG per tablet Take 1 tablet by mouth every 4 (four) hours as needed for pain.  180 tablet  0    Review of Systems  Constitutional: Negative for fever, chills and weight loss.  HENT: Positive for congestion. Negative for ear pain, nosebleeds, sore throat and neck pain.   Eyes: Negative for blurred vision, double vision, photophobia and pain.  Respiratory: Negative for cough, shortness of breath and wheezing.   Cardiovascular: Negative for chest pain, palpitations and leg swelling.  Gastrointestinal: Positive for heartburn, nausea and abdominal pain. Negative for diarrhea and constipation.  Genitourinary: Negative for dysuria, urgency and frequency.       Vaginal bleeding, vaginal discharge with odor  Musculoskeletal: Positive for back pain. Negative for myalgias.  Skin: Positive for rash (lesion on left thigh). Negative for itching.  Neurological: Negative for dizziness, sensory change, speech change, seizures, weakness and headaches.  Endo/Heme/Allergies: Does not bruise/bleed easily.  Psychiatric/Behavioral: Positive for depression. The patient  is nervous/anxious.    Physical Exam   Blood pressure 112/73, pulse 62, temperature 98 F (36.7 C), temperature source Oral, resp. rate 20, height 5\' 3"  (1.6 m), weight 189 lb (85.73 kg).  Physical Exam  Constitutional: She is oriented to person, place, and time. She appears well-developed and well-nourished. No distress.  HENT:  Head: Normocephalic and atraumatic.  Eyes: EOM are normal.  Neck: Neck supple.  Cardiovascular: Normal rate.   Respiratory: Effort normal.  GI: Soft. There is no tenderness.  Genitourinary:       External genitalia without lesions. Brown discharge vaginal vault. Positive CMT, bilateral adnexal tenderness, left >right.  Uterus tender on exam.  Musculoskeletal: Normal range of motion.  Neurological: She is alert and oriented to person, place, and time.  Skin: Skin is warm and dry.  Psychiatric: She has a normal mood and affect. Her behavior is normal. Judgment and thought content normal.   Procedures  MDM Care turned over to [place], CNM @ 8pm ultrasound pending. Assessment and Plan    NEESE,HOPE 06/21/2012, 7:09 PM    RADIOLOGY REPORT*  Clinical Data: Abdominal pain, vaginal bleeding. Perimenopausal.  TRANSABDOMINAL AND TRANSVAGINAL ULTRASOUND OF PELVIS  Technique: Both transabdominal and transvaginal ultrasound  examinations of the pelvis were performed. Transabdominal technique  was performed for global imaging of the pelvis including uterus,  ovaries, adnexal regions, and pelvic cul-de-sac.  It was necessary to proceed with endovaginal exam following the  transabdominal exam to visualize the endometrium and adnexa.  Comparison: 01/18/2011 CT  Findings:  Uterus: Normal in size and appearance measuring 8.7 x 4.1 x 5.4 cm.  Small Nabothian cyst.  Endometrium: Normal in thickness, measuring 6 mm.  Right ovary: Normal sonographic appearance, measuring 2.0 x 1.4 x  1.5 cm.  Left ovary: Normal sonographic appearance, measuring 2.4 x 1.1 x  1.7 cm.  Other findings: No free fluid  IMPRESSION:  Normal study. No evidence of pelvic mass or other significant  abnormality.   Assessment/Plan: DUB Pelvic Pain  Discharge home FU in [facility], Staff will call with date/time of appt.   Bayard More E.

## 2012-06-22 LAB — GC/CHLAMYDIA PROBE AMP, GENITAL: GC Probe Amp, Genital: NEGATIVE

## 2012-06-22 NOTE — MAU Provider Note (Signed)
Chart reviewed and agree with management and plan.  

## 2012-06-25 ENCOUNTER — Encounter: Payer: Self-pay | Admitting: Family Medicine

## 2012-06-25 ENCOUNTER — Ambulatory Visit (INDEPENDENT_AMBULATORY_CARE_PROVIDER_SITE_OTHER): Payer: Self-pay | Admitting: Family Medicine

## 2012-06-25 VITALS — BP 120/76 | HR 79 | Temp 97.9°F | Wt 190.0 lb

## 2012-06-25 DIAGNOSIS — K5289 Other specified noninfective gastroenteritis and colitis: Secondary | ICD-10-CM

## 2012-06-25 DIAGNOSIS — K529 Noninfective gastroenteritis and colitis, unspecified: Secondary | ICD-10-CM

## 2012-06-25 MED ORDER — METRONIDAZOLE 500 MG PO TABS: 500.0000 mg | ORAL_TABLET | Freq: Three times a day (TID) | ORAL | Status: AC

## 2012-06-25 NOTE — Progress Notes (Signed)
  Subjective:    Patient ID: Lynn Wallace, female    DOB: [birth year], 41 y.o.   MRN: [id number]  HPI Here for one week of abdominal cramps, nausea and vomiting, and diarrhea. No fever. She is drinking fluids but not keeping much food down. Of note, she was seen at [facility] on 06-21-12 for irregular vaginal bleeding, and she is scheduled for an endometrial biopsy on 07-15-12.   Review of Systems  Constitutional: Negative.   Respiratory: Negative.   Cardiovascular: Negative.   Gastrointestinal: Positive for nausea, vomiting, abdominal pain and diarrhea. Negative for constipation, blood in stool and abdominal distention.       Objective:   Physical Exam  Constitutional: She appears well-developed and well-nourished.  Abdominal: Soft. Bowel sounds are normal. She exhibits no distension and no mass. There is no rebound and no guarding.       Mildly tender diffusely           Assessment & Plan:  Gastroenteritis. Drink fluids. Use Phenergan prn. Given 10 days of Flagyl.

## 2012-07-15 ENCOUNTER — Other Ambulatory Visit: Payer: Self-pay | Admitting: Obstetrics & Gynecology

## 2012-07-19 ENCOUNTER — Other Ambulatory Visit: Payer: Self-pay | Admitting: Family Medicine

## 2012-07-19 NOTE — Telephone Encounter (Signed)
Pt needs new rx percocet by [facility]

## 2012-07-20 MED ORDER — OXYCODONE-ACETAMINOPHEN 10-325 MG PO TABS: 1.0000 | ORAL_TABLET | ORAL | Status: DC | PRN

## 2012-07-20 NOTE — Telephone Encounter (Signed)
done

## 2012-07-20 NOTE — Telephone Encounter (Signed)
I spoke with pt  

## 2012-08-09 ENCOUNTER — Encounter: Payer: Self-pay | Admitting: Obstetrics & Gynecology

## 2012-08-09 ENCOUNTER — Other Ambulatory Visit (HOSPITAL_COMMUNITY)
Admission: RE | Admit: 2012-08-09 | Discharge: 2012-08-09 | Disposition: A | Discharge status: Home / Self Care | Payer: Self-pay | Source: Ambulatory Visit | Attending: Obstetrics & Gynecology | Admitting: Obstetrics & Gynecology

## 2012-08-09 ENCOUNTER — Ambulatory Visit (INDEPENDENT_AMBULATORY_CARE_PROVIDER_SITE_OTHER): Payer: Self-pay | Admitting: Obstetrics & Gynecology

## 2012-08-09 VITALS — BP 108/77 | HR 69 | Temp 97.4°F | Ht 62.0 in | Wt 200.6 lb

## 2012-08-09 DIAGNOSIS — Z01812 Encounter for preprocedural laboratory examination: Secondary | ICD-10-CM

## 2012-08-09 DIAGNOSIS — N949 Unspecified condition associated with female genital organs and menstrual cycle: Secondary | ICD-10-CM | POA: Insufficient documentation

## 2012-08-09 DIAGNOSIS — N938 Other specified abnormal uterine and vaginal bleeding: Secondary | ICD-10-CM | POA: Insufficient documentation

## 2012-08-09 NOTE — Progress Notes (Signed)
  Subjective:    Patient ID: Lynn Wallace, female    DOB: 1971-08-03, 41 y.o.   MRN: [id number]  HPI  41 yo lady who was seen in the MAU last month for 1 month of irregular bleeding. Her HBG was 15.2 and u/s wnl with 6 mm uterine lining. She was sent here for a EMBX. Her cervical cultures were negative. She does give a h/o of hot flashes.  Review of Systems     Objective:   Physical Exam  UPT negative, Consent signed, time out done Cervix prepped with betadine and grasped with single tooth tenaculum. Pipelle passed twice with a moderate amount of tissue. She tolerated the procedure well.      Assessment & Plan:  DUB/hot flashes. Probably perimenopausal bleeding. I will await the [facility] results. RTC 2 weeks.

## 2012-08-16 ENCOUNTER — Telehealth: Payer: Self-pay | Admitting: Family Medicine

## 2012-08-16 NOTE — Telephone Encounter (Signed)
Patient called stating that she need a refill of her percocet for Friday. Please assist.

## 2012-08-16 NOTE — Telephone Encounter (Signed)
I changed Xanax in pt's chart, should be on 2 mg per Dr. Clent Ridges.

## 2012-08-18 ENCOUNTER — Telehealth: Payer: Self-pay | Admitting: Medical

## 2012-08-18 NOTE — Telephone Encounter (Signed)
Called pt and informed her of Endo Bx results. She has follow up appt on 08/23/12 @ 1600 for results and plan of care discussion w/MD. Pt voiced understanding.

## 2012-08-18 NOTE — Telephone Encounter (Signed)
Patient called for biopsy results from appointment on 08/10/12. Ok to leave on VM per patient.

## 2012-08-20 ENCOUNTER — Telehealth: Payer: Self-pay | Admitting: Family Medicine

## 2012-08-20 MED ORDER — OXYCODONE-ACETAMINOPHEN 10-325 MG PO TABS: 1.0000 | ORAL_TABLET | ORAL | Status: DC | PRN

## 2012-08-20 NOTE — Telephone Encounter (Signed)
done

## 2012-08-20 NOTE — Telephone Encounter (Signed)
Pt needs percocet refill

## 2012-08-20 NOTE — Telephone Encounter (Signed)
Script is ready for pick up and I spoke with pt.  

## 2012-08-23 ENCOUNTER — Ambulatory Visit: Payer: Self-pay | Admitting: Obstetrics & Gynecology

## 2012-08-26 ENCOUNTER — Other Ambulatory Visit: Payer: Self-pay | Admitting: Family Medicine

## 2012-08-26 NOTE — Telephone Encounter (Signed)
Pt needs new rx tussinex 8 oz bottle call into [facility]

## 2012-08-27 ENCOUNTER — Telehealth: Payer: Self-pay | Admitting: Family Medicine

## 2012-08-27 NOTE — Telephone Encounter (Signed)
lmom at cell/home # for pt to callback

## 2012-08-27 NOTE — Telephone Encounter (Signed)
Can you call and offer a office visit?

## 2012-08-27 NOTE — Telephone Encounter (Signed)
Pt decline ov. Pt is trying nyquil

## 2012-08-27 NOTE — Telephone Encounter (Signed)
NO. If she is coughing that badly she needs to be seen

## 2012-08-27 NOTE — Telephone Encounter (Signed)
Pt called and said to disregard script for Tussinex. Pt said that she has gotten some otc Nyquil for her cough.

## 2012-09-01 ENCOUNTER — Telehealth: Payer: Self-pay | Admitting: Family Medicine

## 2012-09-01 ENCOUNTER — Ambulatory Visit: Payer: Self-pay | Admitting: Family Medicine

## 2012-09-01 MED ORDER — HYDROCOD POLST-CHLORPHEN POLST 10-8 MG/5ML PO LQCR: 5.0000 mL | Freq: Two times a day (BID) | ORAL | Status: DC | PRN

## 2012-09-01 MED ORDER — HYDROCODONE-HOMATROPINE 5-1.5 MG/5ML PO SYRP
5.0000 mL | ORAL_SOLUTION | ORAL | Status: DC | PRN
Start: 2012-09-01 — End: 2012-10-18

## 2012-09-01 NOTE — Telephone Encounter (Signed)
Patient called stating that she would like to have tussinex called into [facility] as hthe nyquil did not work. Please advise/assist.

## 2012-09-01 NOTE — Telephone Encounter (Signed)
I called in script and spoke with pt. 

## 2012-09-01 NOTE — Telephone Encounter (Signed)
I spoke with pt, the tussionex is too expensive, per Dr. Clent Ridges okay to change to hydromet and I did call in to [facility].

## 2012-09-01 NOTE — Telephone Encounter (Signed)
Per Dr. Clent Ridges, pt does need to be seen and she is on the schedule for today.

## 2012-09-01 NOTE — Telephone Encounter (Signed)
Call in Tussionex to take one tsp bid prn cough, 240 ml with no rf 

## 2012-09-01 NOTE — Telephone Encounter (Signed)
Pt called and had ov sch today but can not come in because she does not have any transportation. Pt is very sick with sore throat and is req a cough med to be called in to pharmacy asap. Pt req call back from [place] asap today.

## 2012-09-13 ENCOUNTER — Telehealth: Payer: Self-pay | Admitting: Family Medicine

## 2012-09-13 MED ORDER — OXYCODONE-ACETAMINOPHEN 10-325 MG PO TABS: 1.0000 | ORAL_TABLET | ORAL | Status: DC | PRN

## 2012-09-13 NOTE — Telephone Encounter (Signed)
Pt needs new rx percocet °

## 2012-09-13 NOTE — Telephone Encounter (Signed)
done

## 2012-09-27 ENCOUNTER — Telehealth: Payer: Self-pay | Admitting: Family Medicine

## 2012-09-27 NOTE — Telephone Encounter (Signed)
Pt called and is req call back from [place] today.
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
## 2012-09-27 NOTE — Telephone Encounter (Signed)
I cannot rewrite these prescriptions. It is her responsibility to get her meds the best way she can

## 2012-09-27 NOTE — Telephone Encounter (Signed)
I spoke with pt  

## 2012-09-28 ENCOUNTER — Ambulatory Visit (INDEPENDENT_AMBULATORY_CARE_PROVIDER_SITE_OTHER): Payer: Self-pay | Admitting: Family Medicine

## 2012-09-28 ENCOUNTER — Encounter: Payer: Self-pay | Admitting: Family Medicine

## 2012-09-28 VITALS — BP 120/80 | HR 92 | Temp 98.4°F | Wt 190.0 lb

## 2012-09-28 DIAGNOSIS — F411 Generalized anxiety disorder: Secondary | ICD-10-CM

## 2012-09-28 DIAGNOSIS — F329 Major depressive disorder, single episode, unspecified: Secondary | ICD-10-CM

## 2012-09-28 DIAGNOSIS — M545 Low back pain: Secondary | ICD-10-CM

## 2012-09-28 DIAGNOSIS — R110 Nausea: Secondary | ICD-10-CM

## 2012-09-28 MED ORDER — PROMETHAZINE HCL 50 MG/ML IJ SOLN
50.0000 mg | Freq: Once | INTRAMUSCULAR | Status: AC
  Administered 2012-09-28: 50 mg via INTRAMUSCULAR

## 2012-09-28 MED ORDER — ALPRAZOLAM 2 MG PO TABS: 2.0000 mg | ORAL_TABLET | Freq: Four times a day (QID) | ORAL | Status: DC | PRN

## 2012-09-28 MED ORDER — OXYCODONE-ACETAMINOPHEN 10-325 MG PO TABS: 1.0000 | ORAL_TABLET | Freq: Three times a day (TID) | ORAL | Status: DC | PRN

## 2012-09-28 NOTE — Addendum Note (Signed)
Addended by: Aniceto Boss A on: 09/28/2012 12:59 PM   Modules accepted: Orders
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
## 2012-10-05 ENCOUNTER — Ambulatory Visit: Payer: Self-pay | Admitting: Family Medicine

## 2012-10-08 ENCOUNTER — Encounter: Payer: Self-pay | Admitting: Family Medicine

## 2012-10-08 ENCOUNTER — Ambulatory Visit: Payer: Self-pay | Admitting: Family Medicine

## 2012-10-08 ENCOUNTER — Ambulatory Visit (INDEPENDENT_AMBULATORY_CARE_PROVIDER_SITE_OTHER): Payer: Self-pay | Admitting: Family Medicine

## 2012-10-08 VITALS — BP 122/80 | HR 90 | Temp 98.0°F | Wt 206.0 lb

## 2012-10-08 DIAGNOSIS — Z209 Contact with and (suspected) exposure to unspecified communicable disease: Secondary | ICD-10-CM

## 2012-10-08 DIAGNOSIS — S0081XA Abrasion of other part of head, initial encounter: Secondary | ICD-10-CM

## 2012-10-08 DIAGNOSIS — IMO0002 Reserved for concepts with insufficient information to code with codable children: Secondary | ICD-10-CM

## 2012-10-08 LAB — HEPATIC FUNCTION PANEL
ALT: 15 U/L (ref 0–35)
AST: 21 U/L (ref 0–37)
Albumin: 3.6 g/dL (ref 3.5–5.2)
Total Bilirubin: 0.3 mg/dL (ref 0.3–1.2)

## 2012-10-08 LAB — CBC WITH DIFFERENTIAL/PLATELET
Basophils Absolute: 0 10*3/uL (ref 0.0–0.1)
Eosinophils Absolute: 0 10*3/uL (ref 0.0–0.7)
Hemoglobin: 12.8 g/dL (ref 12.0–15.0)
Lymphocytes Relative: 34.1 % (ref 12.0–46.0)
MCHC: 33.1 g/dL (ref 30.0–36.0)
Monocytes Relative: 12.6 % — ABNORMAL HIGH (ref 3.0–12.0)
Neutro Abs: 3 10*3/uL (ref 1.4–7.7)
Platelets: 234 10*3/uL (ref 150.0–400.0)
RDW: 13.3 % (ref 11.5–14.6)

## 2012-10-08 LAB — BASIC METABOLIC PANEL
BUN: 9 mg/dL (ref 6–23)
CO2: 28 mEq/L (ref 19–32)
Calcium: 8.9 mg/dL (ref 8.4–10.5)
Creatinine, Ser: 0.7 mg/dL (ref 0.4–1.2)
GFR: 102.27 mL/min (ref >60.00)
Glucose, Bld: 85 mg/dL (ref 70–99)
Sodium: 138 mEq/L (ref 135–145)

## 2012-10-08 MED ORDER — FUROSEMIDE 20 MG PO TABS: 20.0000 mg | ORAL_TABLET | Freq: Every day | ORAL | Status: DC

## 2012-10-08 MED ORDER — OXYCODONE-ACETAMINOPHEN 10-325 MG PO TABS: 1.0000 | ORAL_TABLET | Freq: Three times a day (TID) | ORAL | Status: DC | PRN
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
## 2012-10-08 NOTE — Addendum Note (Signed)
Addended by: Gershon [place] A on: 10/08/2012 03:14 PM   Modules accepted: Orders

## 2012-10-09 LAB — HEPATITIS C ANTIBODY: HCV Ab: NEGATIVE

## 2012-10-09 LAB — HEPATITIS B SURFACE ANTIGEN: Hepatitis B Surface Ag: NEGATIVE

## 2012-10-11 NOTE — Progress Notes (Signed)
Quick Note:  I spoke with pt ______ 

## 2012-10-14 ENCOUNTER — Telehealth: Payer: Self-pay | Admitting: Family Medicine

## 2012-10-14 MED ORDER — OXYCODONE-ACETAMINOPHEN 10-325 MG PO TABS: 1.0000 | ORAL_TABLET | ORAL | Status: DC | PRN

## 2012-10-14 NOTE — Telephone Encounter (Signed)
Given to the patient. This will replace the one written fo #90 at her last appt. She brought in the old rx today for [place] to shred.

## 2012-10-18 ENCOUNTER — Encounter: Payer: Self-pay | Admitting: Family Medicine

## 2012-10-18 ENCOUNTER — Ambulatory Visit (INDEPENDENT_AMBULATORY_CARE_PROVIDER_SITE_OTHER): Payer: Self-pay | Admitting: Family Medicine

## 2012-10-18 VITALS — BP 118/80 | HR 90 | Temp 97.6°F | Wt 200.0 lb

## 2012-10-18 DIAGNOSIS — F3289 Other specified depressive episodes: Secondary | ICD-10-CM

## 2012-10-18 DIAGNOSIS — F411 Generalized anxiety disorder: Secondary | ICD-10-CM

## 2012-10-18 DIAGNOSIS — F329 Major depressive disorder, single episode, unspecified: Secondary | ICD-10-CM

## 2012-10-18 MED ORDER — FLUOXETINE HCL 20 MG PO TABS: 20.0000 mg | ORAL_TABLET | Freq: Every day | ORAL | Status: DC

## 2012-10-18 NOTE — Progress Notes (Signed)
  Subjective:    Patient ID: Lynn Wallace, female    DOB: [birth year], 41 y.o.   MRN: [id number]  HPI Here asking for help with "bipolar disorder". She carries this diagnosis in her chart, but I am not sure where it originated. She has a long hx of anxiety and takes Xanax for this. However she also has very sudden and dramatic mood swings, from feeling sad to angry to euphoric. She sleeps well.    Review of Systems  Constitutional: Negative.   Psychiatric/Behavioral: Positive for dysphoric mood, decreased concentration and agitation. Negative for hallucinations, behavioral problems and confusion. The patient is nervous/anxious. The patient is not hyperactive.        Objective:   Physical Exam  Constitutional: She is oriented to person, place, and time. She appears well-developed and well-nourished.  Neurological: She is alert and oriented to person, place, and time.  Psychiatric: She has a normal mood and affect. Her behavior is normal. Judgment and thought content normal.          Assessment & Plan:  Probable bipolar disorder. Try Prozac 20 mg a day. Recheck in one month

## 2012-11-01 ENCOUNTER — Telehealth: Payer: Self-pay | Admitting: Family Medicine

## 2012-11-01 MED ORDER — ALPRAZOLAM 2 MG PO TABS: 2.0000 mg | ORAL_TABLET | Freq: Four times a day (QID) | ORAL | Status: DC | PRN

## 2012-11-01 NOTE — Telephone Encounter (Signed)
Call in #120 with 2 rf 

## 2012-11-01 NOTE — Telephone Encounter (Signed)
I called in script 

## 2012-11-01 NOTE — Telephone Encounter (Signed)
Patient called stating that she need a refill of her xanax 2 mg take 4times per day as needed for anxiety. Please assist.

## 2012-11-02 ENCOUNTER — Telehealth: Payer: Self-pay | Admitting: Family Medicine

## 2012-11-02 MED ORDER — ALPRAZOLAM 2 MG PO TABS: 2.0000 mg | ORAL_TABLET | Freq: Four times a day (QID) | ORAL | Status: DC | PRN

## 2012-11-02 NOTE — Telephone Encounter (Signed)
I called in script to [facility] pharmacy and tried to reach pt, no answer.

## 2012-11-02 NOTE — Telephone Encounter (Signed)
Lynn Wallace called - her Xanax does NOT go to [facility] - per pt it is to go to [facility] or she will come in and pick up a hard copy. Wants the rx retracted from [facility].  Thanks.

## 2012-11-08 ENCOUNTER — Telehealth: Payer: Self-pay | Admitting: Family Medicine

## 2012-11-08 NOTE — Telephone Encounter (Signed)
Pt requesting refill on Percocet. Please call when ready for pick up.

## 2012-11-09 MED ORDER — OXYCODONE-ACETAMINOPHEN 10-325 MG PO TABS: 1.0000 | ORAL_TABLET | ORAL | Status: DC | PRN

## 2012-11-09 NOTE — Telephone Encounter (Signed)
done

## 2012-11-09 NOTE — Telephone Encounter (Signed)
Pt informed

## 2012-11-10 ENCOUNTER — Telehealth: Payer: Self-pay | Admitting: Family Medicine

## 2012-11-10 NOTE — Telephone Encounter (Signed)
Note was written, please fax it in

## 2012-11-10 NOTE — Telephone Encounter (Signed)
Pt states that Dr. Clent Ridges suggested she go to dentist for her tooth issue. On the orange card she has, she can go to the [facility] on [address] The clinic states we must fax them a referral stating it's ok to pull the tooth? Please advise. The fax # at the clinic is: [phone] ATTN: Carollee Herter.

## 2012-11-11 NOTE — Telephone Encounter (Signed)
Note was faxed to below number. 

## 2012-11-15 ENCOUNTER — Ambulatory Visit (INDEPENDENT_AMBULATORY_CARE_PROVIDER_SITE_OTHER): Payer: No Typology Code available for payment source | Admitting: Family Medicine

## 2012-11-15 ENCOUNTER — Encounter: Payer: Self-pay | Admitting: Family Medicine

## 2012-11-15 VITALS — BP 98/72 | Temp 98.3°F | Wt 198.0 lb

## 2012-11-15 DIAGNOSIS — J329 Chronic sinusitis, unspecified: Secondary | ICD-10-CM

## 2012-11-15 DIAGNOSIS — R062 Wheezing: Secondary | ICD-10-CM

## 2012-11-15 MED ORDER — METHYLPREDNISOLONE ACETATE 80 MG/ML IJ SUSP
80.0000 mg | Freq: Once | INTRAMUSCULAR | Status: AC
  Administered 2012-11-15: 80 mg via INTRAMUSCULAR

## 2012-11-15 MED ORDER — AZITHROMYCIN 250 MG PO TABS: ORAL_TABLET | ORAL | Status: AC

## 2012-11-15 NOTE — Patient Instructions (Addendum)

## 2012-11-15 NOTE — Progress Notes (Signed)
  Subjective:    Patient ID: Lynn Wallace, female    DOB: 1971/11/13, 41 y.o.   MRN: [id number]  HPI Acute visit. Headaches, sinus congestion and bilateral frontal maxillary facial pressure progressive over the past week. Upper teeth pain. Cough productive of brown sputum. No fever. Reported history of allergy to penicillin but she's taken amoxicillin.  Patient is smoking but trying to decrease. She has history of chronic back pain and takes Percocet regularly.   Review of Systems  Constitutional: Negative for fever and chills.  HENT: Positive for congestion and sinus pressure.   Respiratory: Positive for cough and wheezing. Negative for shortness of breath.   Cardiovascular: Negative for chest pain.  Gastrointestinal: Negative for nausea and vomiting.  Neurological: Positive for headaches.       Objective:   Physical Exam  Constitutional: She appears well-developed and well-nourished.  HENT:  Right Ear: External ear normal.  Left Ear: External ear normal.       Whitish color tongue but no evidence for thrush buccal mucosa or otherwise. Posterior pharynx clear  Cardiovascular: Normal rate and regular rhythm.   Pulmonary/Chest:       Patient has diffuse wheezes. No rales. No retractions          Assessment & Plan:  Acute sinusitis. She has cough with reactive airway component. Depo-Medrol 80 mg IM given. Zithromax for 5 days. She is encouraged to stop smoking

## 2012-12-02 ENCOUNTER — Encounter: Payer: Self-pay | Admitting: Family Medicine

## 2012-12-02 ENCOUNTER — Ambulatory Visit (INDEPENDENT_AMBULATORY_CARE_PROVIDER_SITE_OTHER): Payer: No Typology Code available for payment source | Admitting: Family Medicine

## 2012-12-02 VITALS — BP 102/70 | HR 81 | Temp 98.0°F | Wt 196.0 lb

## 2012-12-02 DIAGNOSIS — J40 Bronchitis, not specified as acute or chronic: Secondary | ICD-10-CM

## 2012-12-02 MED ORDER — DOXYCYCLINE HYCLATE 100 MG PO CAPS: 100.0000 mg | ORAL_CAPSULE | Freq: Two times a day (BID) | ORAL | Status: DC

## 2012-12-02 NOTE — Progress Notes (Signed)
  Subjective:    Patient ID: Lynn Wallace, female    DOB: [birth year], 42 y.o.   MRN: [id number]  HPI Here for one week of PND, chest congestion and coughing up green sputum. No fever.    Review of Systems  Constitutional: Negative.   HENT: Negative.   Eyes: Negative.   Respiratory: Positive for cough and chest tightness.        Objective:   Physical Exam  Constitutional: She appears well-developed and well-nourished.  HENT:  Right Ear: External ear normal.  Left Ear: External ear normal.  Nose: Nose normal.  Mouth/Throat: Oropharynx is clear and moist.  Eyes: Conjunctivae normal are normal.  Pulmonary/Chest: Effort normal and breath sounds normal.  Lymphadenopathy:    She has no cervical adenopathy.          Assessment & Plan:  Add Mucinex

## 2012-12-06 ENCOUNTER — Other Ambulatory Visit: Payer: Self-pay | Admitting: Family Medicine

## 2012-12-06 MED ORDER — OXYCODONE-ACETAMINOPHEN 10-325 MG PO TABS: 1.0000 | ORAL_TABLET | ORAL | Status: DC | PRN

## 2012-12-06 NOTE — Telephone Encounter (Signed)
Script is ready for pick up and I spoke with pt.  

## 2012-12-06 NOTE — Telephone Encounter (Signed)
Pt needs refill of oxyCODONE-acetaminophen (PERCOCET) 10-325 MG per tablet. Thank you!  

## 2012-12-06 NOTE — Telephone Encounter (Signed)
done

## 2012-12-08 ENCOUNTER — Ambulatory Visit (INDEPENDENT_AMBULATORY_CARE_PROVIDER_SITE_OTHER): Payer: No Typology Code available for payment source | Admitting: Family Medicine

## 2012-12-08 ENCOUNTER — Telehealth: Payer: Self-pay | Admitting: Family Medicine

## 2012-12-08 ENCOUNTER — Encounter: Payer: Self-pay | Admitting: Family Medicine

## 2012-12-08 VITALS — BP 100/70 | HR 77 | Temp 97.7°F | Wt 196.0 lb

## 2012-12-08 DIAGNOSIS — L259 Unspecified contact dermatitis, unspecified cause: Secondary | ICD-10-CM

## 2012-12-08 DIAGNOSIS — L309 Dermatitis, unspecified: Secondary | ICD-10-CM

## 2012-12-08 MED ORDER — BETAMETHASONE DIPROPIONATE 0.05 % EX CREA: TOPICAL_CREAM | Freq: Two times a day (BID) | CUTANEOUS | Status: DC

## 2012-12-08 MED ORDER — METHYLPREDNISOLONE ACETATE 80 MG/ML IJ SUSP
120.0000 mg | Freq: Once | INTRAMUSCULAR | Status: AC
  Administered 2012-12-08: 120 mg via INTRAMUSCULAR

## 2012-12-08 NOTE — Telephone Encounter (Signed)
Pt can not afford abx that was sent to [facility]. Pt is requesting abx on [facility] dollar plan. Pt stated md has [facility] list [facility].

## 2012-12-08 NOTE — Progress Notes (Signed)
  Subjective:    Patient ID: Lynn Wallace, female    DOB: 23-Jul-1971, 42 y.o.   MRN: [id number]  HPI Here for an itchy rash that showed up in the elbows and around the neck 5 days ago. OTC creams do not help.    Review of Systems  Constitutional: Negative.   Skin: Positive for rash.       Objective:   Physical Exam  Constitutional: She appears well-developed and well-nourished.  Skin:       Patches of red, macular, scaly skin around the neck and upper chest, also in both antecubital areas           Assessment & Plan:  Given a steroid shot, use Diprolene cream,  recheck prn

## 2012-12-08 NOTE — Telephone Encounter (Signed)
Pt is now on the schedule for today at 3:45 pm.

## 2012-12-09 ENCOUNTER — Telehealth: Payer: Self-pay | Admitting: Family Medicine

## 2012-12-09 ENCOUNTER — Other Ambulatory Visit: Payer: Self-pay | Admitting: Family Medicine

## 2012-12-09 DIAGNOSIS — K0889 Other specified disorders of teeth and supporting structures: Secondary | ICD-10-CM

## 2012-12-09 MED ORDER — FLUOXETINE HCL 20 MG PO CAPS: 20.0000 mg | ORAL_CAPSULE | Freq: Every day | ORAL | Status: DC

## 2012-12-09 NOTE — Telephone Encounter (Signed)
Pt is requesting to change the Prozac from a tablet to a capsule due to cost. Per Dr. Clent Ridges, okay to change and I did send e-scribe.

## 2012-12-09 NOTE — Telephone Encounter (Signed)
All meds MD prescribed yesterday are too expensive for pt.  Pls refer to $4.00 list and substitute if possible. Pt also request that MD send URGENT REQUEST to: [facility]  Fax:  [phone] for removal of bottem back tooth. Tooth is chipped and very painful for pt.

## 2012-12-09 NOTE — Telephone Encounter (Signed)
The referral was done. I cannot write for anything any cheaper than what she has now

## 2012-12-10 MED ORDER — CEPHALEXIN 500 MG PO TABS: 500.0000 mg | ORAL_TABLET | Freq: Three times a day (TID) | ORAL | Status: DC

## 2012-12-10 NOTE — Telephone Encounter (Signed)
Per Dr. Clent Ridges, call in Cephalexin 500 mg take 1 po tid # 30 with 0 refills. I sent script e-scribe and spoke with pt.

## 2012-12-10 NOTE — Telephone Encounter (Signed)
Pt also wants to know if her meds [facility]et) at [facility] that say acet[facility] might be the cause of pt breaking. The pt states this is the only pharm that has that name on her script, and she did not start breaking out until she started taking them. Pls advise

## 2012-12-10 NOTE — Telephone Encounter (Signed)
I strongly doubt she has suddenly developed an allergy to aceteminophen after all these years. It must be something else

## 2012-12-17 ENCOUNTER — Encounter (HOSPITAL_COMMUNITY): Payer: Self-pay | Admitting: *Deleted

## 2012-12-17 ENCOUNTER — Emergency Department (HOSPITAL_COMMUNITY)
Admission: EM | Admit: 2012-12-17 | Discharge: 2012-12-17 | Disposition: A | Discharge status: Home / Self Care | Payer: Self-pay | Attending: Emergency Medicine | Admitting: Emergency Medicine

## 2012-12-17 DIAGNOSIS — F411 Generalized anxiety disorder: Secondary | ICD-10-CM | POA: Insufficient documentation

## 2012-12-17 DIAGNOSIS — Z209 Contact with and (suspected) exposure to unspecified communicable disease: Secondary | ICD-10-CM | POA: Insufficient documentation

## 2012-12-17 DIAGNOSIS — F319 Bipolar disorder, unspecified: Secondary | ICD-10-CM | POA: Insufficient documentation

## 2012-12-17 DIAGNOSIS — Z79899 Other long term (current) drug therapy: Secondary | ICD-10-CM | POA: Insufficient documentation

## 2012-12-17 DIAGNOSIS — Z2089 Contact with and (suspected) exposure to other communicable diseases: Secondary | ICD-10-CM

## 2012-12-17 DIAGNOSIS — Z87440 Personal history of urinary (tract) infections: Secondary | ICD-10-CM | POA: Insufficient documentation

## 2012-12-17 DIAGNOSIS — G8929 Other chronic pain: Secondary | ICD-10-CM | POA: Insufficient documentation

## 2012-12-17 DIAGNOSIS — R21 Rash and other nonspecific skin eruption: Secondary | ICD-10-CM | POA: Insufficient documentation

## 2012-12-17 DIAGNOSIS — F1910 Other psychoactive substance abuse, uncomplicated: Secondary | ICD-10-CM | POA: Insufficient documentation

## 2012-12-17 DIAGNOSIS — F17200 Nicotine dependence, unspecified, uncomplicated: Secondary | ICD-10-CM | POA: Insufficient documentation

## 2012-12-17 DIAGNOSIS — Z8719 Personal history of other diseases of the digestive system: Secondary | ICD-10-CM | POA: Insufficient documentation

## 2012-12-17 DIAGNOSIS — Z8659 Personal history of other mental and behavioral disorders: Secondary | ICD-10-CM | POA: Insufficient documentation

## 2012-12-17 MED ORDER — DIPHENHYDRAMINE HCL 25 MG PO CAPS
25.0000 mg | ORAL_CAPSULE | Freq: Once | ORAL | Status: AC
  Administered 2012-12-17: 25 mg via ORAL
  Filled 2012-12-17: qty 1

## 2012-12-17 MED ORDER — PERMETHRIN 5 % EX CREA: TOPICAL_CREAM | CUTANEOUS | Status: DC

## 2012-12-17 NOTE — ED Notes (Signed)
Med not scanned because patient was in isolation for scabies and scanner not available

## 2012-12-17 NOTE — ED Provider Notes (Signed)
History     CSN: [id number]  Arrival date & time 12/17/12  1709   None     Chief Complaint  Patient presents with  . Rash    HPI Lynn Wallace is a 42 y.o. female who presents to the ED with a rash. The rash started 7 days ago. The rash is located under her breast, in the folds of her abdomen, elbows and other skin folds. There is no rash on the face. She went to her PCP when the rash first started and told it may be psoriasis. She got a shot of steroid, cream and Keflex. The rash is getting worse instead of better. Recently found out that she was exposed to scabies. The history was provided by the patient.  Past Medical History  Diagnosis Date  . Polysubstance abuse     including narcotics, cocaine, benzodiazepines,  and marijuana  . Psychiatric problem     multiple addmissons for detox  . 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 Chronic low back pain   . GERD (gastroesophageal reflux disease)   . Irritable bowel syndrome (IBS)   . Anxiety   . UTI (lower urinary tract infection)     Past Surgical History  Procedure Date  . Tubal ligation   . Dilation and curettage of uterus     Family History  Problem Relation Age of Onset  . Coronary artery disease      Female 1st degree relative <60  . Lung cancer    . Stroke      M 1st degree relative <50  . Arthritis Mother     History  Substance Use Topics  . Smoking status: Current Every Day Smoker -- 1.0 packs/day    Types: Cigarettes  . Smokeless tobacco: Never Used  . Alcohol Use: No    OB History    Grav Para Term Preterm Abortions TAB SAB Ect Mult Living   7 5 5  0 2 1 1   5       Review of Systems  Constitutional: Negative for fever, chills and activity change.  HENT: Negative.   Eyes: Negative.   Respiratory: Negative for cough and wheezing.   Cardiovascular: Negative for chest pain.  Gastrointestinal: Negative for abdominal pain.  Skin: Positive for rash.  Neurological:  Negative for headaches.  Psychiatric/Behavioral: Negative for confusion. The patient is not nervous/anxious.     Allergies  Hydrocodone-acetaminophen; Ibuprofen; and Penicillins  Home Medications   Current Outpatient Rx  Name  Route  Sig  Dispense  Refill  . ALPRAZOLAM 2 MG PO TABS   Oral   Take 1 tablet (2 mg total) by mouth 4 (four) times daily as needed for anxiety.   120 tablet   2   . BETAMETHASONE DIPROPIONATE 0.05 % EX CREA   Topical   Apply topically 2 (two) times daily.   15 g   5   . CEPHALEXIN 500 MG PO TABS   Oral   Take 1 tablet (500 mg total) by mouth 3 (three) times daily.   30 tablet   0   . FLUOXETINE HCL 20 MG PO CAPS   Oral   Take 1 capsule (20 mg total) by mouth daily.   30 capsule   11   . FUROSEMIDE 20 MG PO TABS   Oral   Take 1 tablet (20 mg total) by mouth daily.   30 tablet   5   . OXYCODONE-ACETAMINOPHEN  10-325 MG PO TABS   Oral   Take 1 tablet by mouth every 4 (four) hours as needed for pain.   180 tablet   0     BP 106/77  Pulse 84  Temp 98 F (36.7 C) (Oral)  Resp 18  Ht 5\' 2"  (1.575 m)  Wt 198 lb (89.812 kg)  BMI 36.21 kg/m2  SpO2 95%  LMP 12/10/2012  Physical Exam  Nursing note and vitals reviewed. Constitutional: She is oriented to person, place, and time. She appears well-developed and well-nourished. No distress.  HENT:  Head: Normocephalic and atraumatic.  Eyes: EOM are normal.  Neck: Neck supple.  Cardiovascular: Normal rate.   Pulmonary/Chest: Effort normal.  Musculoskeletal: Normal range of motion. She exhibits no edema.  Neurological: She is alert and oriented to person, place, and time. No cranial nerve deficit.  Skin: Rash noted.       Rash noted under breast bilateral, bends of elbows, lower extremities. The rash is fine, linear in appearance. There are abrasions where the patient has scratched.  Psychiatric: She has a normal mood and affect. Her behavior is normal. Judgment and thought content normal.     Procedures   Assessment: 42 y.o. female with rash  Diff Dx  Scabies   Allergic dermitis   Other skin problem  Plan:  Elimite   Benadryl   Referral to dermatology    Discussed with the patient and all questioned fully answered.   Medication List     As of 12/17/2012  5:50 PM    START taking these medications         permethrin 5 % cream   Commonly known as: ELIMITE   Apply to affected area once      ASK your doctor about these medications         alprazolam 2 MG tablet   Commonly known as: XANAX   Take 1 tablet (2 mg total) by mouth 4 (four) times daily as needed for anxiety.      betamethasone dipropionate 0.05 % cream   Commonly known as: DIPROLENE   Apply topically 2 (two) times daily.      Cephalexin 500 MG tablet   Take 1 tablet (500 mg total) by mouth 3 (three) times daily.      FLUoxetine 20 MG capsule   Commonly known as: PROZAC   Take 1 capsule (20 mg total) by mouth daily.      furosemide 20 MG tablet   Commonly known as: LASIX   Take 1 tablet (20 mg total) by mouth daily.      oxyCODONE-acetaminophen 10-325 MG per tablet   Commonly known as: PERCOCET   Take 1 tablet by mouth every 4 (four) hours as needed for pain.          Where to get your medications    These are the prescriptions that you need to pick up.   You may get these medications from any pharmacy.         permethrin 5 % cream              Janne Napoleon, NP 12/17/12 1750

## 2012-12-17 NOTE — ED Notes (Signed)
Itching rash to body for 10 days, using a creme and keflex for this,

## 2012-12-18 NOTE — ED Provider Notes (Signed)
Medical screening examination/treatment/procedure(s) were performed by non-physician practitioner and as supervising physician I was immediately available for consultation/collaboration.  Yoshito Gaza L Rakeya Glab, MD 12/18/12 0020 

## 2012-12-20 ENCOUNTER — Ambulatory Visit (INDEPENDENT_AMBULATORY_CARE_PROVIDER_SITE_OTHER): Payer: No Typology Code available for payment source | Admitting: Family Medicine

## 2012-12-20 ENCOUNTER — Encounter: Payer: Self-pay | Admitting: Family Medicine

## 2012-12-20 VITALS — BP 100/70 | HR 95 | Temp 97.9°F | Wt 183.0 lb

## 2012-12-20 DIAGNOSIS — R21 Rash and other nonspecific skin eruption: Secondary | ICD-10-CM

## 2012-12-20 MED ORDER — METHYLPREDNISOLONE ACETATE 80 MG/ML IJ SUSP
120.0000 mg | Freq: Once | INTRAMUSCULAR | Status: AC
  Administered 2012-12-20: 120 mg via INTRAMUSCULAR

## 2012-12-20 MED ORDER — OXYCODONE HCL 10 MG PO TABS: 10.0000 mg | ORAL_TABLET | ORAL | Status: DC | PRN

## 2012-12-21 ENCOUNTER — Encounter: Payer: Self-pay | Admitting: Family Medicine

## 2012-12-21 ENCOUNTER — Telehealth: Payer: Self-pay | Admitting: Family Medicine

## 2012-12-21 NOTE — Progress Notes (Signed)
  Subjective:    Patient ID: Lynn Wallace, female    DOB: 25-Jun-1971, 42 y.o.   MRN: [id number]  HPI Here for a worsening of the rash over her arms and legs and trunk. She was seen here a week or so ago for an itchy rash over the chest and neck which appeared at that time to be eczema. She was given a steroid shot and some cortisone cream, but this did not help. The rash has slowly spread since then. She was in the  ED a few days ago and this was felt to be scabies. She was treated with Elimite cream, but the rash has not responded.    Review of Systems  Constitutional: Negative.   Respiratory: Negative.   Cardiovascular: Negative.   Skin: Positive for rash.       Objective:   Physical Exam  Constitutional: She appears well-developed and well-nourished.  Skin:       Macular red rash over arms and legs and trunk          Assessment & Plan:  This is most consistent with a drug eruption rash, and it is definitely not due to scabies. We thought about the possible causes and she thinks she may have reacted like this to Acetominophen in the past. Therefore we will stop her Percocet and change to just Oxycodone. Recheck prn

## 2012-12-21 NOTE — Telephone Encounter (Signed)
I spoke with pt and the doctor is going to change the medication back. Pt will call back a few days before due.

## 2012-12-21 NOTE — Telephone Encounter (Signed)
Pls call pt about switching back to her prior script for oxycodone. Does she need to go and get script back from [facility] if she is going to switch back?

## 2012-12-27 ENCOUNTER — Telehealth: Payer: Self-pay | Admitting: Family Medicine

## 2012-12-27 MED ORDER — OXYCODONE-ACETAMINOPHEN 10-325 MG PO TABS: 1.0000 | ORAL_TABLET | ORAL | Status: DC | PRN

## 2012-12-27 NOTE — Telephone Encounter (Signed)
She wants to go back to Percocet, so she brings the unfilled rx for Oxycodone back in today to be destroyed.

## 2012-12-27 NOTE — Telephone Encounter (Signed)
Script is ready for pick up pt here.

## 2012-12-31 ENCOUNTER — Other Ambulatory Visit: Payer: Self-pay | Admitting: Family Medicine

## 2013-01-16 ENCOUNTER — Emergency Department (HOSPITAL_BASED_OUTPATIENT_CLINIC_OR_DEPARTMENT_OTHER)
Admission: EM | Admit: 2013-01-16 | Discharge: 2013-01-16 | Disposition: A | Discharge status: Home / Self Care | Payer: No Typology Code available for payment source | Attending: Emergency Medicine | Admitting: Emergency Medicine

## 2013-01-16 ENCOUNTER — Encounter (HOSPITAL_BASED_OUTPATIENT_CLINIC_OR_DEPARTMENT_OTHER): Payer: Self-pay | Admitting: *Deleted

## 2013-01-16 ENCOUNTER — Telehealth (HOSPITAL_COMMUNITY): Payer: Self-pay | Admitting: *Deleted

## 2013-01-16 DIAGNOSIS — Z8719 Personal history of other diseases of the digestive system: Secondary | ICD-10-CM | POA: Insufficient documentation

## 2013-01-16 DIAGNOSIS — Z8659 Personal history of other mental and behavioral disorders: Secondary | ICD-10-CM | POA: Insufficient documentation

## 2013-01-16 DIAGNOSIS — K089 Disorder of teeth and supporting structures, unspecified: Secondary | ICD-10-CM | POA: Insufficient documentation

## 2013-01-16 DIAGNOSIS — K219 Gastro-esophageal reflux disease without esophagitis: Secondary | ICD-10-CM | POA: Insufficient documentation

## 2013-01-16 DIAGNOSIS — Z791 Long term (current) use of non-steroidal anti-inflammatories (NSAID): Secondary | ICD-10-CM | POA: Insufficient documentation

## 2013-01-16 DIAGNOSIS — F17200 Nicotine dependence, unspecified, uncomplicated: Secondary | ICD-10-CM | POA: Insufficient documentation

## 2013-01-16 DIAGNOSIS — Z87440 Personal history of urinary (tract) infections: Secondary | ICD-10-CM | POA: Insufficient documentation

## 2013-01-16 DIAGNOSIS — F411 Generalized anxiety disorder: Secondary | ICD-10-CM | POA: Insufficient documentation

## 2013-01-16 DIAGNOSIS — K029 Dental caries, unspecified: Secondary | ICD-10-CM | POA: Insufficient documentation

## 2013-01-16 DIAGNOSIS — Z79899 Other long term (current) drug therapy: Secondary | ICD-10-CM | POA: Insufficient documentation

## 2013-01-16 MED ORDER — KETOROLAC TROMETHAMINE 60 MG/2ML IM SOLN
60.0000 mg | Freq: Once | INTRAMUSCULAR | Status: AC
  Administered 2013-01-16: 60 mg via INTRAMUSCULAR
  Filled 2013-01-16: qty 2

## 2013-01-16 MED ORDER — OXYCODONE-ACETAMINOPHEN 5-325 MG PO TABS
2.0000 | ORAL_TABLET | Freq: Once | ORAL | Status: AC
  Administered 2013-01-16: 2 via ORAL
  Filled 2013-01-16 (×2): qty 2

## 2013-01-16 MED ORDER — NAPROXEN 500 MG PO TABS: 500.0000 mg | ORAL_TABLET | Freq: Two times a day (BID) | ORAL | Status: DC

## 2013-01-16 MED ORDER — CLINDAMYCIN HCL 150 MG PO CAPS: 300.0000 mg | ORAL_CAPSULE | Freq: Three times a day (TID) | ORAL | Status: DC

## 2013-01-16 MED ORDER — OXYCODONE-ACETAMINOPHEN 5-325 MG PO TABS: 2.0000 | ORAL_TABLET | ORAL | Status: DC | PRN

## 2013-01-16 NOTE — ED Provider Notes (Signed)
History     CSN: [id number]  Arrival date & time 01/16/13  7829   First MD Initiated Contact with Patient 01/16/13 1942      Chief Complaint  Patient presents with  . Dental Pain    (Consider location/radiation/quality/duration/timing/severity/associated sxs/prior treatment) HPI Comments: The patient is a 42 year old otherwise healthy female who presents with dental pain that started gradually ten months ago. The dental pain is severe, constant and progressively worsening. The pain is aching and located in left lower jaw. The pain does not radiate. Eating makes the pain worse. Nothing makes the pain better. The patient has not tried anything for pain. No associated symptoms. Patient denies headache, neck pain/stiffness, fever, NVD, edema, sore throat, throat swelling, wheezing, SOB, chest pain, abdominal pain.     Patient is a 42 y.o. female presenting with tooth pain.  Dental Pain   Past Medical History  Diagnosis Date  . Polysubstance abuse     including narcotics, cocaine, benzodiazepines,  and marijuana  . Psychiatric problem     multiple addmissons for detox  . 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 Chronic low back pain   . GERD (gastroesophageal reflux disease)   . Irritable bowel syndrome (IBS)   . Anxiety   . UTI (lower urinary tract infection)     Past Surgical History  Procedure Laterality Date  . Tubal ligation    . Dilation and curettage of uterus      Family History  Problem Relation Age of Onset  . Coronary artery disease      Female 1st degree relative <60  . Lung cancer    . Stroke      M 1st degree relative <50  . Arthritis Mother     History  Substance Use Topics  . Smoking status: Current Every Day Smoker -- 1.00 packs/day    Types: Cigarettes  . Smokeless tobacco: Never Used  . Alcohol Use: No    OB History   Grav Para Term Preterm Abortions TAB SAB Ect Mult Living   7 5 5  0 2 1 1   5       Review of  Systems  HENT: Positive for dental problem.   All other systems reviewed and are negative.    Allergies  Hydrocodone-acetaminophen; Ibuprofen; and Penicillins  Home Medications   Current Outpatient Rx  Name  Route  Sig  Dispense  Refill  . alprazolam (XANAX) 2 MG tablet   Oral   Take 1 tablet (2 mg total) by mouth 4 (four) times daily as needed for anxiety.   120 tablet   2   . betamethasone dipropionate (DIPROLENE) 0.05 % cream   Topical   Apply topically 2 (two) times daily.   15 g   5   . Cephalexin 500 MG tablet   Oral   Take 1 tablet (500 mg total) by mouth 3 (three) times daily.   30 tablet   0   . FLUoxetine (PROZAC) 20 MG capsule   Oral   Take 1 capsule (20 mg total) by mouth daily.   30 capsule   11   . furosemide (LASIX) 20 MG tablet   Oral   Take 1 tablet (20 mg total) by mouth daily.   30 tablet   5   . oxyCODONE-acetaminophen (PERCOCET) 10-325 MG per tablet   Oral   Take 1 tablet by mouth every 4 (four) hours as needed for pain.  180 tablet   0   . promethazine (PHENERGAN) 12.5 MG tablet      TAKE 1 TABLET BY MOUTH EVERY 4 HOURS AS NEEDED FOR NAUSEA   60 tablet   1   . EXPIRED: promethazine (PHENERGAN) 25 MG tablet   Oral   Take 1 tablet (25 mg total) by mouth every 6 (six) hours as needed for nausea.   60 tablet   2     BP 111/68  Pulse 90  Temp(Src) 98.3 F (36.8 C) (Oral)  Resp 18  Ht 5\' 2"  (1.575 m)  Wt 183 lb (83.008 kg)  BMI 33.46 kg/m2  SpO2 96%  LMP 12/10/2012  Physical Exam  Nursing note and vitals reviewed. Constitutional: She appears well-developed and well-nourished. No distress.  HENT:  Head: Normocephalic and atraumatic.  Poor dentition. Multiple cracked and decayed teeth.   Eyes: Conjunctivae are normal.  Neck: Normal range of motion.  Cardiovascular: Normal rate and regular rhythm.  Exam reveals no gallop and no friction rub.   No murmur heard. Pulmonary/Chest: Effort normal and breath sounds normal. She  has no wheezes. She has no rales. She exhibits no tenderness.  Abdominal: Soft. There is no tenderness.  Musculoskeletal: Normal range of motion.  Neurological: She is alert.  Speech is goal-oriented. Moves limbs without ataxia.   Skin: Skin is warm and dry.  Psychiatric: She has a normal mood and affect. Her behavior is normal.    ED Course  Procedures (including critical care time)  Labs Reviewed - No data to display No results found.   1. Pain, dental       MDM  8:32 PM Patient will have Percocet for pain and Pen VK. Patient instructed to call dentist on attached documents within 48 hours to guarantee a visit. Patient afebrile and vitals stable.         Emilia Beck, PA-C 01/16/13 2204

## 2013-01-16 NOTE — ED Provider Notes (Signed)
History/physical exam/procedure(s) were performed by non-physician practitioner and as supervising physician I was immediately available for consultation/collaboration. I have reviewed all notes and am in agreement with care and plan.   Tachina Spoonemore S Charelle Petrakis, MD 01/16/13 2253 

## 2013-01-16 NOTE — ED Notes (Signed)
Pt c/o broken tooth to the left bottom x couple months. Worse past couple weeks.

## 2013-01-18 ENCOUNTER — Telehealth: Payer: Self-pay | Admitting: Family Medicine

## 2013-01-18 MED ORDER — OXYCODONE-ACETAMINOPHEN 10-325 MG PO TABS: 1.0000 | ORAL_TABLET | ORAL | Status: DC | PRN

## 2013-01-18 NOTE — Telephone Encounter (Signed)
Patient called stating that she would like to pick a refill of percocet 10 mg 1po qid on Monday morning. Please assist.

## 2013-01-18 NOTE — Telephone Encounter (Signed)
done

## 2013-01-18 NOTE — Telephone Encounter (Signed)
Script is ready and pt will pick up on 01/24/13.

## 2013-01-20 ENCOUNTER — Telehealth: Payer: Self-pay | Admitting: Family Medicine

## 2013-01-20 NOTE — Telephone Encounter (Signed)
Pt needs refill of alprazolam (XANAX) 2 MG tablet. Pt said if you could please put w/ her other request.

## 2013-01-21 MED ORDER — ALPRAZOLAM 2 MG PO TABS: 2.0000 mg | ORAL_TABLET | Freq: Four times a day (QID) | ORAL | Status: DC | PRN

## 2013-01-21 NOTE — Telephone Encounter (Signed)
Script is ready for pick up and I spoke with pt.  

## 2013-01-21 NOTE — Telephone Encounter (Signed)
done

## 2013-01-21 NOTE — Telephone Encounter (Signed)
Pt would like to pick up today if possible. Wants to come at 12pm today.

## 2013-02-11 ENCOUNTER — Ambulatory Visit: Payer: No Typology Code available for payment source | Admitting: Family Medicine

## 2013-02-14 ENCOUNTER — Telehealth: Payer: Self-pay | Admitting: Family Medicine

## 2013-02-14 MED ORDER — OXYCODONE-ACETAMINOPHEN 10-325 MG PO TABS
1.0000 | ORAL_TABLET | ORAL | Status: DC | PRN
Start: 2013-02-14 — End: 2013-02-16

## 2013-02-14 NOTE — Telephone Encounter (Signed)
done

## 2013-02-14 NOTE — Telephone Encounter (Signed)
Pt needs new rx percocet. Pt is not due until tomorrow

## 2013-02-14 NOTE — Telephone Encounter (Signed)
Script is ready for pick up, tried to reach pt & no answer. 

## 2013-02-15 ENCOUNTER — Telehealth: Payer: Self-pay | Admitting: Family Medicine

## 2013-02-15 NOTE — Telephone Encounter (Signed)
I spoke with pharmacy and pt cannot get this filled until 3 days before due date. Pt got last filled on 01/22/13 and I spoke with pt also.

## 2013-02-15 NOTE — Telephone Encounter (Signed)
Pt states she had her RX 4 days last month before she filled it. Now pharm does not want to fill it because it will be early. Pt wants to get it filled due to the snow. Pharm states they need MD to call and say it is ok to fill.  Pharm number: [phone] (amy is pharmacist) Pt is on her way to pick up script and would then like to go get it filled. Pls advise.

## 2013-02-16 ENCOUNTER — Emergency Department (HOSPITAL_COMMUNITY)
Admission: EM | Admit: 2013-02-16 | Discharge: 2013-02-16 | Disposition: A | Discharge status: Home / Self Care | Payer: No Typology Code available for payment source | Attending: Emergency Medicine | Admitting: Emergency Medicine

## 2013-02-16 ENCOUNTER — Telehealth: Payer: Self-pay

## 2013-02-16 DIAGNOSIS — K0889 Other specified disorders of teeth and supporting structures: Secondary | ICD-10-CM

## 2013-02-16 DIAGNOSIS — Z79899 Other long term (current) drug therapy: Secondary | ICD-10-CM | POA: Insufficient documentation

## 2013-02-16 DIAGNOSIS — F411 Generalized anxiety disorder: Secondary | ICD-10-CM | POA: Insufficient documentation

## 2013-02-16 DIAGNOSIS — F17200 Nicotine dependence, unspecified, uncomplicated: Secondary | ICD-10-CM | POA: Insufficient documentation

## 2013-02-16 DIAGNOSIS — M545 Low back pain, unspecified: Secondary | ICD-10-CM | POA: Insufficient documentation

## 2013-02-16 DIAGNOSIS — K029 Dental caries, unspecified: Secondary | ICD-10-CM | POA: Insufficient documentation

## 2013-02-16 DIAGNOSIS — Z87440 Personal history of urinary (tract) infections: Secondary | ICD-10-CM | POA: Insufficient documentation

## 2013-02-16 DIAGNOSIS — Z8719 Personal history of other diseases of the digestive system: Secondary | ICD-10-CM | POA: Insufficient documentation

## 2013-02-16 DIAGNOSIS — Z8659 Personal history of other mental and behavioral disorders: Secondary | ICD-10-CM | POA: Insufficient documentation

## 2013-02-16 DIAGNOSIS — K089 Disorder of teeth and supporting structures, unspecified: Secondary | ICD-10-CM | POA: Insufficient documentation

## 2013-02-16 DIAGNOSIS — G8929 Other chronic pain: Secondary | ICD-10-CM | POA: Insufficient documentation

## 2013-02-16 MED ORDER — OXYCODONE-ACETAMINOPHEN 5-325 MG PO TABS
1.0000 | ORAL_TABLET | Freq: Once | ORAL | Status: AC
  Administered 2013-02-16: 1 via ORAL
  Filled 2013-02-16: qty 1

## 2013-02-16 MED ORDER — BUPIVACAINE-EPINEPHRINE PF 0.5-1:200000 % IJ SOLN
1.8000 mL | Freq: Once | INTRAMUSCULAR | Status: AC
  Administered 2013-02-16: 9 mg
  Filled 2013-02-16: qty 1.8

## 2013-02-16 MED ORDER — OXYCODONE-ACETAMINOPHEN 5-325 MG PO TABS: ORAL_TABLET | ORAL | Status: DC

## 2013-02-16 NOTE — Telephone Encounter (Signed)
Pt would like referral to [facility] to get her tooth pulled out. [phone]. Pt stating she needs the pain meds for her tooth.

## 2013-02-16 NOTE — Telephone Encounter (Signed)
Pt states the dentist continues to tell pt that she needs a referral for the dentist. Pt states her orange card is requiring this. Called and spoke with pt and pt is aware of Dr. Claris Che recommendations.  Pt states her husband has broken his foot and she is going to take him to urgent care and she may see if they will refer her to the dentist.

## 2013-02-16 NOTE — Telephone Encounter (Signed)
You do not need a referral to see a dentist. NO early refills

## 2013-02-16 NOTE — Telephone Encounter (Signed)
pls advise

## 2013-02-16 NOTE — ED Notes (Signed)
Pt states she has a tooth that is broken on L lower side. Hx of tooth problems. OTC meds not helping.

## 2013-02-16 NOTE — ED Provider Notes (Signed)
Medical screening examination/treatment/procedure(s) were performed by non-physician practitioner and as supervising physician I was immediately available for consultation/collaboration.  [name], MD 02/16/13 2202

## 2013-02-16 NOTE — Telephone Encounter (Signed)
Pt called and states that her niece went to [place] on her plane ticket and pt's sister got her a plane ticket for tomorrow night. Pt would like to know if medication can be called in early.  Advised pt that the answer was still no.  Pt instisted on speaking with Dr. Clent Ridges advised that Dr. Clent Ridges was currently seeing patients.

## 2013-02-16 NOTE — ED Provider Notes (Signed)
History    This chart was scribed for non-physician practitioner working with Lynn Chick, MD by Sofie Rower, ED Scribe. This patient was seen in room WTR8/WTR8 and the patient's care was started at 7:22PM.   CSN: [id number]  Arrival date & time 02/16/13  7829   First MD Initiated Contact with Patient 02/16/13 1922      Chief Complaint  Patient presents with  . Dental Pain    (Consider location/radiation/quality/duration/timing/severity/associated sxs/prior treatment) The history is provided by the patient. No language interpreter was used.    Lynn Wallace is a 42 y.o. female , with a hx of polysubstance abuse and anxiety, who presents to the Emergency Department complaining of dental pain located at the left lower jaw, radiating upwards towards the left side of the face and left ear, onset two months ago.  Associated symptoms include loss of sleep. The pt reports she he been experiencing pain from a broken lower left molar for the past two months. Furthermore, the pt informs she has run out of her regularly scheduled pain medication at the present point and time, which has prompted her concern and desire to seek medical evaluation at [facility] this evening. The pt has taken tylenol and motrin, however, neither of which provide relief of her dental pain. Modifying factors include certain movements and positions of the left jaw which intensifies the dental pain.  The pt is a current everyday smoker (1.0 packs/day), however, she does not drink alcohol.   PCP is Dr. Clent Ridges.     Past Medical History  Diagnosis Date  . Polysubstance abuse     including narcotics, cocaine, benzodiazepines,  and marijuana  . Psychiatric problem     multiple addmissons for detox  . 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 Chronic low back pain   . GERD (gastroesophageal reflux disease)   . Irritable bowel syndrome (IBS)   . Anxiety   . UTI (lower urinary tract infection)      Past Surgical History  Procedure Laterality Date  . Tubal ligation    . Dilation and curettage of uterus      Family History  Problem Relation Age of Onset  . Coronary artery disease      Female 1st degree relative <60  . Lung cancer    . Stroke      M 1st degree relative <50  . Arthritis Mother     History  Substance Use Topics  . Smoking status: Current Every Day Smoker -- 1.00 packs/day    Types: Cigarettes  . Smokeless tobacco: Never Used  . Alcohol Use: No    OB History   Grav Para Term Preterm Abortions TAB SAB Ect Mult Living   7 5 5  0 2 1 1   5       Review of Systems  Constitutional: Negative for fever.  HENT: Positive for dental problem.   Respiratory: Negative for shortness of breath.   Cardiovascular: Negative for chest pain.  Gastrointestinal: Negative for nausea, vomiting, abdominal pain and diarrhea.  All other systems reviewed and are negative.    Allergies  Hydrocodone-acetaminophen; Ibuprofen; Penicillins; Cleocin; and Keflex  Home Medications   Current Outpatient Rx  Name  Route  Sig  Dispense  Refill  . alprazolam (XANAX) 2 MG tablet   Oral   Take 1 tablet (2 mg total) by mouth 4 (four) times daily as needed for anxiety.   120 tablet  2   . FLUoxetine (PROZAC) 20 MG capsule   Oral   Take 20 mg by mouth daily.         . furosemide (LASIX) 20 MG tablet   Oral   Take 1 tablet (20 mg total) by mouth daily.   30 tablet   5   . EXPIRED: promethazine (PHENERGAN) 25 MG tablet   Oral   Take 1 tablet (25 mg total) by mouth every 6 (six) hours as needed for nausea.   60 tablet   2     BP 109/81  Pulse 68  Temp(Src) 97.6 F (36.4 C) (Oral)  SpO2 100%  Physical Exam  Nursing note and vitals reviewed. Constitutional: She is oriented to person, place, and time. She appears well-developed and well-nourished. No distress.  HENT:  Head: Normocephalic.  Mouth/Throat: Dental caries present.  Poor dentition. Multiple dental  caries detected upon exam. No swelling. No erythema.   Eyes: Conjunctivae and EOM are normal.  Cardiovascular: Normal rate.   Pulmonary/Chest: Effort normal. No stridor.  Musculoskeletal: Normal range of motion.  Neurological: She is alert and oriented to person, place, and time.  Psychiatric: She has a normal mood and affect.    ED Course  NERVE BLOCK Date/Time: 02/16/2013 9:49 PM Performed by: Wynetta Emery Authorized by: Wynetta Emery Consent: Verbal consent obtained. Risks and benefits: risks, benefits and alternatives were discussed Consent given by: patient Patient understanding: patient states understanding of the procedure being performed Patient identity confirmed: verbally with patient and arm band Indications: pain relief Body area: face/mouth Laterality: left Patient sedated: no Patient position: sitting Needle gauge: 22 G Location technique: anatomical landmarks Local anesthetic: bupivacaine 0.5% with epinephrine Anesthetic total: 1.8 ml Outcome: pain unchanged Patient tolerance: Patient tolerated the procedure well with no immediate complications.   (including critical care time)  DIAGNOSTIC STUDIES: Oxygen Saturation is 100% on room air, normal by my interpretation.    COORDINATION OF CARE:  7:25 PM- Treatment plan concerning dental block procedure discussed with patient. Pt agrees with treatment.  7:40 PM- Recheck. Dental block performed. Treatment plan discussed with patient. Pt agrees with treatment.         Labs Reviewed - No data to display No results found.   1. Pain, dental       MDM   Uncomplicated dental pain  Filed Vitals:   02/16/13 1914  BP: 109/81  Pulse: 68  Temp: 97.6 F (36.4 C)  TempSrc: Oral  SpO2: 100%     Pt verbalized understanding and agrees with care plan. Outpatient follow-up and return precautions given.    Discharge Medication List as of 02/16/2013  7:45 PM    START taking these medications    Details  oxyCODONE-acetaminophen (PERCOCET/ROXICET) 5-325 MG per tablet 1 to 2 tabs PO q6hrs  PRN for pain, Print        I personally performed the services described in this documentation, which was scribed in my presence. The recorded information has been reviewed and is accurate.   Wynetta Emery, PA-C 02/16/13 2150

## 2013-02-16 NOTE — Telephone Encounter (Signed)
Left a message for pt to return call 

## 2013-02-18 ENCOUNTER — Ambulatory Visit: Payer: No Typology Code available for payment source | Admitting: Family Medicine

## 2013-03-01 ENCOUNTER — Ambulatory Visit (INDEPENDENT_AMBULATORY_CARE_PROVIDER_SITE_OTHER): Payer: No Typology Code available for payment source | Admitting: Family Medicine

## 2013-03-01 ENCOUNTER — Encounter: Payer: Self-pay | Admitting: Family Medicine

## 2013-03-01 VITALS — BP 100/74 | HR 97 | Temp 98.1°F | Wt 190.0 lb

## 2013-03-01 DIAGNOSIS — J209 Acute bronchitis, unspecified: Secondary | ICD-10-CM

## 2013-03-01 MED ORDER — DOXYCYCLINE HYCLATE 100 MG PO CAPS: 100.0000 mg | ORAL_CAPSULE | Freq: Two times a day (BID) | ORAL | Status: AC

## 2013-03-01 MED ORDER — HYDROCODONE-HOMATROPINE 5-1.5 MG/5ML PO SYRP: 5.0000 mL | ORAL_SOLUTION | ORAL | Status: DC | PRN

## 2013-03-01 NOTE — Progress Notes (Signed)
  Subjective:    Patient ID: Lynn Wallace, female    DOB: [birth year], 42 y.o.   MRN: [id number]  HPI Here for one week of chest congestion and coughing up green sputum. No fever.    Review of Systems  Constitutional: Negative.   HENT: Negative.   Eyes: Negative.   Respiratory: Positive for cough and chest tightness.        Objective:   Physical Exam  Constitutional: She appears well-developed and well-nourished.  HENT:  Right Ear: External ear normal.  Left Ear: External ear normal.  Nose: Nose normal.  Mouth/Throat: Oropharynx is clear and moist.  Eyes: Conjunctivae are normal.  Neck: No thyromegaly present.  Pulmonary/Chest: Effort normal. No respiratory distress. She has no wheezes. She has no rales.  Rhonchi   Lymphadenopathy:    She has no cervical adenopathy.          Assessment & Plan:  Add Mucinex.

## 2013-03-08 ENCOUNTER — Telehealth: Payer: Self-pay | Admitting: Family Medicine

## 2013-03-08 NOTE — Telephone Encounter (Signed)
Patient Information:  Caller Name: Colandra  Phone: [phone]  Patient: Lynn Wallace, Lynn Wallace  Gender: Female  DOB: 1971/05/22  Age: 42 Years  PCP: Gershon Crane [facility])  Pregnant: No  Office Follow Up:  Does the office need to follow up with this patient?: No  Instructions For The Office: N/A   Symptoms  Reason For Call & Symptoms: Patient calling about chronic back pain that goes into hips, right knee pain. Relates she sometimes "doubles up" on medications.  Caller requests Rx for 10-15 "Roxy tablets" or Hydrocodone 10 mg tablets.  States, "I can deal with the itching".  Pain rated as "worser than a 10".  Referred to Urgent Care as it is after 1600 and no appointments available today.  Reviewed Health History In EMR: Yes  Reviewed Medications In EMR: Yes  Reviewed Allergies In EMR: Yes  Reviewed Surgeries / Procedures: Yes  Date of Onset of Symptoms: Unknown  Treatments Tried: Percocet, Motrin, Ibuprofen, Tylenol, Arthritis Pain Relievers  Treatments Tried Worked: No OB / GYN:  LMP: 03/01/2013  Guideline(s) Used:  Back Pain  Disposition Per Guideline:   Go to Office Now  Reason For Disposition Reached:   Severe back pain  Advice Given:  N/A  RN Overrode Recommendation:  Document Patient  Advised to go to Urgent Care for her severe back pain since no appointments available this date.   She insists on appointment at this office and states intent to call scheduler to make same.

## 2013-03-09 ENCOUNTER — Encounter: Payer: No Typology Code available for payment source | Admitting: Family Medicine

## 2013-03-09 ENCOUNTER — Telehealth: Payer: Self-pay

## 2013-03-09 NOTE — Telephone Encounter (Signed)
Per Dr. Elmyra Ricks request called pt to make aware that if her pain was that severe she needed to go to ED and follow up with Dr. Clent Ridges next week.  Called and left a detailed message for pt.

## 2013-03-09 NOTE — Progress Notes (Signed)
This encounter was created in error - please disregard.

## 2013-03-10 ENCOUNTER — Emergency Department (HOSPITAL_BASED_OUTPATIENT_CLINIC_OR_DEPARTMENT_OTHER)
Admission: EM | Admit: 2013-03-10 | Discharge: 2013-03-10 | Discharge status: Against Medical Advice | Payer: No Typology Code available for payment source | Attending: Emergency Medicine | Admitting: Emergency Medicine

## 2013-03-10 ENCOUNTER — Encounter (HOSPITAL_BASED_OUTPATIENT_CLINIC_OR_DEPARTMENT_OTHER): Payer: Self-pay

## 2013-03-10 DIAGNOSIS — F17200 Nicotine dependence, unspecified, uncomplicated: Secondary | ICD-10-CM | POA: Insufficient documentation

## 2013-03-10 DIAGNOSIS — R51 Headache: Secondary | ICD-10-CM | POA: Insufficient documentation

## 2013-03-10 NOTE — ED Notes (Signed)
Pt texting throughout triage-NAD noted-steady gait and texting en route to tx area

## 2013-03-10 NOTE — ED Notes (Signed)
Registration reports pt seen leaving lobby and walked out of the facility

## 2013-03-10 NOTE — ED Notes (Signed)
C/o Ha x 3 days-chronic pain to back and legs

## 2013-03-10 NOTE — ED Notes (Signed)
Pt seen leaving room headed to lobby

## 2013-03-16 ENCOUNTER — Telehealth: Payer: Self-pay | Admitting: Family Medicine

## 2013-03-16 MED ORDER — OXYCODONE-ACETAMINOPHEN 5-325 MG PO TABS: 1.0000 | ORAL_TABLET | Freq: Four times a day (QID) | ORAL | Status: DC | PRN

## 2013-03-16 NOTE — Telephone Encounter (Signed)
done

## 2013-03-16 NOTE — Telephone Encounter (Signed)
Pt needs new rx percocet. Pt is aware rx due on monday

## 2013-03-18 ENCOUNTER — Telehealth: Payer: Self-pay | Admitting: Family Medicine

## 2013-03-18 MED ORDER — OXYCODONE-ACETAMINOPHEN 10-325 MG PO TABS: 1.0000 | ORAL_TABLET | ORAL | Status: DC | PRN

## 2013-03-18 NOTE — Telephone Encounter (Signed)
She is correct. Have her bring the rx we recently gave her back so we can destroy it. I wrote a new one for 10/325 #180.

## 2013-03-18 NOTE — Telephone Encounter (Signed)
Pt usually gets Percocet 10/325 mg #180, she just picked up script and it is not this dose. Can we print a new script and she will bring old one back in?

## 2013-03-18 NOTE — Telephone Encounter (Signed)
I spoke with pt  

## 2013-04-05 ENCOUNTER — Emergency Department (HOSPITAL_COMMUNITY)
Admission: EM | Admit: 2013-04-05 | Discharge: 2013-04-05 | Disposition: A | Discharge status: Home / Self Care | Payer: No Typology Code available for payment source | Attending: Emergency Medicine | Admitting: Emergency Medicine

## 2013-04-05 DIAGNOSIS — Z880 Allergy status to penicillin: Secondary | ICD-10-CM | POA: Insufficient documentation

## 2013-04-05 DIAGNOSIS — Z87440 Personal history of urinary (tract) infections: Secondary | ICD-10-CM | POA: Insufficient documentation

## 2013-04-05 DIAGNOSIS — F411 Generalized anxiety disorder: Secondary | ICD-10-CM | POA: Insufficient documentation

## 2013-04-05 DIAGNOSIS — K0889 Other specified disorders of teeth and supporting structures: Secondary | ICD-10-CM

## 2013-04-05 DIAGNOSIS — F17200 Nicotine dependence, unspecified, uncomplicated: Secondary | ICD-10-CM | POA: Insufficient documentation

## 2013-04-05 DIAGNOSIS — G8929 Other chronic pain: Secondary | ICD-10-CM | POA: Insufficient documentation

## 2013-04-05 DIAGNOSIS — F319 Bipolar disorder, unspecified: Secondary | ICD-10-CM | POA: Insufficient documentation

## 2013-04-05 DIAGNOSIS — Z79899 Other long term (current) drug therapy: Secondary | ICD-10-CM | POA: Insufficient documentation

## 2013-04-05 DIAGNOSIS — Z8739 Personal history of other diseases of the musculoskeletal system and connective tissue: Secondary | ICD-10-CM | POA: Insufficient documentation

## 2013-04-05 DIAGNOSIS — K029 Dental caries, unspecified: Secondary | ICD-10-CM | POA: Insufficient documentation

## 2013-04-05 DIAGNOSIS — K089 Disorder of teeth and supporting structures, unspecified: Secondary | ICD-10-CM | POA: Insufficient documentation

## 2013-04-05 DIAGNOSIS — Z8719 Personal history of other diseases of the digestive system: Secondary | ICD-10-CM | POA: Insufficient documentation

## 2013-04-05 MED ORDER — LIDOCAINE-EPINEPHRINE-TETRACAINE (LET) SOLUTION: 3.0000 mL | Freq: Once | NASAL | Status: DC

## 2013-04-05 MED ORDER — BUPIVACAINE HCL 0.25 % IJ SOLN
5.0000 mL | Freq: Once | INTRAMUSCULAR | Status: DC
  Filled 2013-04-05: qty 5

## 2013-04-05 MED ORDER — LIDOCAINE-EPINEPHRINE-TETRACAINE (LET) TOPICAL GEL
3.0000 mL | Freq: Once | TOPICAL | Status: DC
  Filled 2013-04-05: qty 3

## 2013-04-05 NOTE — ED Notes (Signed)
Pt states stormed out and stated "I don't have time to wait on slow people" after waiting 30 minutes.

## 2013-04-05 NOTE — ED Provider Notes (Signed)
History    This chart was scribed for non-physician practitioner Johnnette Gourd working with Celene Kras, MD by Quintella Reichert, ED Scribe. This patient was seen in room WTR5/WTR5 and the patient's care was started at 6:00 PM .   CSN: [id number]  Arrival date & time 04/05/13  1745      Chief Complaint  Patient presents with  . Dental Pain     The history is provided by the patient. No language interpreter was used.   HPI Comments: Lynn Wallace is a 42 y.o. female who presents to the Emergency Department complaining of intermittent left-sided lower dental pain that began several months ago but became much more severe 2 days ago.  She denies recent injury that may have caused exacerbation.  Pt rates severity of pain as 12/10, and states it radiates to the lower-left wisdom tooth excision area and her left ear.  It is exacerbated by eating on that side, cold air and swallowing.  She states she took 10-325 mg percocet, which she was prescribed for her chronic back pain by her PCP over the past 3 to 4 years, with no improvement in the pain.  She denies fever, chills, difficulty swallowing or any other associated symptoms..  Pt states she came to ED on 03/18/13 for dental pain and was referred to a dentist.  She states she attempted to f/u but was unable to secure an appointment for 2 more months.  Pt states that she was given a dental block for similar symptoms at a prior ED visit, which failed to relieve pain.  Past Medical History  Diagnosis Date  . Polysubstance abuse     including narcotics, cocaine, benzodiazepines,  and marijuana  . Psychiatric problem     multiple addmissons for detox  . 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 Chronic low back pain   . GERD (gastroesophageal reflux disease)   . Irritable bowel syndrome (IBS)   . Anxiety   . UTI (lower urinary tract infection)     Past Surgical History  Procedure Laterality Date  . Tubal ligation     . Dilation and curettage of uterus      Family History  Problem Relation Age of Onset  . Coronary artery disease      Female 1st degree relative <60  . Lung cancer    . Stroke      M 1st degree relative <50  . Arthritis Mother     History  Substance Use Topics  . Smoking status: Current Every Day Smoker -- 1.00 packs/day    Types: Cigarettes  . Smokeless tobacco: Never Used  . Alcohol Use: No    OB History   Grav Para Term Preterm Abortions TAB SAB Ect Mult Living   7 5 5  0 2 1 1   5       Review of Systems  Constitutional: Negative for fever and chills.  HENT: Dental problem: pain.        Dental pain  Gastrointestinal: Negative for nausea, vomiting and diarrhea.  All other systems reviewed and are negative.    Allergies  Hydrocodone-acetaminophen; Ibuprofen; Penicillins; Cleocin; and Keflex  Home Medications   Current Outpatient Rx  Name  Route  Sig  Dispense  Refill  . alprazolam (XANAX) 2 MG tablet   Oral   Take 1 tablet (2 mg total) by mouth 4 (four) times daily as needed for anxiety.   120 tablet  2   . FLUoxetine (PROZAC) 20 MG capsule   Oral   Take 20 mg by mouth daily.         . furosemide (LASIX) 20 MG tablet   Oral   Take 1 tablet (20 mg total) by mouth daily.   30 tablet   5   . HYDROcodone-homatropine (HYDROMET) 5-1.5 MG/5ML syrup   Oral   Take 5 mLs by mouth every 4 (four) hours as needed for cough.   240 mL   0   . oxyCODONE-acetaminophen (PERCOCET) 10-325 MG per tablet   Oral   Take 1 tablet by mouth every 4 (four) hours as needed for pain.   180 tablet   0     BP 107/73  Pulse 90  Temp(Src) 98.3 F (36.8 C) (Oral)  SpO2 96%  LMP 02/22/2013  Physical Exam  Nursing note and vitals reviewed. Constitutional: She is oriented to person, place, and time. She appears well-developed and well-nourished. No distress.  HENT:  Head: Normocephalic and atraumatic.  Mouth/Throat: Oropharynx is clear and moist.  Left lower  posterior molar: Tooth decay and dental caries with tenderness.  No surrounding erythema, edema or abscess. No adenopathy.  Eyes: Conjunctivae and EOM are normal.  Neck: Normal range of motion. Neck supple.  Cardiovascular: Normal rate.   Pulmonary/Chest: Effort normal.  Musculoskeletal: Normal range of motion. She exhibits no edema.  Lymphadenopathy:    She has no cervical adenopathy.  Neurological: She is alert and oriented to person, place, and time. No sensory deficit.  Skin: Skin is warm and dry.  Psychiatric: She has a normal mood and affect. Her behavior is normal.    ED Course  Procedures (including critical care time)  DIAGNOSTIC STUDIES: Oxygen Saturation is 96% on room air, normal by my interpretation.    COORDINATION OF CARE: 6:05 PM-Explained that no narcotic prescriptions will be given in ED because pt has not run out of her currently prescribed percocet.  Pt requested a prescription for lidocaine.  Pt verbalized understanding that no pain medicine prescriptions would be given in ED.  Discussed treatment plan which includes dental block with pt at bedside and pt agreed to plan.  6:25 PM: Nurse informed me that pt left, stating that a 30-minute wait is too long.    Labs Reviewed - No data to display No results found.   1. Pain, dental       MDM  42 year old female dental pain. No evidence of surrounding infection or abscess. She is requesting a stronger pain medication than Percocet, however I told patient she has Percocet at home and I could not prescribe her any additional medication. Dental block offered the patient who was agreeable initially, however she then left the emergency department.     I personally performed the services described in this documentation, which was scribed in my presence. The recorded information has been reviewed and is accurate.   Trevor Mace, PA-C 04/05/13 [phone]

## 2013-04-05 NOTE — ED Notes (Signed)
Pt states her dental pain that she has had in past has come back on R lower side. Pt has had pain x 3-4 days

## 2013-04-06 ENCOUNTER — Encounter: Payer: Self-pay | Admitting: Family Medicine

## 2013-04-06 ENCOUNTER — Ambulatory Visit (INDEPENDENT_AMBULATORY_CARE_PROVIDER_SITE_OTHER): Payer: No Typology Code available for payment source | Admitting: Family Medicine

## 2013-04-06 VITALS — BP 100/64 | HR 97 | Temp 97.9°F | Wt 188.0 lb

## 2013-04-06 DIAGNOSIS — M545 Low back pain: Secondary | ICD-10-CM

## 2013-04-06 MED ORDER — KETOROLAC TROMETHAMINE 60 MG/2ML IM SOLN
60.0000 mg | Freq: Once | INTRAMUSCULAR | Status: AC
  Administered 2013-04-06: 60 mg via INTRAMUSCULAR

## 2013-04-06 MED ORDER — HYDROCODONE-ACETAMINOPHEN 10-325 MG PO TABS: 1.0000 | ORAL_TABLET | Freq: Four times a day (QID) | ORAL | Status: AC | PRN

## 2013-04-06 NOTE — Progress Notes (Signed)
  Subjective:    Patient ID: Lynn Wallace, female    DOB: 09-17-1971, 42 y.o.   MRN: [id number]  HPI Here for worsening low back pain. She is already taking Percocet but says that her pain is not relieved. She is applying for Medicaid, and until she gets this she cannot afford to see any other specialists about her back.    Review of Systems  Constitutional: Negative.   Musculoskeletal: Positive for back pain.       Objective:   Physical Exam  Constitutional: She appears well-developed and well-nourished.          Assessment & Plan:  Given a Toradol shot and a small supply of Vicodin. Hopefully her Medicaid will come through soon.

## 2013-04-06 NOTE — Addendum Note (Signed)
Addended by: Aniceto Boss A on: 04/06/2013 12:03 PM   Modules accepted: Orders

## 2013-04-06 NOTE — ED Provider Notes (Signed)
Medical screening examination/treatment/procedure(s) were performed by non-physician practitioner and as supervising physician I was immediately available for consultation/collaboration.   Ismael Karge R Janera Peugh, MD 04/06/13 0016 

## 2013-04-12 ENCOUNTER — Telehealth: Payer: Self-pay | Admitting: Family Medicine

## 2013-04-12 MED ORDER — OXYCODONE-ACETAMINOPHEN 10-325 MG PO TABS: 1.0000 | ORAL_TABLET | ORAL | Status: DC | PRN

## 2013-04-12 NOTE — Telephone Encounter (Signed)
Pt needs rx for percocet. Pt is due friday

## 2013-04-12 NOTE — Telephone Encounter (Signed)
Script is ready and pt will pick up on Friday.

## 2013-04-12 NOTE — Telephone Encounter (Signed)
done

## 2013-05-03 ENCOUNTER — Telehealth: Payer: Self-pay | Admitting: Family Medicine

## 2013-05-03 NOTE — Telephone Encounter (Addendum)
Pt needs refill of alprazolam (XANAX) 2 MG tablet.  Pt asked if you could print out RX because she is not sure where she will get filled. Pt would like a call when ready.

## 2013-05-04 MED ORDER — ALPRAZOLAM 2 MG PO TABS: 2.0000 mg | ORAL_TABLET | Freq: Four times a day (QID) | ORAL | Status: DC | PRN

## 2013-05-04 NOTE — Telephone Encounter (Signed)
done

## 2013-05-04 NOTE — Telephone Encounter (Signed)
Script is ready and I spoke with pt. 

## 2013-05-09 ENCOUNTER — Telehealth: Payer: Self-pay | Admitting: Family Medicine

## 2013-05-09 ENCOUNTER — Ambulatory Visit: Payer: Self-pay | Admitting: Family Medicine

## 2013-05-09 DIAGNOSIS — Z0289 Encounter for other administrative examinations: Secondary | ICD-10-CM

## 2013-05-09 NOTE — Telephone Encounter (Signed)
We will write this on Thursday

## 2013-05-09 NOTE — Telephone Encounter (Signed)
PT called to request a refill of her oxyCODONE-acetaminophen (PERCOCET) 10-325 MG per tablet. She stated that she is aware that it is not due until Friday, but wanted to put the request in 3 days early. Please assist.

## 2013-05-13 ENCOUNTER — Telehealth: Payer: Self-pay | Admitting: Family Medicine

## 2013-05-13 MED ORDER — OXYCODONE-ACETAMINOPHEN 10-325 MG PO TABS: 1.0000 | ORAL_TABLET | ORAL | Status: DC | PRN

## 2013-05-13 NOTE — Telephone Encounter (Signed)
done

## 2013-05-13 NOTE — Telephone Encounter (Signed)
Script is ready and I left voice message for pt.

## 2013-05-16 ENCOUNTER — Telehealth: Payer: Self-pay | Admitting: Family Medicine

## 2013-05-16 MED ORDER — FUROSEMIDE 20 MG PO TABS: 20.0000 mg | ORAL_TABLET | Freq: Every day | ORAL | Status: DC

## 2013-05-16 NOTE — Telephone Encounter (Signed)
Having some foot swelling

## 2013-05-23 ENCOUNTER — Emergency Department (HOSPITAL_COMMUNITY)
Admission: EM | Admit: 2013-05-23 | Discharge: 2013-05-23 | Disposition: A | Discharge status: Home / Self Care | Payer: Self-pay | Attending: Emergency Medicine | Admitting: Emergency Medicine

## 2013-05-23 ENCOUNTER — Encounter (HOSPITAL_COMMUNITY): Payer: Self-pay | Admitting: Emergency Medicine

## 2013-05-23 ENCOUNTER — Telehealth: Payer: Self-pay

## 2013-05-23 ENCOUNTER — Ambulatory Visit: Payer: Self-pay | Admitting: Family Medicine

## 2013-05-23 DIAGNOSIS — K589 Irritable bowel syndrome without diarrhea: Secondary | ICD-10-CM | POA: Insufficient documentation

## 2013-05-23 DIAGNOSIS — K219 Gastro-esophageal reflux disease without esophagitis: Secondary | ICD-10-CM | POA: Insufficient documentation

## 2013-05-23 DIAGNOSIS — Z87440 Personal history of urinary (tract) infections: Secondary | ICD-10-CM | POA: Insufficient documentation

## 2013-05-23 DIAGNOSIS — R109 Unspecified abdominal pain: Secondary | ICD-10-CM | POA: Insufficient documentation

## 2013-05-23 DIAGNOSIS — R609 Edema, unspecified: Secondary | ICD-10-CM | POA: Insufficient documentation

## 2013-05-23 DIAGNOSIS — Z79899 Other long term (current) drug therapy: Secondary | ICD-10-CM | POA: Insufficient documentation

## 2013-05-23 DIAGNOSIS — Z8659 Personal history of other mental and behavioral disorders: Secondary | ICD-10-CM | POA: Insufficient documentation

## 2013-05-23 DIAGNOSIS — F17200 Nicotine dependence, unspecified, uncomplicated: Secondary | ICD-10-CM | POA: Insufficient documentation

## 2013-05-23 DIAGNOSIS — F1910 Other psychoactive substance abuse, uncomplicated: Secondary | ICD-10-CM | POA: Insufficient documentation

## 2013-05-23 DIAGNOSIS — G8929 Other chronic pain: Secondary | ICD-10-CM | POA: Insufficient documentation

## 2013-05-23 DIAGNOSIS — F319 Bipolar disorder, unspecified: Secondary | ICD-10-CM | POA: Insufficient documentation

## 2013-05-23 LAB — CBC WITH DIFFERENTIAL/PLATELET
Basophils Absolute: 0 10*3/uL (ref 0.0–0.1)
Basophils Relative: 0 % (ref 0–1)
Eosinophils Absolute: 0.1 10*3/uL (ref 0.0–0.7)
Hemoglobin: 12.6 g/dL (ref 12.0–15.0)
MCH: 33 pg (ref 26.0–34.0)
MCHC: 32.9 g/dL (ref 30.0–36.0)
Monocytes Relative: 10 % (ref 3–12)
Neutro Abs: 2.8 10*3/uL (ref 1.7–7.7)
Neutrophils Relative %: 49 % (ref 43–77)
Platelets: 195 10*3/uL (ref 150–400)

## 2013-05-23 LAB — POCT I-STAT, CHEM 8
BUN: 8 mg/dL (ref 6–23)
Calcium, Ion: 1.14 mmol/L (ref 1.12–1.23)
Chloride: 101 mEq/L (ref 96–112)
Creatinine, Ser: 0.7 mg/dL (ref 0.50–1.10)
Glucose, Bld: 78 mg/dL (ref 70–99)
HCT: 40 % (ref 36.0–46.0)
Hemoglobin: 13.6 g/dL (ref 12.0–15.0)
Potassium: 3.5 mEq/L (ref 3.5–5.1)
Sodium: 140 mEq/L (ref 135–145)
TCO2: 27 mmol/L (ref 0–100)

## 2013-05-23 MED ORDER — OXYCODONE-ACETAMINOPHEN 5-325 MG PO TABS
1.0000 | ORAL_TABLET | Freq: Once | ORAL | Status: AC
  Administered 2013-05-23: 1 via ORAL
  Filled 2013-05-23: qty 1

## 2013-05-23 MED ORDER — FUROSEMIDE 10 MG/ML IJ SOLN
40.0000 mg | Freq: Once | INTRAMUSCULAR | Status: AC
  Administered 2013-05-23: 40 mg via INTRAMUSCULAR
  Filled 2013-05-23: qty 4

## 2013-05-23 NOTE — ED Provider Notes (Signed)
History    CSN: [id number] Arrival date & time 05/23/13  1652  First MD Initiated Contact with Patient 05/23/13 1839     Chief Complaint  Patient presents with  . Leg Swelling  . Facial Swelling  . Arm Swelling   (Consider location/radiation/quality/duration/timing/severity/associated sxs/prior Treatment) HPI Lynn Wallace is a 42 y.o. female who presents to ED with multiple complaints including back pain, abdominal pain, swelling in legs, face, fingers. States back pain is chronic but worsening. States Treated by Dr. Clent Ridges with percocet 10s but did not take it this evening "because I was going to come to the hospital anyways." States abdominal pain is chronic as well, for "months." States main reason she came in is welling. State has had swelling for months as well, states was seen by Dr. Clent Ridges, started on lasix. States taking 20mg  daily, swelling worsened in last several days. Denies pain in calves. States "feet hurt." denies recent travel or surgeries.    Past Medical History  Diagnosis Date  . Polysubstance abuse     including narcotics, cocaine, benzodiazepines,  and marijuana  . Psychiatric problem     multiple addmissons for detox  . 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 Chronic low back pain   . GERD (gastroesophageal reflux disease)   . Irritable bowel syndrome (IBS)   . Anxiety   . UTI (lower urinary tract infection)    Past Surgical History  Procedure Laterality Date  . Tubal ligation    . Dilation and curettage of uterus     Family History  Problem Relation Age of Onset  . Coronary artery disease      Female 1st degree relative <60  . Lung cancer    . Stroke      M 1st degree relative <50  . Arthritis Mother    History  Substance Use Topics  . Smoking status: Current Every Day Smoker    Types: Cigarettes  . Smokeless tobacco: Never Used     Comment: 1/2 pack or less  . Alcohol Use: No   OB History   Grav Para Term  Preterm Abortions TAB SAB Ect Mult Living   7 5 5  0 2 1 1   5      Review of Systems  Constitutional: Negative for fever and chills.  HENT: Negative for neck pain and neck stiffness.   Respiratory: Negative.   Cardiovascular: Positive for leg swelling. Negative for chest pain and palpitations.  Gastrointestinal: Positive for abdominal pain. Negative for nausea and vomiting.  Genitourinary: Negative.   Musculoskeletal: Positive for myalgias and back pain. Negative for arthralgias.  Neurological: Negative for weakness and headaches.  All other systems reviewed and are negative.    Allergies  Hydrocodone-acetaminophen; Ibuprofen; Penicillins; Cleocin; and Keflex  Home Medications   Current Outpatient Rx  Name  Route  Sig  Dispense  Refill  . alprazolam (XANAX) 2 MG tablet   Oral   Take 1 tablet (2 mg total) by mouth 4 (four) times daily as needed for anxiety.   120 tablet   5   . FLUoxetine (PROZAC) 20 MG capsule   Oral   Take 20 mg by mouth daily.         . furosemide (LASIX) 20 MG tablet   Oral   Take 1 tablet (20 mg total) by mouth daily.   30 tablet   11   . oxyCODONE-acetaminophen (PERCOCET) 10-325 MG per tablet  Oral   Take 1 tablet by mouth every 4 (four) hours as needed for pain.   180 tablet   0    BP 116/79  Pulse 68  Temp(Src) 97.8 F (36.6 C) (Oral)  Resp 16  SpO2 96% Physical Exam  Nursing note and vitals reviewed. Constitutional: She is oriented to person, place, and time. She appears well-developed and well-nourished. No distress.  Neck: Neck supple.  Cardiovascular: Normal rate, regular rhythm and normal heart sounds.   Pulmonary/Chest: Effort normal and breath sounds normal. No respiratory distress. She has no wheezes. She has no rales.  Abdominal: Soft. Bowel sounds are normal. She exhibits no distension. There is tenderness. There is no rebound and no guarding.  Diffuse tenderness  Musculoskeletal: She exhibits edema.  Non pitting  peripheral edema in LE. No edema in hands or face.   Neurological: She is alert and oriented to person, place, and time.  Skin: Skin is warm and dry.    ED Course  Procedures (including critical care time)  Results for orders placed during the hospital encounter of 05/23/13  CBC WITH DIFFERENTIAL      Result Value Range   WBC 5.7  4.0 - 10.5 K/uL   RBC 3.82 (*) 3.87 - 5.11 MIL/uL   Hemoglobin 12.6  12.0 - 15.0 g/dL   HCT 81.1  91.4 - 78.2 %   MCV 100.3 (*) 78.0 - 100.0 fL   MCH 33.0  26.0 - 34.0 pg   MCHC 32.9  30.0 - 36.0 g/dL   RDW 95.6  21.3 - 08.6 %   Platelets 195  150 - 400 K/uL   Neutrophils Relative % 49  43 - 77 %   Neutro Abs 2.8  1.7 - 7.7 K/uL   Lymphocytes Relative 40  12 - 46 %   Lymphs Abs 2.3  0.7 - 4.0 K/uL   Monocytes Relative 10  3 - 12 %   Monocytes Absolute 0.6  0.1 - 1.0 K/uL   Eosinophils Relative 1  0 - 5 %   Eosinophils Absolute 0.1  0.0 - 0.7 K/uL   Basophils Relative 0  0 - 1 %   Basophils Absolute 0.0  0.0 - 0.1 K/uL  POCT I-STAT, CHEM 8      Result Value Range   Sodium 140  135 - 145 mEq/L   Potassium 3.5  3.5 - 5.1 mEq/L   Chloride 101  96 - 112 mEq/L   BUN 8  6 - 23 mg/dL   Creatinine, Ser 5.78  0.50 - 1.10 mg/dL   Glucose, Bld 78  70 - 99 mg/dL   Calcium, Ion 4.69  6.29 - 1.23 mmol/L   TCO2 27  0 - 100 mmol/L   Hemoglobin 13.6  12.0 - 15.0 g/dL   HCT 52.8  41.3 - 24.4 %   No results found.  Medications  oxyCODONE-acetaminophen (PERCOCET/ROXICET) 5-325 MG per tablet 1 tablet (1 tablet Oral Given 05/23/13 2000)  furosemide (LASIX) injection 40 mg (40 mg Intramuscular Given 05/23/13 2026)    No results found. 1. Peripheral edema   2. Chronic back pain     MDM  PT with minimal dependent edema in Extremities. Renal function normal. Electrolytes normal. Suspect dependent edema. Recently started on lasix. Given 40mg  IM in ED. Lungs are clear, doubt CHF. Pt is not complaining of chest pain or shortness of breath.   Explained that back  pain is chronic, no acute injuries. No new neuro deficitns reported, will  need to follow up with her PCP. She is constantly asking for pain medications in ED. Records reviewed, pt has had multiple visits to ED with pain complaints. Once in may was seen on 5/14 here for tooth ache, and next day was seen by Dr. Clent Ridges with back pain. This behavior is suspicious for drug seeking.   At this time, pt is stable, with normal vital signs, and will be discharged home with follow up.   Filed Vitals:   05/23/13 1700 05/23/13 2110  BP: 116/79 120/67  Pulse: 68   Temp: 97.8 F (36.6 C)   TempSrc: Oral   Resp: 16 14  SpO2: 96%      Lottie Mussel, PA-C 05/23/13 2115

## 2013-05-23 NOTE — ED Notes (Signed)
Pt states she has had swelling for the past week, worse in last 2 days.  Denies sob.  Pt states she has swelling in arms, legs and face.

## 2013-05-23 NOTE — ED Notes (Signed)
Pt ambulating independently to nurses station complaining of leg pain. rn informed pt she will get her in a room as quickly as possible.

## 2013-05-23 NOTE — Telephone Encounter (Signed)
Per Dr. Claris Che request called pt to advised that he could see her on 7/1.  Pt states she is swelling really bad and having chest pain.  Advised pt that if she was having chest pain pt needed to go to ER.  Pt verbalized understand and states she will go to ER then follow up with PCP.

## 2013-05-24 ENCOUNTER — Encounter: Payer: Self-pay | Admitting: Family Medicine

## 2013-05-24 ENCOUNTER — Ambulatory Visit (INDEPENDENT_AMBULATORY_CARE_PROVIDER_SITE_OTHER): Payer: Self-pay | Admitting: Family Medicine

## 2013-05-24 VITALS — BP 110/66 | HR 59 | Temp 97.8°F | Wt 192.0 lb

## 2013-05-24 DIAGNOSIS — R0789 Other chest pain: Secondary | ICD-10-CM

## 2013-05-24 DIAGNOSIS — R600 Localized edema: Secondary | ICD-10-CM

## 2013-05-24 DIAGNOSIS — R609 Edema, unspecified: Secondary | ICD-10-CM

## 2013-05-24 MED ORDER — FUROSEMIDE 40 MG PO TABS: 40.0000 mg | ORAL_TABLET | Freq: Every day | ORAL | Status: DC

## 2013-05-24 MED ORDER — POTASSIUM CHLORIDE CRYS ER 20 MEQ PO TBCR: 20.0000 meq | EXTENDED_RELEASE_TABLET | Freq: Every day | ORAL | Status: DC

## 2013-05-24 NOTE — Progress Notes (Signed)
  Subjective:    Patient ID: Lynn Wallace, female    DOB: 12/24/1970, 42 y.o.   MRN: [id number]  HPI Here to follow up an ER visit yesterday for swelling over the body. We had started her on Lasix a week or two ago for some leg edema. This seems to have been working since it caused her urinate more than normal. However yesterday she called to say she was swollen in the hands and even the face. No SOB. Her ER note says her exam did not reveal any edema except in the legs and feet. She was given a 40 mg infusion of Lasix and told to see [place]. Today she feels better.    Review of Systems  Constitutional: Negative.   Respiratory: Negative.   Cardiovascular: Positive for leg swelling. Negative for chest pain and palpitations.       Objective:   Physical Exam  Constitutional: She appears well-developed and well-nourished. No distress.  Cardiovascular: Normal rate, regular rhythm, normal heart sounds and intact distal pulses.   EKG normal   Pulmonary/Chest: Effort normal and breath sounds normal. No respiratory distress. She has no wheezes. She has no rales.  Musculoskeletal:  1+ edema of the feet           Assessment & Plan:  Stay on 40 mg of Lasix a day. Add potassium daily.

## 2013-05-24 NOTE — ED Provider Notes (Signed)
Medical screening examination/treatment/procedure(s) were performed by non-physician practitioner and as supervising physician I was immediately available for consultation/collaboration.  Sueanne Maniaci, MD 05/24/13 0121 

## 2013-06-06 ENCOUNTER — Telehealth: Payer: Self-pay | Admitting: Family Medicine

## 2013-06-06 NOTE — Telephone Encounter (Signed)
Pt requesting refill of oxyCODONE-acetaminophen (PERCOCET) 10-325 MG per tablet.  Patient informed Dr. Clent Ridges was out of the office until Thursday and medication would not be approved for refill until after he returns to the office.

## 2013-06-10 MED ORDER — TERCONAZOLE 0.8 % VA CREA: TOPICAL_CREAM | VAGINAL | Status: DC

## 2013-06-10 MED ORDER — OXYCODONE-ACETAMINOPHEN 10-325 MG PO TABS: 1.0000 | ORAL_TABLET | ORAL | Status: DC | PRN

## 2013-06-10 NOTE — Telephone Encounter (Signed)
done

## 2013-06-10 NOTE — Telephone Encounter (Signed)
Patient calling back quite upset that her medication is not ready. I explained, again, that Dr Clent Ridges just returned yesterday, and that he was in the process of answering all his calls, and it would be addressed just as soon as he could get to it. Pt then said she wanted to just make an appt for today and come in to see him. She stated it was d/t a lengthy menstrual cycle that has left that area raw. I advised I would send this to the nurse to see if she could be worked in today. When she realized I would need to call her back, she got angry and said she'd had it, and that we'd been lying to her since Monday about Dr. Clent Ridges. When I asked about what, she said we told her Dr Clent Ridges would be back Thurs to work a full day and she would have her medication. But that when she called yesterday afternoon, we told her he was gone for the day. I reminded her that Dr. Betsy Pries works a 1/2 day on Thursdays. Please advise re situation and appt.

## 2013-06-21 ENCOUNTER — Telehealth: Payer: Self-pay | Admitting: Family Medicine

## 2013-06-21 NOTE — Telephone Encounter (Signed)
done

## 2013-06-21 NOTE — Telephone Encounter (Signed)
Pt needs a letter for SSI disability stating she is unable to work. Pt needs letter by 06-24-13

## 2013-06-22 NOTE — Telephone Encounter (Signed)
Note is ready for pick up and I left a voice message for pt. 

## 2013-07-04 ENCOUNTER — Telehealth: Payer: Self-pay | Admitting: Family Medicine

## 2013-07-04 NOTE — Telephone Encounter (Signed)
Refill request for Percocet 10-325 mg take 1 po q4hrs prn. Pt would like to pick up script on 07/08/13.

## 2013-07-06 ENCOUNTER — Telehealth: Payer: Self-pay | Admitting: Family Medicine

## 2013-07-06 MED ORDER — OXYCODONE-ACETAMINOPHEN 10-325 MG PO TABS: 1.0000 | ORAL_TABLET | ORAL | Status: DC | PRN

## 2013-07-06 NOTE — Telephone Encounter (Signed)
Script is ready, can pick up on 07/08/13.

## 2013-07-06 NOTE — Telephone Encounter (Signed)
Pt is aware that Dr. Clent Ridges is out of the office this week and I will forward this to him. Another provider is not going to approve this, since pt just got script filled. I called pt and left voice message.

## 2013-07-06 NOTE — Telephone Encounter (Signed)
Refill once 

## 2013-07-06 NOTE — Telephone Encounter (Signed)
Pt states she left her RX for Xanex at the hospital when Steward Ros was admitted the other day. Pt states they were in his bag and hospital kept her Xanex. Would like to know if another MD could write her a script for alprazolam Prudy Feeler) 2 MG tablet.

## 2013-07-10 NOTE — Telephone Encounter (Signed)
No early refills. She needs to take care of this more carefully.

## 2013-07-11 NOTE — Telephone Encounter (Signed)
Patient is aware 

## 2013-07-13 ENCOUNTER — Ambulatory Visit: Payer: Self-pay | Admitting: Family Medicine

## 2013-07-13 DIAGNOSIS — Z0289 Encounter for other administrative examinations: Secondary | ICD-10-CM

## 2013-08-01 ENCOUNTER — Telehealth: Payer: Self-pay | Admitting: Family Medicine

## 2013-08-01 NOTE — Telephone Encounter (Signed)
Opened in error

## 2013-08-02 ENCOUNTER — Encounter: Payer: Self-pay | Admitting: Family Medicine

## 2013-08-02 ENCOUNTER — Ambulatory Visit (INDEPENDENT_AMBULATORY_CARE_PROVIDER_SITE_OTHER): Payer: Self-pay | Admitting: Family Medicine

## 2013-08-02 VITALS — BP 120/80 | HR 78 | Temp 98.0°F | Wt 183.0 lb

## 2013-08-02 DIAGNOSIS — F329 Major depressive disorder, single episode, unspecified: Secondary | ICD-10-CM

## 2013-08-02 DIAGNOSIS — F411 Generalized anxiety disorder: Secondary | ICD-10-CM

## 2013-08-02 MED ORDER — ALPRAZOLAM 2 MG PO TABS: 2.0000 mg | ORAL_TABLET | Freq: Four times a day (QID) | ORAL | Status: DC | PRN
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
## 2013-08-04 ENCOUNTER — Telehealth: Payer: Self-pay | Admitting: Family Medicine

## 2013-08-04 NOTE — Telephone Encounter (Signed)
Lynn Wallace would like her oxyCODONE-acetaminophen (PERCOCET) 10-325 MG per tablet refilled. She wants to pick it up Monday. I told her I understood, but for her to wait until Lynn Wallace calls her with it ready before coming to pick up.

## 2013-08-05 MED ORDER — OXYCODONE-ACETAMINOPHEN 10-325 MG PO TABS: 1.0000 | ORAL_TABLET | ORAL | Status: DC | PRN

## 2013-08-05 NOTE — Telephone Encounter (Signed)
Script is ready for pick up, not sure what # to call pt at?

## 2013-08-05 NOTE — Telephone Encounter (Signed)
Pt would like to pick up rx before 11 am on monday

## 2013-08-05 NOTE — Telephone Encounter (Signed)
done

## 2013-08-24 ENCOUNTER — Telehealth: Payer: Self-pay | Admitting: Family Medicine

## 2013-08-24 MED ORDER — SULFAMETHOXAZOLE-TMP DS 800-160 MG PO TABS: 1.0000 | ORAL_TABLET | Freq: Two times a day (BID) | ORAL | Status: DC

## 2013-08-24 NOTE — Telephone Encounter (Signed)
Call in Bactrim DS bid for 10 days  

## 2013-08-24 NOTE — Telephone Encounter (Signed)
I sent script e-scribe and left voice message for pt 

## 2013-08-24 NOTE — Telephone Encounter (Signed)
Pt has cough, head cold, chest cold, runny nose, no fever. would likw to know if md will call in antibiotic. [facility] Pt request $4 Rx

## 2013-08-25 ENCOUNTER — Telehealth: Payer: Self-pay | Admitting: Family Medicine

## 2013-08-25 NOTE — Telephone Encounter (Signed)
Pt states jury duty letter previously written needs to be put on letterhead and faxed to [phone] attn: Rico Junker.  Pt requesting callback to notify when this has been done.  Pt was advised PCP is out of office this afternoon.

## 2013-08-26 ENCOUNTER — Encounter: Payer: Self-pay | Admitting: Family Medicine

## 2013-08-26 NOTE — Telephone Encounter (Signed)
This letter is ready to be picked up.  

## 2013-08-26 NOTE — Telephone Encounter (Signed)
Called and spoke with pt and pt is aware. Pt states the letter needs to be faxed. The fax number is [phone] Attn:  Rico Junker with pt's name and phone number. Letter faxed and copy sent to the front desk.

## 2013-08-29 ENCOUNTER — Ambulatory Visit: Payer: Self-pay | Admitting: Family Medicine

## 2013-08-29 DIAGNOSIS — Z0289 Encounter for other administrative examinations: Secondary | ICD-10-CM

## 2013-08-31 ENCOUNTER — Telehealth: Payer: Self-pay | Admitting: Family Medicine

## 2013-08-31 NOTE — Telephone Encounter (Signed)
Pt called to request a refill of her oxyCODONE-acetaminophen (PERCOCET) 10-325 MG per tablet. Please assist.

## 2013-09-01 MED ORDER — OXYCODONE-ACETAMINOPHEN 10-325 MG PO TABS: 1.0000 | ORAL_TABLET | ORAL | Status: DC | PRN

## 2013-09-01 NOTE — Telephone Encounter (Signed)
Pt would like to discuss this with Nettie Elm when you have a chance.

## 2013-09-01 NOTE — Telephone Encounter (Signed)
Pt would like to come pick RX up today, and she states that she only has 1 ride. She would like to speak with Nettie Elm, asap. Please assist.

## 2013-09-01 NOTE — Telephone Encounter (Signed)
Pt wants a call when script is ready for pick up.

## 2013-09-01 NOTE — Telephone Encounter (Signed)
done

## 2013-09-01 NOTE — Telephone Encounter (Signed)
Pt is here to pick up script 

## 2013-09-01 NOTE — Addendum Note (Signed)
Addended by: Gershon Crane A on: 09/01/2013 10:14 AM   Modules accepted: Orders

## 2013-09-15 ENCOUNTER — Telehealth: Payer: Self-pay | Admitting: Family Medicine

## 2013-09-15 NOTE — Telephone Encounter (Signed)
Pt would like same abx she received from [facility] cost 4 dollars. Pt having same sympton cough/congestion

## 2013-09-16 ENCOUNTER — Telehealth: Payer: Self-pay | Admitting: Family Medicine

## 2013-09-16 MED ORDER — DOXYCYCLINE HYCLATE 100 MG PO TABS: 100.0000 mg | ORAL_TABLET | Freq: Two times a day (BID) | ORAL | Status: DC

## 2013-09-16 NOTE — Telephone Encounter (Signed)
Call in Doxycycline 100 mg bid for 10 days  

## 2013-09-16 NOTE — Telephone Encounter (Signed)
[name] orange care has expired. She is going next week to apply again. Pt would like to know if Dr Clent Ridges will accept her orange card once she gets it approved. pls advise.

## 2013-09-16 NOTE — Telephone Encounter (Signed)
I sent script e-scribe and left a voice message for pt. 

## 2013-09-16 NOTE — Telephone Encounter (Signed)
I left a voice message for pt, we do not accept the orange card.

## 2013-09-19 ENCOUNTER — Encounter: Payer: Self-pay | Admitting: Family Medicine

## 2013-09-19 ENCOUNTER — Ambulatory Visit (INDEPENDENT_AMBULATORY_CARE_PROVIDER_SITE_OTHER): Payer: Self-pay | Admitting: Family Medicine

## 2013-09-19 VITALS — BP 110/70 | HR 82 | Temp 97.6°F | Wt 184.0 lb

## 2013-09-19 DIAGNOSIS — J019 Acute sinusitis, unspecified: Secondary | ICD-10-CM

## 2013-09-19 MED ORDER — SULFAMETHOXAZOLE-TMP DS 800-160 MG PO TABS: 1.0000 | ORAL_TABLET | Freq: Two times a day (BID) | ORAL | Status: DC

## 2013-09-19 MED ORDER — HYDROCODONE-HOMATROPINE 5-1.5 MG/5ML PO SYRP: 5.0000 mL | ORAL_SOLUTION | ORAL | Status: DC | PRN

## 2013-09-19 NOTE — Progress Notes (Signed)
  Subjective:    Patient ID: Lynn Wallace, female    DOB: Feb 25, 1971, 42 y.o.   MRN: [id number]  HPI Here for one month of sinus pressure, PND, ST,and a dry cough.    Review of Systems  Constitutional: Negative.   HENT: Positive for congestion, postnasal drip and sinus pressure.   Eyes: Negative.   Respiratory: Positive for cough.        Objective:   Physical Exam  Constitutional: She appears well-developed and well-nourished.  HENT:  Right Ear: External ear normal.  Left Ear: External ear normal.  Nose: Nose normal.  Mouth/Throat: Oropharynx is clear and moist.  Eyes: Conjunctivae are normal.  Pulmonary/Chest: Effort normal and breath sounds normal.  Lymphadenopathy:    She has no cervical adenopathy.          Assessment & Plan:  Add Mucinex prn

## 2013-09-26 ENCOUNTER — Telehealth: Payer: Self-pay | Admitting: Family Medicine

## 2013-09-26 NOTE — Telephone Encounter (Signed)
Pt is due for script on over weekend, however you are going to be out of office on Friday.

## 2013-09-26 NOTE — Telephone Encounter (Signed)
Too early, this is not due until 10-02-13

## 2013-09-26 NOTE — Telephone Encounter (Signed)
Pt requesting refill of oxyCODONE-acetaminophen (PERCOCET) 10-325 MG per tablet please call when ready for pick up.

## 2013-09-27 NOTE — Telephone Encounter (Signed)
Pt is leaving to go out of town today at [employer] and needs rx before 4pm.  pls advise.

## 2013-09-29 MED ORDER — OXYCODONE-ACETAMINOPHEN 10-325 MG PO TABS: 1.0000 | ORAL_TABLET | ORAL | Status: DC | PRN

## 2013-09-29 NOTE — Telephone Encounter (Signed)
done

## 2013-09-30 NOTE — Telephone Encounter (Signed)
Script is ready for pick up and pt is aware. 

## 2013-10-25 ENCOUNTER — Telehealth: Payer: Self-pay | Admitting: Family Medicine

## 2013-10-25 ENCOUNTER — Other Ambulatory Visit: Payer: Self-pay | Admitting: Family Medicine

## 2013-10-25 MED ORDER — OXYCODONE-ACETAMINOPHEN 10-325 MG PO TABS: 1.0000 | ORAL_TABLET | ORAL | Status: DC | PRN

## 2013-10-25 MED ORDER — ALPRAZOLAM 2 MG PO TABS: 2.0000 mg | ORAL_TABLET | Freq: Four times a day (QID) | ORAL | Status: DC | PRN

## 2013-10-25 NOTE — Telephone Encounter (Signed)
Script is ready for pick up, contract printed and I spoke with pt. 

## 2013-10-25 NOTE — Telephone Encounter (Signed)
Gave pt Substance Abuse Contract, she stated "she didn't have to pee" and says "she will come back tomorrow".

## 2013-10-25 NOTE — Telephone Encounter (Signed)
I called in script and spoke with pt. 

## 2013-10-25 NOTE — Telephone Encounter (Signed)
Done but she needs to sign a contract and be tested BEFORE she can pick this up

## 2013-10-25 NOTE — Telephone Encounter (Signed)
Dr. Clent Ridges is aware and pt will have to sign the contract and give urine sample before the next script is printed.

## 2013-10-25 NOTE — Telephone Encounter (Signed)
Pt will need new rx on 10/27/13 for percocet

## 2013-10-25 NOTE — Telephone Encounter (Signed)
Call in #120 with no refills. She needs to sign a contract and be tested

## 2013-11-04 ENCOUNTER — Encounter: Payer: Self-pay | Admitting: Family Medicine

## 2013-11-21 ENCOUNTER — Telehealth: Payer: Self-pay | Admitting: Family Medicine

## 2013-11-21 MED ORDER — ALPRAZOLAM 2 MG PO TABS: 2.0000 mg | ORAL_TABLET | Freq: Four times a day (QID) | ORAL | Status: DC | PRN

## 2013-11-21 MED ORDER — OXYCODONE-ACETAMINOPHEN 10-325 MG PO TABS: 1.0000 | ORAL_TABLET | ORAL | Status: DC | PRN

## 2013-11-21 NOTE — Telephone Encounter (Signed)
I printed both scripts and pt is here now to pick up.

## 2013-11-21 NOTE — Telephone Encounter (Signed)
Pt is requesting a refill of heralprazolam (XANAX) 2 MG tablet and OXYCODONE-ACETAMINOPHEN 10-325 MG PO TABS. Also, please note that pt cancelled her med fu appointment for 11/28/13.

## 2013-11-21 NOTE — Telephone Encounter (Signed)
I spoke with pt and she will be out of her medications before Dr. Clent Ridges returns on 11/25/12. She stated that you have approved these medications in the past when Dr. Clent Ridges was out. Can we approve these scripts?

## 2013-11-21 NOTE — Telephone Encounter (Signed)
Refill for one month 

## 2013-11-28 ENCOUNTER — Ambulatory Visit: Payer: Self-pay | Admitting: Family Medicine

## 2013-12-05 ENCOUNTER — Encounter (HOSPITAL_COMMUNITY): Payer: Self-pay | Admitting: Emergency Medicine

## 2013-12-05 ENCOUNTER — Emergency Department (HOSPITAL_COMMUNITY)
Admission: EM | Admit: 2013-12-05 | Discharge: 2013-12-05 | Disposition: A | Discharge status: Home / Self Care | Payer: No Typology Code available for payment source | Attending: Emergency Medicine | Admitting: Emergency Medicine

## 2013-12-05 ENCOUNTER — Encounter: Payer: Self-pay | Admitting: Family Medicine

## 2013-12-05 ENCOUNTER — Ambulatory Visit (INDEPENDENT_AMBULATORY_CARE_PROVIDER_SITE_OTHER): Payer: No Typology Code available for payment source | Admitting: Family Medicine

## 2013-12-05 VITALS — BP 114/66 | HR 86 | Temp 98.6°F | Wt 186.0 lb

## 2013-12-05 DIAGNOSIS — M545 Low back pain, unspecified: Secondary | ICD-10-CM

## 2013-12-05 DIAGNOSIS — Z79899 Other long term (current) drug therapy: Secondary | ICD-10-CM | POA: Insufficient documentation

## 2013-12-05 DIAGNOSIS — Z8719 Personal history of other diseases of the digestive system: Secondary | ICD-10-CM | POA: Insufficient documentation

## 2013-12-05 DIAGNOSIS — Z87440 Personal history of urinary (tract) infections: Secondary | ICD-10-CM | POA: Insufficient documentation

## 2013-12-05 DIAGNOSIS — Z792 Long term (current) use of antibiotics: Secondary | ICD-10-CM | POA: Insufficient documentation

## 2013-12-05 DIAGNOSIS — Z880 Allergy status to penicillin: Secondary | ICD-10-CM | POA: Insufficient documentation

## 2013-12-05 DIAGNOSIS — F1920 Other psychoactive substance dependence, uncomplicated: Secondary | ICD-10-CM

## 2013-12-05 DIAGNOSIS — R630 Anorexia: Secondary | ICD-10-CM | POA: Insufficient documentation

## 2013-12-05 DIAGNOSIS — F319 Bipolar disorder, unspecified: Secondary | ICD-10-CM | POA: Insufficient documentation

## 2013-12-05 DIAGNOSIS — F17200 Nicotine dependence, unspecified, uncomplicated: Secondary | ICD-10-CM | POA: Insufficient documentation

## 2013-12-05 DIAGNOSIS — G8929 Other chronic pain: Secondary | ICD-10-CM | POA: Insufficient documentation

## 2013-12-05 DIAGNOSIS — F411 Generalized anxiety disorder: Secondary | ICD-10-CM | POA: Insufficient documentation

## 2013-12-05 DIAGNOSIS — R6883 Chills (without fever): Secondary | ICD-10-CM | POA: Insufficient documentation

## 2013-12-05 MED ORDER — OXYCODONE-ACETAMINOPHEN 5-325 MG PO TABS: 1.0000 | ORAL_TABLET | Freq: Four times a day (QID) | ORAL | Status: DC | PRN

## 2013-12-05 MED ORDER — GABAPENTIN 100 MG PO CAPS: 100.0000 mg | ORAL_CAPSULE | Freq: Three times a day (TID) | ORAL | Status: DC

## 2013-12-05 NOTE — Progress Notes (Signed)
Pre visit review using our clinic review tool, if applicable. No additional management support is needed unless otherwise documented below in the visit note. 

## 2013-12-05 NOTE — ED Notes (Signed)
Pt presents with chronic back pain that became worse today.
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
## 2013-12-05 NOTE — Discharge Instructions (Signed)
Chronic Back Pain ° When back pain lasts longer than 3 months, it is called chronic back pain. People with chronic back pain often go through certain periods that are more intense (flare-ups).  °CAUSES °Chronic back pain can be caused by wear and tear (degeneration) on different structures in your back. These structures include: °· The bones of your spine (vertebrae) and the joints surrounding your spinal cord and nerve roots (facets). °· The strong, fibrous tissues that connect your vertebrae (ligaments). °Degeneration of these structures may result in pressure on your nerves. This can lead to constant pain. °HOME CARE INSTRUCTIONS °· Avoid bending, heavy lifting, prolonged sitting, and activities which make the problem worse. °· Take brief periods of rest throughout the day to reduce your pain. Lying down or standing usually is better than sitting while you are resting. °· Take over-the-counter or prescription medicines only as directed by your caregiver. °SEEK IMMEDIATE MEDICAL CARE IF:  °· You have weakness or numbness in one of your legs or feet. °· You have trouble controlling your bladder or bowels. °· You have nausea, vomiting, abdominal pain, shortness of breath, or fainting. °Document Released: 12/18/2004 Document Revised: 02/02/2012 Document Reviewed: 10/25/2011 °ExitCare® Patient Information ©2014 [employer]. ° ° °Emergency Department Resource Guide °1) Find a Doctor and Pay Out of Pocket °Although you won't have to find out who is covered by your insurance plan, it is a good idea to ask around and get recommendations. You will then need to call the office and see if the doctor you have chosen will accept you as a new patient and what types of options they offer for patients who are self-pay. Some doctors offer discounts or will set up payment plans for their patients who do not have insurance, but you will need to ask so you aren't surprised when you get to your appointment. ° °2) Contact Your Local  Health Department °Not all health departments have doctors that can see patients for sick visits, but many do, so it is worth a call to see if yours does. If you don't know where your local health department is, you can check in your phone book. The CDC also has a tool to help you locate your state's health department, and many state websites also have listings of all of their local health departments. ° °3) Find a Walk-in Clinic °If your illness is not likely to be very severe or complicated, you may want to try a walk in clinic. These are popping up all over the country in pharmacies, drugstores, and shopping centers. They're usually staffed by nurse practitioners or physician assistants that have been trained to treat common illnesses and complaints. They're usually fairly quick and inexpensive. However, if you have serious medical issues or chronic medical problems, these are probably not your best option. ° °No Primary Care Doctor: °- Call [facility] at  832-8000 - they can help you locate a primary care doctor that  accepts your insurance, provides certain services, etc. °- Physician Referral Service- 1-800-533-3463 ° °Chronic Pain Problems: °Organization         Address  Phone   Notes  °[facility]  (336) 297-2271 Patients need to be referred by their primary care doctor.  ° °Medication Assistance: °Organization         Address  Phone   Notes  °[place] [facility] [address] °[place], [place] [address] (336) 641-8030 --Must be a resident of [place] °--   resident of [place] °-- Must have NO insurance coverage whatsoever (no Medicaid/ Medicare, etc.) °-- The pt. MUST have a primary care doctor that directs their care regularly and follows them in the community °  °[facility]  (866) 331-1348   °[employer]  (888) 892-1162   ° °Agencies that provide inexpensive medical care: °Organization         Address  Phone   Notes  °[facility]  (336) 832-8035    °[facility]    (336) 832-7272   °[facility] [address] °[place], [place] [address] (336) 832-4777   °[facility] [address], °[place] (336) 271-4999   °[facility]    (336) 373-0678   °[facility]    (336) 272-1050   °[facility] ° [address], [place] Phone:  (336) 832-4444, Fax:  (336) 832-4440 Hours of Operation:  9 am - 6 pm, M-F.  Also accepts Medicaid/Medicare and self-pay.  °[facility] ° [address], [place] Phone: (336) 832-3150, Fax: (336) 832-3151. Hours of Operation:  8:30 am - 5:30 pm, M-F.  Also accepts Medicaid and self-pay.  °[facility] [place] [address], [place] Phone: (336) 878-6027   °[facility] [address], [place], [place] (336)723-1848, Ext. 123 Mondays & Thursdays: 7-9 AM.  First 15 patients are seen on a first come, first serve basis. °  ° °Medicaid-accepting [place] Providers: ° °Organization         Address  Phone   Notes  °[facility] [address], [place] (336) 641-2100 Also accepts self-pay patients.  °[facility] [address], [place] ° (336) 856-9996   °[facility] [address], [place] (336) 288-8857   °[facility] [address] [place] [address], [place] (336) 299-7000   °Veita Bland [address], [place]  ° (336) 373-1557 Only accepts [place] Access Medicaid patients after they have their name applied to their card.  ° °Self-Pay (no insurance) in [place]: ° °Organization         Address  Phone   Notes  °Sickle Cell Patients, [facility] [address], [place] (336) 832-1970   °[facility] [address], [place] (336) 832-4400   °[facility] [place] [facility] ° [address],  [place] (336) 992-4800   °[facility]/Dr. Osei-Bonsu ° [address] [place] [address], [place] or [address], [place] (336) 841-8500 Phone number for both [place] and [place] locations is the same.  °[facility] [address], [place] (336) 299-0000   °[facility] [address] [place] [address],  or [address] (336) 852-7530 °(336) 878-2260   °[facility] [address],  (336) 350-1642, phone; (336) 294-5005, fax Sees patients 1st and 3rd Saturday of every month.  Must not qualify for public or private insurance (i.e. Medicaid, Medicare,  Health Choice, Veterans' Benefits) • Household income should be no more than 200% of the poverty level •The clinic cannot treat you if you are pregnant or think you are pregnant • Sexually transmitted diseases are not treated at the clinic.  ° °Dental Care: °Organization         Address    Phone  Notes  °[facility] [address], [place] (336) 641-6152 Accepts children up to age 21 who are enrolled in Medicaid or [place] Health Choice; pregnant women with a Medicaid card; and children who have applied for Medicaid or [place] Health Choice, but were declined, whose parents can pay a reduced fee at time of service.  °[facility] [place]  [address], [place] (336) 641-7733 Accepts children up to age 21 who are enrolled in Medicaid or [place] Health Choice; pregnant women with a Medicaid card; and children who have applied for Medicaid or [place] Health Choice, but were declined, whose parents can pay a reduced fee at time of service.  °[facility] ° [address], [place] (336) 641-4533 Patients are seen by appointment only. Walk-ins are not accepted. [facility] will see patients 18 years of age and older. °Monday - Tuesday (8am-5pm) °Most Wednesdays  (8:30-5pm) °$30 per visit, cash only  °[facility] ° [address], [place] (336) 641-4533 Patients are seen by appointment only. Walk-ins are not accepted. [facility] will see patients 18 years of age and older. °One Wednesday Evening (Monthly: Volunteer Based).  $30 per visit, cash only  °[facility]  (919) 537-3737 for adults; Children under age 4, call Graduate Pediatric Dentistry at (919) [phone]. Children aged 4-14, please call (919) [phone] to request a pediatric application. ° Dental services are provided in all areas of dental care including fillings, crowns and bridges, complete and partial dentures, implants, gum treatment, root canals, and extractions. Preventive care is also provided. Treatment is provided to both adults and children. °Patients are selected via a lottery and there is often a waiting list. °  °[facility] [address], °[place] ° (336) 763-8833 [email or web address] °  °[facility] [address], [place], [place] (336)723-1848, Ext. 123 Second and Fourth Thursday of each month, opens at 6:30 AM; Clinic ends at 9 AM.  Patients are seen on a first-come first-served basis, and a limited number are seen during each clinic.  ° °[facility] ° [address], [place], [place] (336) 723-7904   Eligibility Requirements °You must have lived in [place], [place], or [place] counties for at least the last three months. °  You cannot be eligible for state or federal sponsored healthcare insurance, including Veterans Administration, Medicaid, or Medicare. °  You generally cannot be eligible for healthcare insurance through your employer.  °  How to apply: °Eligibility screenings are held every Tuesday and Wednesday afternoon from 1:00 pm until 4:00 pm. You do not need an appointment for the interview!  °[facility] [address], [place], [place] 336-631-2330   °[place]  [facility]  336-342-8273   °[facility]  336-703-3100   °[facility]  336-570-6415   ° °Behavioral Health Resources in the Community: °Intensive Outpatient Programs °Organization         Address  Phone  Notes  °[facility] [address], [place], [place] 336-878-6098   °[facility] [address], [place], [place] 336-832-9800   °[facility] [address], [place],  ° 336-882-2125   °[facility] [address],  °[place],  1-800-853-5163 or 336-641-4981   °Substance Abuse Resources °Organization           Address  Phone  Notes  °[facility]  336-882-2125   °[facility]  336-784-9470   °[facility]  336-285-9073   °[facility]  336-845-3988   °Residential & Outpatient Substance Abuse Program  1-800-659-3381   °Psychological Services °Organization         Address  Phone  Notes  °[facility]  336- 832-9600   °[facility]  336- 378-7881   °[facility] [address], [place] 1-800-853-5163 or 336-641-4981   ° °Mobile Crisis Teams °Organization         Address  Phone  Notes  °[facility], Mobile Crisis Care Unit  1-877-626-1772   °Assertive °[facility] ° [address] [place], [place] 336-834-9664   °[name] DeEsch [address] °[place] 336-554-5454   ° °Self-Help/Support Groups °Organization         Address  Phone             Notes  °[facility] - variety of support groups  336- 373-1402 Call for more information  °Narcotics Anonymous (NA), [facility] [address], °[place]  2 meetings at this location  ° °Residential Treatment Programs °Organization         Address  Phone  Notes  °[facility] [address],    °[place]  1-866-801-8205   °[facility] ° [address], [place], [place]  704-293-8524   °[facility] [address], [place] 336-845-3988 Admissions: 8am-3pm M-F  °[facility] [address]    °[place], [place] 336-841-1104   °[facility] [address], [place], [place] 336-379-7146   °[facility] [address]  °[place], [place] 336-285-9073   °[facility] - Intensive Outpatient [address], [place], [place] 336-852-3033   °[facility] [address]  °[place], [place] 1-877-615-2722 or 336-784-9470   °[facility] [address] [place], [place] 336-227-7417 Accepts Medicaid  °[facility] [address]  °[place] 1-800-659-3381 Substance Abuse/Addiction Treatment  ° °[place] Behavioral Health Resources °Organization         Address  Phone  Notes  °[facility]  (888) 581-9988   °Julie Brannon, PhD [address] [place], [place]   (336) 349-5553 or (336) 951-0000   °[facility]   [address] °[place], [place] (336) 349-4454   °[facility] [address], [place], [place] (336) 342-8316 Insurance/Medicaid/sponsorship through [facility]  °[facility] [address]                                    [place], [place] (336) 342-8316 Therapy/tele-psych/case  °[facility] [address]  ° [place], [place] (336) 349-2233    °Dr. Arfeen  (336) 349-4544   °[facility] [place]  [employer] [place] [facility] 1) [address], [place] °2) [address], [place] °3)  [address], [place] (336) 349-3220 °(336) 342-7768 ° °(336) 342-8140   °[place] [facility] (336) 342-1394 or (336) 342-3537 (After Hours)    ° °   °
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
## 2013-12-05 NOTE — Progress Notes (Signed)
   Subjective:    Patient ID: Lynn Wallace, female    DOB: March 15, 1971, 43 y.o.   MRN: [id number]  HPI Here asking for early refills on her pain meds stating she is "hurting all over" and she ran out early because she has been taking 2 Percocets at a time lately. Today I confronted her with her recent drug testing which showed she has been using cocaine. Of course this is in direct violation of the drug use contract she had signed with [place].    Review of Systems  Constitutional: Negative.   Musculoskeletal: Positive for arthralgias, back pain and myalgias.       Objective:   Physical Exam  Constitutional: She appears well-developed and well-nourished.          Assessment & Plan:  I told her that because of her illicit drug use we would no longer be able to care for her. I told her she is now discharged from all of [employer] and that she would be receiving a letter to this effect. I refused to give her any more controlled meds today. Instead I wrote a rx to try Gabapentin, but she threw this in the trash can and walked out of the clinic.

## 2013-12-06 ENCOUNTER — Ambulatory Visit: Payer: Self-pay | Admitting: Family Medicine

## 2013-12-06 ENCOUNTER — Telehealth: Payer: Self-pay | Admitting: Family Medicine

## 2013-12-06 ENCOUNTER — Emergency Department (HOSPITAL_COMMUNITY)
Admission: EM | Admit: 2013-12-06 | Discharge: 2013-12-06 | Disposition: A | Discharge status: Home / Self Care | Payer: No Typology Code available for payment source | Attending: Emergency Medicine | Admitting: Emergency Medicine

## 2013-12-06 ENCOUNTER — Encounter (HOSPITAL_COMMUNITY): Payer: Self-pay | Admitting: Emergency Medicine

## 2013-12-06 DIAGNOSIS — Z3202 Encounter for pregnancy test, result negative: Secondary | ICD-10-CM | POA: Insufficient documentation

## 2013-12-06 DIAGNOSIS — G8929 Other chronic pain: Secondary | ICD-10-CM | POA: Insufficient documentation

## 2013-12-06 DIAGNOSIS — Z880 Allergy status to penicillin: Secondary | ICD-10-CM | POA: Insufficient documentation

## 2013-12-06 DIAGNOSIS — F411 Generalized anxiety disorder: Secondary | ICD-10-CM | POA: Insufficient documentation

## 2013-12-06 DIAGNOSIS — M549 Dorsalgia, unspecified: Secondary | ICD-10-CM

## 2013-12-06 DIAGNOSIS — Z87440 Personal history of urinary (tract) infections: Secondary | ICD-10-CM | POA: Insufficient documentation

## 2013-12-06 DIAGNOSIS — M545 Low back pain, unspecified: Secondary | ICD-10-CM | POA: Insufficient documentation

## 2013-12-06 DIAGNOSIS — K219 Gastro-esophageal reflux disease without esophagitis: Secondary | ICD-10-CM | POA: Insufficient documentation

## 2013-12-06 DIAGNOSIS — Z79899 Other long term (current) drug therapy: Secondary | ICD-10-CM | POA: Insufficient documentation

## 2013-12-06 DIAGNOSIS — F319 Bipolar disorder, unspecified: Secondary | ICD-10-CM | POA: Insufficient documentation

## 2013-12-06 DIAGNOSIS — F17200 Nicotine dependence, unspecified, uncomplicated: Secondary | ICD-10-CM | POA: Insufficient documentation

## 2013-12-06 LAB — URINALYSIS, ROUTINE W REFLEX MICROSCOPIC
Bilirubin Urine: NEGATIVE
Glucose, UA: NEGATIVE mg/dL
Hgb urine dipstick: NEGATIVE
Ketones, ur: NEGATIVE mg/dL
NITRITE: NEGATIVE
Protein, ur: NEGATIVE mg/dL
SPECIFIC GRAVITY, URINE: 1.029 (ref 1.005–1.030)
Urobilinogen, UA: 0.2 mg/dL (ref 0.0–1.0)
pH: 5 (ref 5.0–8.0)

## 2013-12-06 LAB — URINE MICROSCOPIC-ADD ON

## 2013-12-06 LAB — PREGNANCY, URINE: PREG TEST UR: NEGATIVE

## 2013-12-06 MED ORDER — OXYCODONE-ACETAMINOPHEN 5-325 MG PO TABS
2.0000 | ORAL_TABLET | Freq: Once | ORAL | Status: AC
  Administered 2013-12-06: 2 via ORAL
  Filled 2013-12-06: qty 2

## 2013-12-06 NOTE — Telephone Encounter (Signed)
Patient dismissed from [facility] by Alysia Penna MD , effective December 05, 2013. Dismissal letter sent out by certified / registered mail. DAJ

## 2013-12-06 NOTE — ED Provider Notes (Signed)
CSN: [id number]     Arrival date & time 12/06/13  1800 History  This chart was scribed for non-physician practitioner working with [name], MD by [name], ED Scribe. This patient was seen in room TR10C/TR10C and the patient's care was started at 7:21 PM.    Chief Complaint  Patient presents with  . Back Pain   The history is provided by the patient. No language interpreter was used.   HPI Comments: Lynn Wallace is a 43 y.o. female with a PMH of chronic lower back pain, IBS, UTI, polysubstance abuse, GERD, and bipolar disorder who presents to the Emergency Department complaining of back pain onset 6 years ago that worsened in the last few days.  Her constant back pain is unchanged from her chronic back pain.  No new injuries or trauma.  Her pain is located in her lower and middle back throughout and she has intermittent pain shoots down bilateral legs. She states the pain worsens with movement. Patient has been taking Percocet which controls her pain. She has tried other medications such as naprosyn and flexeril with no relief in the past.  She states she had a disagreement with her PCP yesterday and was dismissed from his office so she is looking for a new PCP. She was seen at [facility] last night for the same. She denies neck pain, CP, abdominal pain, dyspnea, dysuria, vomiting, diarrhea, urinary or bowel incontinence.    Past Medical History  Diagnosis Date  . Polysubstance abuse     including narcotics, cocaine, benzodiazepines,  and marijuana  . Psychiatric problem     multiple addmissons for detox  . 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 Chronic low back pain   . GERD (gastroesophageal reflux disease)   . Irritable bowel syndrome (IBS)   . Anxiety   . UTI (lower urinary tract infection)    Past Surgical History  Procedure Laterality Date  . Tubal ligation    . Dilation and curettage of uterus     Family History  Problem Relation  Age of Onset  . Coronary artery disease      Female 1st degree relative <60  . Lung cancer    . Stroke      M 1st degree relative <50  . Arthritis Mother    History  Substance Use Topics  . Smoking status: Current Every Day Smoker    Types: Cigarettes  . Smokeless tobacco: Never Used     Comment: 1/2 pack or less  . Alcohol Use: No   OB History   Grav Para Term Preterm Abortions TAB SAB Ect Mult Living   7 5 5  0 2 1 1   5      Review of Systems  Respiratory: Negative for shortness of breath.   Cardiovascular: Negative for chest pain.  Gastrointestinal: Negative for nausea, vomiting and diarrhea.  Genitourinary: Negative for dysuria and enuresis.  Musculoskeletal: Positive for back pain. Negative for neck pain.  All other systems reviewed and are negative.   Allergies  Hydrocodone-acetaminophen; Ibuprofen; Penicillins; Cleocin; and Keflex  Home Medications   Current Outpatient Rx  Name  Route  Sig  Dispense  Refill  . alprazolam (XANAX) 2 MG tablet   Oral   Take 1 tablet (2 mg total) by mouth 4 (four) times daily as needed for anxiety.   120 tablet   0   . FLUoxetine (PROZAC) 20 MG capsule   Oral  Take 20 mg by mouth daily.         Marland Kitchen gabapentin (NEURONTIN) 100 MG capsule   Oral   Take 1 capsule (100 mg total) by mouth 3 (three) times daily.   90 capsule   0   . oxyCODONE-acetaminophen (PERCOCET) 10-325 MG per tablet   Oral   Take 1 tablet by mouth every 4 (four) hours as needed for pain.   180 tablet   0   . oxyCODONE-acetaminophen (PERCOCET/ROXICET) 5-325 MG per tablet   Oral   Take 1-2 tablets by mouth every 6 (six) hours as needed for severe pain.   6 tablet   0    BP 104/72  Pulse 72  Temp(Src) 98 F (36.7 C) (Oral)  Resp 18  SpO2 100%  LMP 10/05/2013  Filed Vitals:   12/06/13 1804 12/06/13 2029 12/06/13 2123  BP: 104/72 91/66 99/74   Pulse: 72 58 63  Temp: 98 F (36.7 C) 97.4 F (36.3 C) 97.3 F (36.3 C)  TempSrc: Oral Oral Oral   Resp: 18 20 16   SpO2: 100% 95% 96%    Physical Exam  Nursing note and vitals reviewed. Constitutional: She is oriented to person, place, and time. She appears well-developed and well-nourished. No distress.  HENT:  Head: Normocephalic and atraumatic.  Right Ear: External ear normal.  Left Ear: External ear normal.  Nose: Nose normal.  Mouth/Throat: Oropharynx is clear and moist.  Eyes: Conjunctivae and EOM are normal. Right eye exhibits no discharge. Left eye exhibits no discharge.  Neck: Normal range of motion. Neck supple. No tracheal deviation present.  No cervical spinal or paraspinal tenderness to palpation throughout.  No limitations with neck ROM.    Cardiovascular: Normal rate, regular rhythm, normal heart sounds and intact distal pulses.  Exam reveals no gallop and no friction rub.   No murmur heard. Dorsalis pedis pulses present and equal bilaterally  Pulmonary/Chest: Effort normal and breath sounds normal. No respiratory distress. She has no wheezes. She has no rales. She exhibits no tenderness.  Abdominal: Soft. Bowel sounds are normal. She exhibits no distension and no mass. There is no tenderness. There is no rebound and no guarding.  Musculoskeletal: Normal range of motion. She exhibits tenderness. She exhibits no edema.       Back:  Positive leg raise on the left.  Diffuse tenderness to palpation to the thoracic and lumbar spine and paraspinal muscles throughout.  Strength 5/5 in the upper and lower extremities bilaterally.  Patient able to ambulate without difficulty or ataxia.    Neurological: She is alert and oriented to person, place, and time.  Patellar reflexes intact bilaterally  Skin: Skin is warm and dry. She is not diaphoretic.  No lacerations, edema, ecchymosis, or erythema to the back throughout  Psychiatric: She has a normal mood and affect. Her behavior is normal.    ED Course  Procedures (including critical care time) Medications   oxyCODONE-acetaminophen (PERCOCET/ROXICET) 5-325 MG per tablet 2 tablet (2 tablets Oral Given 12/06/13 1943)    DIAGNOSTIC STUDIES: Oxygen Saturation is 100% on RA, normal by my interpretation.    COORDINATION OF CARE: 7:32 PM- Discussed treatment plan with pt. Pt agrees to plan.   Labs Review Labs Reviewed  PREGNANCY, URINE  URINALYSIS, ROUTINE W REFLEX MICROSCOPIC   Imaging Review No results found.  EKG Interpretation   None      Results for orders placed during the hospital encounter of 12/06/13  URINALYSIS, ROUTINE W REFLEX MICROSCOPIC  Result Value Range   Color, Urine YELLOW  YELLOW   APPearance CLOUDY (*) CLEAR   Specific Gravity, Urine 1.029  1.005 - 1.030   pH 5.0  5.0 - 8.0   Glucose, UA NEGATIVE  NEGATIVE mg/dL   Hgb urine dipstick NEGATIVE  NEGATIVE   Bilirubin Urine NEGATIVE  NEGATIVE   Ketones, ur NEGATIVE  NEGATIVE mg/dL   Protein, ur NEGATIVE  NEGATIVE mg/dL   Urobilinogen, UA 0.2  0.0 - 1.0 mg/dL   Nitrite NEGATIVE  NEGATIVE   Leukocytes, UA SMALL (*) NEGATIVE  PREGNANCY, URINE      Result Value Range   Preg Test, Ur NEGATIVE  NEGATIVE  URINE MICROSCOPIC-ADD ON      Result Value Range   Squamous Epithelial / LPF MANY (*) RARE   WBC, UA 3-6  <3 WBC/hpf   RBC / HPF 0-2  <3 RBC/hpf   Bacteria, UA MANY (*) RARE    MDM   Mykalah Charmian Forbis is a 43 y.o. female with a PMH of chronic lower back pain, IBS, UTI, polysubstance abuse, GERD, and bipolar disorder who presents to the Emergency Department complaining of back pain onset 6 years ago that worsened in the last few days.   Rechecks  8:40 PM = Patient ambulating in the hallway without difficulty.  States pain mildly improved.  Patient asking to leave.  Upset that I would not refill her Percocet prescription.  Does not want to try other medications.      Etiology of back pain likely chronic and musculoskeletal in nature.  Patient denies any acute changes.  Patient seen and evaluated at  [facility] for a similar complaint and was prescribed Percocet.  Patient returns today for continued pain control.  Patient states she ran out of her Percocet.  She denies any injuries/trauma.  Patient neurovascularly intact.  UA not highly suggestive of a UTI.  No concerning history or symptoms including hx of cancer, IVDU, weakness, loss of bowel/bladder function, or fever.  Patient provided resources for follow-up of a new provider.  Return precautions, discharge instructions, and follow-up was discussed with the patient before discharge.     Discharge Medication List as of 12/06/2013  9:26 PM      Final impressions: 1. Back pain      Denman George   I personally performed the services described in this documentation, which was scribed in my presence. The recorded information has been reviewed and is accurate.       Lucila Maine, PA-C 12/07/13 2150

## 2013-12-06 NOTE — Telephone Encounter (Signed)
Patient has contacted both our office and the [place] office multiple times today requesting to be given a 2nd chance and accepted back as a patient, and also stating she is going to file a lawsuit against [place] as well as contact the medical board.  I spoke to and notified Lattie Haw in risk management of threats made by patient.

## 2013-12-06 NOTE — Discharge Instructions (Signed)
Back Pain, Adult Low back pain is very common. About 1 in 5 people have back pain.The cause of low back pain is rarely dangerous. The pain often gets better over time.About half of people with a sudden onset of back pain feel better in just 2 weeks. About 8 in 10 people feel better by 6 weeks.  CAUSES Some common causes of back pain include:  Strain of the muscles or ligaments supporting the spine.  Wear and tear (degeneration) of the spinal discs.  Arthritis.  Direct injury to the back. DIAGNOSIS Most of the time, the direct cause of low back pain is not known.However, back pain can be treated effectively even when the exact cause of the pain is unknown.Answering your caregiver's questions about your overall health and symptoms is one of the most accurate ways to make sure the cause of your pain is not dangerous. If your caregiver needs more information, he or she may order lab work or imaging tests (X-rays or MRIs).However, even if imaging tests show changes in your back, this usually does not require surgery. HOME CARE INSTRUCTIONS For many people, back pain returns.Since low back pain is rarely dangerous, it is often a condition that people can learn to [facility] their own.   Remain active. It is stressful on the back to sit or stand in one place. Do not sit, drive, or stand in one place for more than 30 minutes at a time. Take short walks on level surfaces as soon as pain allows.Try to increase the length of time you walk each day.  Do not stay in bed.Resting more than 1 or 2 days can delay your recovery.  Do not avoid exercise or work.Your body is made to move.It is not dangerous to be active, even though your back may hurt.Your back will likely heal faster if you return to being active before your pain is gone.  Pay attention to your body when you bend and lift. Many people have less discomfortwhen lifting if they bend their knees, keep the load close to their bodies,and  avoid twisting. Often, the most comfortable positions are those that put less stress on your recovering back.  Find a comfortable position to sleep. Use a firm mattress and lie on your side with your knees slightly bent. If you lie on your back, put a pillow under your knees.  Only take over-the-counter or prescription medicines as directed by your caregiver. Over-the-counter medicines to reduce pain and inflammation are often the most helpful.Your caregiver may prescribe muscle relaxant drugs.These medicines help dull your pain so you can more quickly return to your normal activities and healthy exercise.  Put ice on the injured area.  Put ice in a plastic bag.  Place a towel between your skin and the bag.  Leave the ice on for 15-20 minutes, 03-04 times a day for the first 2 to 3 days. After that, ice and heat may be alternated to reduce pain and spasms.  Ask your caregiver about trying back exercises and gentle massage. This may be of some benefit.  Avoid feeling anxious or stressed.Stress increases muscle tension and can worsen back pain.It is important to recognize when you are anxious or stressed and learn ways to manage it.Exercise is a great option. SEEK MEDICAL CARE IF:  You have pain that is not relieved with rest or medicine.  You have pain that does not improve in 1 week.  You have new symptoms.  You are generally not feeling well. SEEK  IMMEDIATE MEDICAL CARE IF:   You have pain that radiates from your back into your legs.  You develop new bowel or bladder control problems.  You have unusual weakness or numbness in your arms or legs.  You develop nausea or vomiting.  You develop abdominal pain.  You feel faint. Document Released: 11/10/2005 Document Revised: 05/11/2012 Document Reviewed: 03/31/2011 [facility] Patient Information 2014 [place], [place].   Emergency Department Resource Guide 1) Find a Doctor and Pay Out of Pocket Although you won't have to find  out who is covered by your insurance plan, it is a good idea to ask around and get recommendations. You will then need to call the office and see if the doctor you have chosen will accept you as a new patient and what types of options they offer for patients who are self-pay. Some doctors offer discounts or will set up payment plans for their patients who do not have insurance, but you will need to ask so you aren't surprised when you get to your appointment.  2) Contact Your Local Health Department Not all health departments have doctors that can see patients for sick visits, but many do, so it is worth a call to see if yours does. If you don't know where your local health department is, you can check in your phone book. The CDC also has a tool to help you locate your state's health department, and many state websites also have listings of all of their local health departments.  3) Find a [facility] If your illness is not likely to be very severe or complicated, you may want to try a walk in clinic. These are popping up all over the country in pharmacies, drugstores, and shopping centers. They're usually staffed by nurse practitioners or physician assistants that have been trained to treat common illnesses and complaints. They're usually fairly quick and inexpensive. However, if you have serious medical issues or chronic medical problems, these are probably not your best option.  No Primary Care Doctor: - Call Health Connect at  [phone] - they can help you locate a primary care doctor that  accepts your insurance, provides certain services, etc. - Physician Referral Service- [phone]  Chronic Pain Problems: Organization         Address  Phone   Notes  [facility]  [phone] Patients need to be referred by their primary care doctor.   Medication Assistance: Organization         Address  Phone   Notes  [facility] Medication [facility] [place] [place], [place] [address] [phone] --Must be a resident of [facility] -- Must have NO insurance coverage whatsoever (no Medicaid/ Medicare, etc.) -- The pt. MUST have a primary care doctor that directs their care regularly and follows them in the community   MedAssist  [phone]   [employer]  [phone]    Agencies that provide inexpensive medical care: Organization         Address  Phone   Notes  [place]  [phone]   [facility]    [phone]   [facility] [place], [place] [address] [phone]   Paonia Carleton [phone]   Planned Parenthood    [phone]   [facility]    [phone]   Community Health and [facility]  [address], [place] Phone:  [phone], Fax:  [phone] Hours of Operation:  9 am - 6 pm, M-F.  Also accepts Medicaid/Medicare and self-pay.  [facility] for [place], [address], [place] Phone: [phone], Fax: [phone]. Hours of Operation:  8:30 am - 5:30 pm, M-F.  Also accepts Medicaid and self-pay.  [facility] [address], [place] Phone: [phone]   [place], [place], [place] [phone], Ext. [phone] Mondays & Thursdays: 7-9 AM.  First 15 patients are seen on a first come, first serve basis.    [facility] Providers:  Organization         Address  Phone   Notes  [facility] [address], [place] [phone] Also accepts self-pay patients.  [facility] [id number] [place], [place]  [phone]   [place], [address], [place] [phone]   [facility] [address], [place] [phone]   Lucianne Lei  [address], [place]   [phone] Only accepts [place] Access [place] patients after they have their name applied to their card.   Self-Pay (no insurance) in [facility]:  Organization         Address  Phone   Notes  Sickle Cell Patients, [facility] Internal Medicine [place] [phone]   [facility] Urgent Care [place] [phone]   [facility] [place]  [place], [place], [place] [phone]   [facility]/Dr. Osei-Bonsu  [address], [place] or [address], [place] [phone] Phone number for both [place] and [place] locations is the same.  Urgent Medical and [facility] [address] [place] [phone]   [facility] [address], [place] or [address] [phone]   [facility] [address] [place] [phone], phone; [phone], fax Sees patients 1st and 3rd Saturday of every month.  Must not qualify for public or private insurance (i.e. Medicaid, Medicare, [place] Health Choice, Veterans' Benefits)  Household income should be no more than 200% of the poverty level The clinic cannot treat you if you are pregnant or think you are pregnant  Sexually transmitted diseases are not treated at the clinic.    Dental Care: Organization         Address  Phone  Notes  [facility] Department of [facility] [place] [phone] Accepts children up to age 47 who are enrolled in [place] or [place]; pregnant women with a Medicaid card; and children who have applied for Medicaid or [place] Health Choice, but were declined, whose parents can pay a reduced fee at time of service.  [facility] Department of [facility]  [address], [place] [phone] Accepts children up to age 59  who are enrolled in [place] or [place]; pregnant women with a Medicaid card; and children who have applied for Medicaid or [place] Health Choice, but were declined, whose parents can pay a reduced fee at time of service.  [facility] ([facility]-Rh) Adult Dental Access PROGRAM  [place], [place] [phone]  Patients are seen by appointment only. Walk-ins are not accepted. [place] will see patients 90 years of age and older. Monday - Tuesday (8am-5pm) Most Wednesdays (8:30-5pm) $30 per visit, cash only  [facility] Adult Dental Access PROGRAM  [address], [facility] [phone] Patients are seen by appointment only. Walk-ins are not accepted. [place] will see patients 107 years of age and older. One Wednesday Evening (Monthly: Volunteer Based).  $30 per visit, cash only  [place]  [phone] for adults; Children under age 106, call Graduate Pediatric Dentistry at [phone]. Children aged 84-14, please call [phone] to request a pediatric application.  Dental services are provided in all areas of dental care including fillings, crowns and bridges, complete and partial dentures, implants, gum treatment, root canals, and extractions. Preventive care is also provided. Treatment is provided to both adults and children. Patients are selected via a lottery and there is often a waiting list.   [facility] [address], [place]  [phone] [email or web address]   [facility] [address] [place], [place] [phone], Ext. [phone] Second and Fourth Thursday of each month, opens at 6:30 AM; Clinic ends at 9 AM.  Patients are seen on a first-come first-served basis, and a limited number are seen during each clinic.   [facility]  [address] Hillard Danker [place], [place] [phone]   Eligibility Requirements You must have lived in [place], [place], or [place] counties for at least the last three months.    You cannot be eligible for state or federal sponsored [employer], including [employer], [place], or [employer].   You generally cannot be eligible for healthcare insurance through your employer.    How to apply: Eligibility screenings are held every Tuesday and Wednesday afternoon from 1:00 pm until 4:00 pm. You do not need an appointment for the interview!  [facility] [address], [place], [place]   [place] Department  [place]  [phone]    Behavioral Health Resources in the Community: Intensive Outpatient Programs Organization         Address  Phone  Notes  [place]. [address] [place], [place] [phone]   [facility]patient [address] [place], [place]   ADS: Alcohol & Drug Svcs [address], [place], [place]   [place] [address]  [employer], [place] or [phone]   Substance Abuse Resources Organization         Address  Phone  Notes  Alcohol and Drug Services  [phone]   [place]  7246752435   [facility] [place]   Chinita Pester  [phone]   Residential & Outpatient Substance Abuse Program  [phone]   Psychological Services Organization         Address  Phone  Notes  [facility] [place]  [place]  [phone]   [place] [address] [place] or [phone]    Mobile Crisis Teams Organization         Address  Phone  Notes  [facility], Mobile Crisis Care Unit  [phone]   [facility]  [address] [place], [place]   [facility] [address], [place] 18  [place] [phone]    Self-Help/Support  Groups Organization         Address  Phone             Notes  Mental Health Assoc. of [place] - variety of support groups  [place] Call for more information  Narcotics Anonymous (NA), [facility] [address], [employer] [place]  2 meetings at this location   Special educational needs teacher         Address  Phone  Notes  [facility]idential [facility] [place],    [place]  [phone]   [facility]  [address], [place] [address]263, [place], [place]   [place], [place] [phone] Admissions: 8am-3pm M-F  Incentives Substance [place] [address],    [place], [place] [phone]   [facility] [address] [place], [place], [place]   [facility] [address],  [place], [place]   [facility]ight Programs - Intensive Outpatient [address] Kristeen Mans [address], [place], [place]   [facility] [address].,  [place], [place] [phone] or [phone]   [facility] Services (RTS) [address] [place], [place] Accepts Medicaid  [facility] [place] [address],  [place] [phone] Substance Abuse/Addiction Treatment   [facility] Organization         Address  Phone  Notes  [facility]  [phone]   Domenic Schwab, PhD [address] Arlis Port[place], [place]   5753147007 or [phone]   [place], [place] [phone]   [facility] [address], [place], [place] [phone] Insurance/Medicaid/sponsorship through [facility] and Families [address] [place]                                    [place], [place] [phone] [place] [address], [place] [phone]    Dr. Adele Schilder  [phone]   [facility] Dept. 1) [address], [place] 2) [place] 3)  [place] [address], [place] [phone]  [phone]   [place] [phone] or [phone] (After Hours)

## 2013-12-06 NOTE — ED Notes (Addendum)
Pt presents to department for evaluation of back pain. Ongoing x6 years. States she is almost out of pain medications. 10/10 pain. Pt is alert and oriented x4. Was seen at [facility] last night for same, states "I left because they would not give me any pain medication."

## 2013-12-06 NOTE — Telephone Encounter (Signed)
Pt called office and requested to speak with office manager.  Pt states Dr. [name] lied by stating she tested positive for cocaine.  Patient admitted to me that she does smokes "a joint or two" but that she has not had one since this summer.  Patient however denies use of cocaine.  I advised patient that use of any illegal drugs is a direct violation of the controlled substance contract and because of that she will be discharged from the practice.  Pt was encouraged to find a new primary care physician immediately as she would only be treated for life threatening symptoms within the next 30 days.  Pt requested to speak with direct supervisor to discuss her concerns.  Contact information for Aaron Edelman was given to pt.

## 2013-12-06 NOTE — ED Provider Notes (Signed)
Medical screening examination/treatment/procedure(s) were performed by non-physician practitioner and as supervising physician I was immediately available for consultation/collaboration.  EKG Interpretation   None       Mehul Rudin, MD, FACEP   Dekota Kirlin L [facility], MD 12/06/13 0004 

## 2013-12-07 ENCOUNTER — Ambulatory Visit (INDEPENDENT_AMBULATORY_CARE_PROVIDER_SITE_OTHER): Payer: Self-pay | Admitting: Family Medicine

## 2013-12-07 VITALS — BP 90/60 | HR 69 | Temp 98.0°F | Resp 16 | Ht 62.0 in | Wt 183.0 lb

## 2013-12-07 DIAGNOSIS — M545 Low back pain, unspecified: Secondary | ICD-10-CM

## 2013-12-07 DIAGNOSIS — R825 Elevated urine levels of drugs, medicaments and biological substances: Secondary | ICD-10-CM

## 2013-12-07 DIAGNOSIS — G8929 Other chronic pain: Secondary | ICD-10-CM

## 2013-12-07 DIAGNOSIS — R892 Abnormal level of other drugs, medicaments and biological substances in specimens from other organs, systems and tissues: Secondary | ICD-10-CM

## 2013-12-07 MED ORDER — GABAPENTIN 100 MG PO CAPS: 100.0000 mg | ORAL_CAPSULE | Freq: Three times a day (TID) | ORAL | Status: DC
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
## 2013-12-07 NOTE — Patient Instructions (Signed)
We placed a referral for pain management. You can try the neurontin as discussed - up to 3 times per day for your pain, but we will no be able to provide narcotic pain medicine from our office. Be careful combining this medicine with any other sedating medicines, and if you do combine them, start with 1/2 of usual dose of other sedating medicine.  If any incontinence of urine or stool, leg weakness or numbness in groin - go immediately to the emergency.  Additionally if you feel like you are withdrawing from your medicine, proceed to the nearest emergency room. (Return to the clinic or go to the nearest emergency room if any of your symptoms worsen or new symptoms occur).   Back Pain, Adult Low back pain is very common. About 1 in 5 people have back pain.The cause of low back pain is rarely dangerous. The pain often gets better over time.About half of people with a sudden onset of back pain feel better in just 2 weeks. About 8 in 10 people feel better by 6 weeks.  CAUSES Some common causes of back pain include:  Strain of the muscles or ligaments supporting the spine.  Wear and tear (degeneration) of the spinal discs.  Arthritis.  Direct injury to the back. DIAGNOSIS Most of the time, the direct cause of low back pain is not known.However, back pain can be treated effectively even when the exact cause of the pain is unknown.Answering your caregiver's questions about your overall health and symptoms is one of the most accurate ways to make sure the cause of your pain is not dangerous. If your caregiver needs more information, he or she may order lab work or imaging tests (X-rays or MRIs).However, even if imaging tests show changes in your back, this usually does not require surgery. HOME CARE INSTRUCTIONS For many people, back pain returns.Since low back pain is rarely dangerous, it is often a condition that people can learn to [facility] their own.   Remain active. It is stressful on the back  to sit or stand in one place. Do not sit, drive, or stand in one place for more than 30 minutes at a time. Take short walks on level surfaces as soon as pain allows.Try to increase the length of time you walk each day.  Do not stay in bed.Resting more than 1 or 2 days can delay your recovery.  Do not avoid exercise or work.Your body is made to move.It is not dangerous to be active, even though your back may hurt.Your back will likely heal faster if you return to being active before your pain is gone.  Pay attention to your body when you bend and lift. Many people have less discomfortwhen lifting if they bend their knees, keep the load close to their bodies,and avoid twisting. Often, the most comfortable positions are those that put less stress on your recovering back.  Find a comfortable position to sleep. Use a firm mattress and lie on your side with your knees slightly bent. If you lie on your back, put a pillow under your knees.  Only take over-the-counter or prescription medicines as directed by your caregiver. Over-the-counter medicines to reduce pain and inflammation are often the most helpful.Your caregiver may prescribe muscle relaxant drugs.These medicines help dull your pain so you can more quickly return to your normal activities and healthy exercise.  Put ice on the injured area.  Put ice in a plastic bag.  Place a towel between your skin and  the bag.  Leave the ice on for 15-20 minutes, 03-04 times a day for the first 2 to 3 days. After that, ice and heat may be alternated to reduce pain and spasms.  Ask your caregiver about trying back exercises and gentle massage. This may be of some benefit.  Avoid feeling anxious or stressed.Stress increases muscle tension and can worsen back pain.It is important to recognize when you are anxious or stressed and learn ways to manage it.Exercise is a great option. SEEK MEDICAL CARE IF:  You have pain that is not relieved with  rest or medicine.  You have pain that does not improve in 1 week.  You have new symptoms.  You are generally not feeling well. SEEK IMMEDIATE MEDICAL CARE IF:   You have pain that radiates from your back into your legs.  You develop new bowel or bladder control problems.  You have unusual weakness or numbness in your arms or legs.  You develop nausea or vomiting.  You develop abdominal pain.  You feel faint. Document Released: 11/10/2005 Document Revised: 05/11/2012 Document Reviewed: 03/31/2011 [facility] Patient Information 2014 [place], [place].

## 2013-12-08 LAB — URINE CULTURE: Colony Count: 9000

## 2013-12-09 NOTE — Telephone Encounter (Signed)
We will not give her any more narcotics. I suggest she go to the ER

## 2013-12-09 NOTE — Telephone Encounter (Signed)
Pt would like another refill of percocet due to dt's. Pt has called several times

## 2013-12-10 NOTE — ED Provider Notes (Signed)
Medical screening examination/treatment/procedure(s) were performed by non-physician practitioner and as supervising physician I was immediately available for consultation/collaboration.  EKG Interpretation   None        [name], MD 12/10/13 2338

## 2013-12-22 ENCOUNTER — Encounter: Payer: Self-pay | Admitting: Family Medicine

## 2014-01-04 ENCOUNTER — Telehealth: Payer: Self-pay | Admitting: Family Medicine

## 2014-01-04 NOTE — Telephone Encounter (Signed)
Certified dismissal letter returned as undeliverable, unclaimed, return to sender after three attempts by [employer]. Letter placed in another envelope and resent as 1st class mail which does not require a signature. 01/04/14 °DAJ ° °

## 2014-03-27 ENCOUNTER — Telehealth: Payer: Self-pay | Admitting: Family Medicine

## 2014-03-27 NOTE — Telephone Encounter (Signed)
Dismissal letter returned to [employer] due to no forwarding address.  03/27/14 DAJ

## 2014-03-28 ENCOUNTER — Emergency Department (HOSPITAL_BASED_OUTPATIENT_CLINIC_OR_DEPARTMENT_OTHER)
Admission: EM | Admit: 2014-03-28 | Discharge: 2014-03-28 | Disposition: A | Discharge status: Home / Self Care | Payer: No Typology Code available for payment source | Attending: Emergency Medicine | Admitting: Emergency Medicine

## 2014-03-28 ENCOUNTER — Encounter (HOSPITAL_BASED_OUTPATIENT_CLINIC_OR_DEPARTMENT_OTHER): Payer: Self-pay | Admitting: Emergency Medicine

## 2014-03-28 DIAGNOSIS — Z8719 Personal history of other diseases of the digestive system: Secondary | ICD-10-CM | POA: Insufficient documentation

## 2014-03-28 DIAGNOSIS — F411 Generalized anxiety disorder: Secondary | ICD-10-CM | POA: Insufficient documentation

## 2014-03-28 DIAGNOSIS — F319 Bipolar disorder, unspecified: Secondary | ICD-10-CM | POA: Insufficient documentation

## 2014-03-28 DIAGNOSIS — Z87440 Personal history of urinary (tract) infections: Secondary | ICD-10-CM | POA: Insufficient documentation

## 2014-03-28 DIAGNOSIS — Z79899 Other long term (current) drug therapy: Secondary | ICD-10-CM | POA: Insufficient documentation

## 2014-03-28 DIAGNOSIS — G8929 Other chronic pain: Secondary | ICD-10-CM | POA: Insufficient documentation

## 2014-03-28 DIAGNOSIS — F17200 Nicotine dependence, unspecified, uncomplicated: Secondary | ICD-10-CM | POA: Insufficient documentation

## 2014-03-28 DIAGNOSIS — W268XXA Contact with other sharp object(s), not elsewhere classified, initial encounter: Secondary | ICD-10-CM | POA: Insufficient documentation

## 2014-03-28 DIAGNOSIS — Z880 Allergy status to penicillin: Secondary | ICD-10-CM | POA: Insufficient documentation

## 2014-03-28 DIAGNOSIS — S61210A Laceration without foreign body of right index finger without damage to nail, initial encounter: Secondary | ICD-10-CM

## 2014-03-28 DIAGNOSIS — S61209A Unspecified open wound of unspecified finger without damage to nail, initial encounter: Secondary | ICD-10-CM | POA: Insufficient documentation

## 2014-03-28 DIAGNOSIS — Y9289 Other specified places as the place of occurrence of the external cause: Secondary | ICD-10-CM | POA: Insufficient documentation

## 2014-03-28 DIAGNOSIS — Y9389 Activity, other specified: Secondary | ICD-10-CM | POA: Insufficient documentation

## 2014-03-28 MED ORDER — OXYCODONE-ACETAMINOPHEN 5-325 MG PO TABS
1.0000 | ORAL_TABLET | Freq: Once | ORAL | Status: AC
  Administered 2014-03-28: 1 via ORAL
  Filled 2014-03-28: qty 1

## 2014-03-28 MED ORDER — OXYCODONE-ACETAMINOPHEN 5-325 MG PO TABS: 1.0000 | ORAL_TABLET | ORAL | Status: DC | PRN

## 2014-03-28 NOTE — ED Notes (Addendum)
Pt c/o right index finger laceration x 2 hrs ago by broken glass

## 2014-03-28 NOTE — ED Provider Notes (Signed)
  This was a shared visit with a mid-level provided (NP or PA).  Throughout the patient's course I was available for consultation/collaboration.    On my exam the patient was in no distress.  The avulsion of skin on the dorsum of the finger did not leave any tissue available for suturing.  With persistent bleeding I applied topical hemostatic powder, which stopped the bleeding. Dressing was applied and the patient was d/c in stable condition.       Carmin Muskrat, MD 03/28/14 [phone]

## 2014-03-28 NOTE — ED Provider Notes (Signed)
CSN: [id number]     Arrival date & time 03/28/14  1413 History   First MD Initiated Contact with Patient 03/28/14 1443     Chief Complaint  Patient presents with  . Extremity Laceration     (Consider location/radiation/quality/duration/timing/severity/associated sxs/prior Treatment) Patient is a 43 y.o. female presenting with skin laceration. The history is provided by the patient.  Laceration Location:  Hand Hand laceration location:  R finger Length (cm):  1.5 Quality: avulsion   Bleeding: controlled   Time since incident:  1 hour  Lynn Wallace is a 43 y.o. female with an avulsion laceration to the right index finger. She was taking a piece of broken glass out of a window and scraped her finger on the broken glass. There was a piece of skin hanging on but she pulled it off. Bleeding controlled. She denies any other injuries. She is up to date on tetanus.   Past Medical History  Diagnosis Date  . Polysubstance abuse     including narcotics, cocaine, benzodiazepines,  and marijuana  . Psychiatric problem     multiple addmissons for detox  . 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 Chronic low back pain   . GERD (gastroesophageal reflux disease)   . Irritable bowel syndrome (IBS)   . Anxiety   . UTI (lower urinary tract infection)    Past Surgical History  Procedure Laterality Date  . Tubal ligation    . Dilation and curettage of uterus     Family History  Problem Relation Age of Onset  . Coronary artery disease      Female 1st degree relative <60  . Lung cancer    . Stroke      M 1st degree relative <50  . Arthritis Mother    History  Substance Use Topics  . Smoking status: Current Every Day Smoker    Types: Cigarettes  . Smokeless tobacco: Never Used     Comment: 1/2 pack or less  . Alcohol Use: No   OB History   Grav Para Term Preterm Abortions TAB SAB Ect Mult Living   7 5 5  0 2 1 1   5      Review of  Systems Negative except as stated in HPI   Allergies  Hydrocodone-acetaminophen; Ibuprofen; Penicillins; Cleocin; and Keflex  Home Medications   Prior to Admission medications   Medication Sig Start Date End Date Taking? Authorizing Provider  alprazolam Duanne Moron) 2 MG tablet Take 1 tablet (2 mg total) by mouth 4 (four) times daily as needed for anxiety. 11/21/13   Eulas Post, MD  FLUoxetine (PROZAC) 20 MG capsule Take 20 mg by mouth daily. 12/09/12   Laurey Morale, MD  gabapentin (NEURONTIN) 100 MG capsule Take 1 capsule (100 mg total) by mouth 3 (three) times daily. 12/07/13   [name], MD  oxyCODONE-acetaminophen (PERCOCET) 10-325 MG per tablet Take 1 tablet by mouth every 4 (four) hours as needed for pain. 11/21/13   Eulas Post, MD  oxyCODONE-acetaminophen (PERCOCET/ROXICET) 5-325 MG per tablet Take 1-2 tablets by mouth every 6 (six) hours as needed for severe pain. 12/05/13   Elwyn Lade, PA-C   BP 109/74  Pulse 67  Temp(Src) 98.2 F (36.8 C) (Oral)  Resp 16  Ht 5\' 2"  (1.575 m)  Wt 183 lb (83.008 kg)  BMI 33.46 kg/m2  SpO2 100%  LMP 10/05/2013 Physical Exam  Nursing note and vitals reviewed. Constitutional:  She is oriented to person, place, and time. She appears well-developed and well-nourished.  HENT:  Head: Normocephalic.  Eyes: EOM are normal.  Neck: Neck supple.  Pulmonary/Chest: Effort normal.  Abdominal: Soft. There is no tenderness.  Musculoskeletal: Normal range of motion.       Right hand: She exhibits tenderness and laceration. She exhibits normal range of motion, normal capillary refill and no deformity. Normal sensation noted. Normal strength noted.       Hands: Avulsion laceration to the dorsal aspect of the right index finger.   Neurological: She is alert and oriented to person, place, and time. No cranial nerve deficit.  Skin: Skin is warm and dry.  Psychiatric: She has a normal mood and affect. Her behavior is normal.    ED  Course: dr. Vanita Panda in to see the patient and applied quick clot to the wound. Dressing applied.   Procedures  I discussed with the patient x-ray of the finger and she states that there is no glass in the wound and it just scraped the top off and she does not want an x-ray. She does however need something for pain. Percocet given 5/325 PO MDM  43 y.o. female with avulsion laceration to the dorsal aspect of the right index finger. Discussed with the patient plan of care and all questioned fully answered. She will return if any problems arise. Stable for discharge and remains neurovascularly intact.     [place], [place] 03/28/14 [phone]

## 2014-04-08 ENCOUNTER — Emergency Department (HOSPITAL_COMMUNITY)
Admission: EM | Admit: 2014-04-08 | Discharge: 2014-04-08 | Disposition: A | Discharge status: Home / Self Care | Payer: No Typology Code available for payment source | Attending: Emergency Medicine | Admitting: Emergency Medicine

## 2014-04-08 ENCOUNTER — Encounter (HOSPITAL_COMMUNITY): Payer: Self-pay | Admitting: Emergency Medicine

## 2014-04-08 ENCOUNTER — Telehealth (HOSPITAL_BASED_OUTPATIENT_CLINIC_OR_DEPARTMENT_OTHER): Payer: Self-pay | Admitting: *Deleted

## 2014-04-08 DIAGNOSIS — R52 Pain, unspecified: Secondary | ICD-10-CM | POA: Insufficient documentation

## 2014-04-08 DIAGNOSIS — F411 Generalized anxiety disorder: Secondary | ICD-10-CM | POA: Insufficient documentation

## 2014-04-08 DIAGNOSIS — M549 Dorsalgia, unspecified: Secondary | ICD-10-CM

## 2014-04-08 DIAGNOSIS — Z791 Long term (current) use of non-steroidal anti-inflammatories (NSAID): Secondary | ICD-10-CM | POA: Insufficient documentation

## 2014-04-08 DIAGNOSIS — M545 Low back pain, unspecified: Secondary | ICD-10-CM | POA: Insufficient documentation

## 2014-04-08 DIAGNOSIS — Z792 Long term (current) use of antibiotics: Secondary | ICD-10-CM | POA: Insufficient documentation

## 2014-04-08 DIAGNOSIS — Z8719 Personal history of other diseases of the digestive system: Secondary | ICD-10-CM | POA: Insufficient documentation

## 2014-04-08 DIAGNOSIS — F17200 Nicotine dependence, unspecified, uncomplicated: Secondary | ICD-10-CM | POA: Insufficient documentation

## 2014-04-08 DIAGNOSIS — F319 Bipolar disorder, unspecified: Secondary | ICD-10-CM | POA: Insufficient documentation

## 2014-04-08 DIAGNOSIS — L0390 Cellulitis, unspecified: Secondary | ICD-10-CM

## 2014-04-08 DIAGNOSIS — Z880 Allergy status to penicillin: Secondary | ICD-10-CM | POA: Insufficient documentation

## 2014-04-08 DIAGNOSIS — G8929 Other chronic pain: Secondary | ICD-10-CM | POA: Insufficient documentation

## 2014-04-08 DIAGNOSIS — IMO0002 Reserved for concepts with insufficient information to code with codable children: Secondary | ICD-10-CM | POA: Insufficient documentation

## 2014-04-08 DIAGNOSIS — Z87440 Personal history of urinary (tract) infections: Secondary | ICD-10-CM | POA: Insufficient documentation

## 2014-04-08 MED ORDER — DOXYCYCLINE HYCLATE 100 MG PO CAPS: 100.0000 mg | ORAL_CAPSULE | Freq: Two times a day (BID) | ORAL | Status: DC

## 2014-04-08 MED ORDER — HYDROMORPHONE HCL PF 1 MG/ML IJ SOLN
1.0000 mg | Freq: Once | INTRAMUSCULAR | Status: AC
  Administered 2014-04-08: 1 mg via INTRAMUSCULAR
  Filled 2014-04-08: qty 1

## 2014-04-08 MED ORDER — OXYCODONE-ACETAMINOPHEN 5-325 MG PO TABS: 1.0000 | ORAL_TABLET | Freq: Four times a day (QID) | ORAL | Status: DC | PRN

## 2014-04-08 NOTE — Telephone Encounter (Signed)
Pt at [facility] trying to fill just percocet, pharmacists called because she is aware of our policy not to fill pain med without the antibiotics.  MD Arvid Right she needs to fill Doxycycline if she wants the Percocet.  Pharmacists notified.

## 2014-04-08 NOTE — ED Notes (Addendum)
Pt reports that she hurt her first finger on her R hand x2 weeks ago on a broken window. Pt states that medication was used to help the bleeding clot "from the [place]." Pt reports that she had a rash begin to her R upper arm after that x2 weeks ago, used [employer]	[place] and rash resolved. However, over the same area, she has been having pain redness and swelling to her R upper arm x1 week. Pt also reports she has chronic back pain and wants a referral to the pain clinic.  Pt a&o x4, skin warm and dry, and ambulatory to triage.

## 2014-04-08 NOTE — Discharge Instructions (Signed)
As discussed, your evaluation today has been largely reassuring.  But, it is important that you monitor your condition carefully, and do not hesitate to return to the ED if you develop new, or concerning changes in your condition.  Please be sure to follow up with your new primary care physician in the pain management clinic.

## 2014-04-08 NOTE — ED Notes (Signed)
Patient requests referral to pain management clinic here at [facility] instead of in [place]. MD Vanita Panda aware.

## 2014-04-08 NOTE — ED Provider Notes (Addendum)
CSN: [id number]     Arrival date & time 04/08/14  1937 History   First MD Initiated Contact with Patient 04/08/14 2018     Chief Complaint  Patient presents with  . Arm Pain     (Consider location/radiation/quality/duration/timing/severity/associated sxs/prior Treatment) HPI Patient presents with concern of back pain, cutaneous lesion on the right proximal arm. Patient was evaluated by me 11 days ago after she avulsed the distal aspect of the digit on the right hand.  This injury is healing well, she has no complaints at it, though there is continued throbbing in the area, no loss of function, no loss of strength in the digit or hand. Patient actually states that prior to the injury she had erythema in the right proximal medial upper arm.  It was minimal, and she did not mention it during her previous evaluation for injury. She states that recently this wound has been, larger, sore, itchy, with progression towards her thorax. No new fever, chills.  The patient's glans of ongoing low back pain, unchanged from prior, with no new incontinence, falls, distal weakness. Patient has not seen her physician in some time, and when asked about prior pain management contract, she states that she had a disagreement with a physician in the distant past, and that agreement was not applicable.   Past Medical History  Diagnosis Date  . Polysubstance abuse     including narcotics, cocaine, benzodiazepines,  and marijuana  . Psychiatric problem     multiple addmissons for detox  . 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 Chronic low back pain   . GERD (gastroesophageal reflux disease)   . Irritable bowel syndrome (IBS)   . Anxiety   . UTI (lower urinary tract infection)    Past Surgical History  Procedure Laterality Date  . Tubal ligation    . Dilation and curettage of uterus     Family History  Problem Relation Age of Onset  . Coronary artery disease      Female 1st  degree relative <60  . Lung cancer    . Stroke      M 1st degree relative <50  . Arthritis Mother    History  Substance Use Topics  . Smoking status: Current Every Day Smoker    Types: Cigarettes  . Smokeless tobacco: Never Used     Comment: 1/2 pack or less  . Alcohol Use: No   OB History   Grav Para Term Preterm Abortions TAB SAB Ect Mult Living   7 5 5  0 2 1 1   5      Review of Systems  Constitutional:       Per HPI, otherwise negative  HENT:       Per HPI, otherwise negative  Respiratory:       Per HPI, otherwise negative  Cardiovascular:       Per HPI, otherwise negative  Gastrointestinal: Negative for vomiting.  Endocrine:       Negative aside from HPI  Genitourinary:       Neg aside from HPI   Musculoskeletal:       Per HPI, otherwise negative  Skin: Negative.   Neurological: Negative for syncope.      Allergies  Hydrocodone-acetaminophen; Ibuprofen; Penicillins; Cleocin; and Keflex  Home Medications   Prior to Admission medications   Medication Sig Start Date End Date Taking? Authorizing Provider  acetaminophen (TYLENOL) 500 MG tablet Take 500 mg by mouth every  6 (six) hours as needed.   Yes Historical Provider, MD  ibuprofen (ADVIL,MOTRIN) 200 MG tablet Take 200 mg by mouth every 6 (six) hours as needed.   Yes Historical Provider, MD  naproxen sodium (ANAPROX) 220 MG tablet Take 220 mg by mouth 2 (two) times daily with a meal.   Yes Historical Provider, MD  PRESCRIPTION MEDICATION Take 1 tablet by mouth daily. For bipolar disorder.   Yes Historical Provider, MD  QUEtiapine Fumarate (SEROQUEL PO) Take 1 tablet by mouth at bedtime.   Yes Historical Provider, MD  doxycycline (VIBRAMYCIN) 100 MG capsule Take 1 capsule (100 mg total) by mouth 2 (two) times daily. 04/08/14   Carmin Muskrat, MD  oxyCODONE-acetaminophen (PERCOCET/ROXICET) 5-325 MG per tablet Take 1-2 tablets by mouth every 6 (six) hours as needed for severe pain. 04/08/14   Carmin Muskrat, MD    BP 96/73  Pulse 89  Temp(Src) 98 F (36.7 C) (Oral)  Resp 20  Wt 185 lb (83.915 kg)  SpO2 98%  LMP 10/05/2013 Physical Exam  Nursing note and vitals reviewed. Constitutional: She is oriented to person, place, and time. She appears well-developed and well-nourished. No distress.  HENT:  Head: Normocephalic and atraumatic.  Eyes: Conjunctivae and EOM are normal.  Cardiovascular: Normal rate and regular rhythm.   Pulmonary/Chest: Effort normal and breath sounds normal. No stridor. No respiratory distress.  Abdominal: She exhibits no distension.  Musculoskeletal: She exhibits no edema.  No other gross deformities, patient moves all extremities spontaneously, has no loss of strength  Neurological: She is alert and oriented to person, place, and time. No cranial nerve deficit.  Skin: Skin is warm and dry.     Psychiatric: She has a normal mood and affect.    ED Course  Procedures (including critical care time) After initial evaluation I reviewed the patient's electronic medical record, and we discussed her pain management options, and she will be discharged to follow up with pain management clinic.  MDM   Final diagnoses:  Back pain  Cellulitis    Patient presents with multiple concerns, is found to have region concerning for cellulitis, as well as acute on chronic back pain.  Additional labs, imaging not indicated.  Patient was discharged in stable condition after having lengthy conversation with her and her husband about appropriate home care, pain management.    Carmin Muskrat, MD 04/08/14 2051  UPDATE: After d/c we received a call from pharmacy because the patient was trying to obtain the prescribed narcotic, without filling the prescribed antibiotic concurrently.  She stated that she has an allergy to the antibiotic, though none was in the EMR.  When I advised that she can return here for reconsideration of an alternative antibiotic, she declined.  The pharmacist also  voiced concern about possible drug-seeking behavior.  I shared that I have this concern as well, and that the patient has been referred to pain management clinic for this reason.   Carmin Muskrat, MD 04/08/14 [phone]

## 2014-04-11 ENCOUNTER — Encounter (HOSPITAL_BASED_OUTPATIENT_CLINIC_OR_DEPARTMENT_OTHER): Payer: Self-pay | Admitting: Emergency Medicine

## 2014-04-11 DIAGNOSIS — G8929 Other chronic pain: Secondary | ICD-10-CM | POA: Insufficient documentation

## 2014-04-11 DIAGNOSIS — M79609 Pain in unspecified limb: Secondary | ICD-10-CM | POA: Insufficient documentation

## 2014-04-11 DIAGNOSIS — F17200 Nicotine dependence, unspecified, uncomplicated: Secondary | ICD-10-CM | POA: Insufficient documentation

## 2014-04-11 NOTE — ED Notes (Signed)
Pt reports right arm pain from shoulder to hand.  Pt reports that she was seen at [facility] and they did not do any xrays but prescribed antibiotic.  Family reports it started out looking like a rash.  Pt also reports did lift a heavy air conditioner.

## 2014-04-12 ENCOUNTER — Emergency Department (HOSPITAL_BASED_OUTPATIENT_CLINIC_OR_DEPARTMENT_OTHER)
Admission: EM | Admit: 2014-04-12 | Discharge: 2014-04-12 | Discharge status: Against Medical Advice | Payer: No Typology Code available for payment source | Attending: Emergency Medicine | Admitting: Emergency Medicine

## 2014-04-12 ENCOUNTER — Encounter (HOSPITAL_COMMUNITY): Payer: Self-pay | Admitting: Emergency Medicine

## 2014-04-12 ENCOUNTER — Emergency Department (HOSPITAL_COMMUNITY)
Admission: EM | Admit: 2014-04-12 | Discharge: 2014-04-12 | Disposition: A | Discharge status: Home / Self Care | Payer: No Typology Code available for payment source | Attending: Emergency Medicine | Admitting: Emergency Medicine

## 2014-04-12 DIAGNOSIS — Z87440 Personal history of urinary (tract) infections: Secondary | ICD-10-CM | POA: Insufficient documentation

## 2014-04-12 DIAGNOSIS — Z792 Long term (current) use of antibiotics: Secondary | ICD-10-CM | POA: Insufficient documentation

## 2014-04-12 DIAGNOSIS — Z791 Long term (current) use of non-steroidal anti-inflammatories (NSAID): Secondary | ICD-10-CM | POA: Insufficient documentation

## 2014-04-12 DIAGNOSIS — I889 Nonspecific lymphadenitis, unspecified: Secondary | ICD-10-CM | POA: Insufficient documentation

## 2014-04-12 DIAGNOSIS — F17200 Nicotine dependence, unspecified, uncomplicated: Secondary | ICD-10-CM | POA: Insufficient documentation

## 2014-04-12 DIAGNOSIS — R509 Fever, unspecified: Secondary | ICD-10-CM | POA: Insufficient documentation

## 2014-04-12 DIAGNOSIS — R21 Rash and other nonspecific skin eruption: Secondary | ICD-10-CM

## 2014-04-12 DIAGNOSIS — F411 Generalized anxiety disorder: Secondary | ICD-10-CM | POA: Insufficient documentation

## 2014-04-12 DIAGNOSIS — M79603 Pain in arm, unspecified: Secondary | ICD-10-CM

## 2014-04-12 DIAGNOSIS — Z880 Allergy status to penicillin: Secondary | ICD-10-CM | POA: Insufficient documentation

## 2014-04-12 DIAGNOSIS — Z8719 Personal history of other diseases of the digestive system: Secondary | ICD-10-CM | POA: Insufficient documentation

## 2014-04-12 DIAGNOSIS — G8929 Other chronic pain: Secondary | ICD-10-CM | POA: Insufficient documentation

## 2014-04-12 DIAGNOSIS — F319 Bipolar disorder, unspecified: Secondary | ICD-10-CM | POA: Insufficient documentation

## 2014-04-12 MED ORDER — TRAMADOL HCL 50 MG PO TABS: 50.0000 mg | ORAL_TABLET | Freq: Two times a day (BID) | ORAL | Status: DC

## 2014-04-12 MED ORDER — TRAMADOL HCL 50 MG PO TABS
50.0000 mg | ORAL_TABLET | Freq: Once | ORAL | Status: AC
  Administered 2014-04-12: 50 mg via ORAL
  Filled 2014-04-12: qty 1

## 2014-04-12 MED ORDER — SULFAMETHOXAZOLE-TRIMETHOPRIM 800-160 MG PO TABS: 1.0000 | ORAL_TABLET | Freq: Two times a day (BID) | ORAL | Status: DC

## 2014-04-12 MED ORDER — SULFAMETHOXAZOLE-TMP DS 800-160 MG PO TABS
1.0000 | ORAL_TABLET | Freq: Once | ORAL | Status: AC
  Administered 2014-04-12: 1 via ORAL
  Filled 2014-04-12: qty 1

## 2014-04-12 NOTE — ED Notes (Signed)
Pt reports pain to her right arm, but states that she is not here for pain medications, she just wants to make sure her arm is not broke.

## 2014-04-12 NOTE — ED Notes (Signed)
Pt c/o R elbow pain x 1.5 to 2 weeks. Pt states she feels it came from sleeping under air conditioner.

## 2014-04-12 NOTE — ED Notes (Signed)
Pt not in lobby when called, pt out in car per registration

## 2014-04-12 NOTE — ED Notes (Signed)
Pt left ama stating that all of the staff was rude and disrespectful to her. Pt was checked on multiple times, warm blankets were provided, pt refused assesment by md and walked out ama

## 2014-04-12 NOTE — ED Provider Notes (Signed)
Medical screening examination/treatment/procedure(s) were performed by non-physician practitioner and as supervising physician I was immediately available for consultation/collaboration.   EKG Interpretation None        [place], DO 04/12/14 2321

## 2014-04-12 NOTE — ED Notes (Signed)
MD at bedside. 

## 2014-04-12 NOTE — ED Provider Notes (Signed)
CSN: [id number]     Arrival date & time 04/12/14  2052 History   This chart was scribed for non-physician practitioner Junius Creamer, NP,  working with Delice Bison Ward, DO, by Neta Ehlers, ED Scribe. This patient was seen in room WTR6/WTR6 and the patient's care was started at 9:36 PM.  First MD Initiated Contact with Patient 04/12/14 2122     Chief Complaint  Patient presents with  . Arm Pain    HPI HPI Comments: Lynn Wallace is a 43 y.o. Female, with a h/o polysubstance abuse, bipolar disorder, and chronic pain, who presents to the Emergency Department complaining of a rash to her right forearm which has been present for approximately a week. She states pain to the forearm as well.  The pt reports a subjective fever; in the ED her temperature is 97.8 F. The pt was treated last week for similar symptoms by Dr. Vanita Panda and prescribed five percocets. She reports an appointment in two weeks, on June 3rd, with her PCP; she states she will request non-narcotic pain medication from her PCP but she also states no relief from NSAIDs.   Past Medical History  Diagnosis Date  . Polysubstance abuse     including narcotics, cocaine, benzodiazepines,  and marijuana  . Psychiatric problem     multiple addmissons for detox  . 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 Chronic low back pain   . GERD (gastroesophageal reflux disease)   . Irritable bowel syndrome (IBS)   . Anxiety   . UTI (lower urinary tract infection)    Past Surgical History  Procedure Laterality Date  . Tubal ligation    . Dilation and curettage of uterus     Family History  Problem Relation Age of Onset  . Coronary artery disease      Female 1st degree relative <60  . Lung cancer    . Stroke      M 1st degree relative <50  . Arthritis Mother    History  Substance Use Topics  . Smoking status: Current Every Day Smoker    Types: Cigarettes  . Smokeless tobacco: Never Used      Comment: 1/2 pack or less  . Alcohol Use: No   OB History   Grav Para Term Preterm Abortions TAB SAB Ect Mult Living   7 5 5  0 2 1 1   5      Review of Systems  Constitutional: Positive for fever (Subjective ).  Musculoskeletal: Positive for myalgias.  Skin: Positive for rash.  All other systems reviewed and are negative.     Allergies  Doxycycline; Hydrocodone-acetaminophen; Ibuprofen; Penicillins; Cleocin; and Keflex  Home Medications   Prior to Admission medications   Medication Sig Start Date End Date Taking? Authorizing Provider  acetaminophen (TYLENOL) 500 MG tablet Take 500 mg by mouth every 6 (six) hours as needed.    Historical Provider, MD  doxycycline (VIBRAMYCIN) 100 MG capsule Take 1 capsule (100 mg total) by mouth 2 (two) times daily. 04/08/14   Carmin Muskrat, MD  ibuprofen (ADVIL,MOTRIN) 200 MG tablet Take 200 mg by mouth every 6 (six) hours as needed.    Historical Provider, MD  naproxen sodium (ANAPROX) 220 MG tablet Take 220 mg by mouth 2 (two) times daily with a meal.    Historical Provider, MD  oxyCODONE-acetaminophen (PERCOCET/ROXICET) 5-325 MG per tablet Take 1-2 tablets by mouth every 6 (six) hours as needed for severe pain. 04/08/14  Carmin Muskrat, MD  PRESCRIPTION MEDICATION Take 1 tablet by mouth daily. For bipolar disorder.    Historical Provider, MD  QUEtiapine Fumarate (SEROQUEL PO) Take 1 tablet by mouth at bedtime.    Historical Provider, MD   Triage Vitals: BP 106/70  Pulse 81  Temp(Src) 97.8 F (36.6 C) (Oral)  Resp 16  Ht 5\' 2"  (1.575 m)  Wt 185 lb (83.915 kg)  BMI 33.83 kg/m2  SpO2 95%  LMP 10/05/2013  Physical Exam  Nursing note and vitals reviewed. Constitutional: She is oriented to person, place, and time. She appears well-developed and well-nourished. No distress.  HENT:  Head: Normocephalic and atraumatic.  Eyes: EOM are normal.  Neck: Neck supple. No tracheal deviation present.  Cardiovascular: Normal rate.    Pulmonary/Chest: Effort normal. No respiratory distress.  Musculoskeletal: Normal range of motion.  Lymphadenopathy:    She has axillary adenopathy.       Right axillary: Pectoral adenopathy present.       Right: Epitrochlear adenopathy present.  Neurological: She is alert and oriented to person, place, and time.  Skin: Skin is warm and dry. Rash noted.  Superficial rash to medial right upper arm.   Psychiatric: She has a normal mood and affect. Her behavior is normal.    ED Course  Procedures (including critical care time)  DIAGNOSTIC STUDIES: Oxygen Saturation is 95% on room air, normal by my interpretation.    COORDINATION OF CARE:  9:40 PM- Discussed treatment plan with patient, and the patient agreed to the plan.   Labs Review Labs Reviewed - No data to display  Imaging Review No results found.   EKG Interpretation None      MDM  Due to ongoing, rash, and lymphadenopathy.  Antibiotic as dense started.  I discussed with pharmacist to to patient's numerous antibiotic allergies and Septra.  Has been recommended as the past for superficial skin infections and also within the patient's cost.  Parameters Final diagnoses:  Rash  Lymphadenitis       I personally performed the services described in this documentation, which was scribed in my presence. The recorded information has been reviewed and is accurate.    Garald Balding, NP 04/12/14 2207  Garald Balding, NP 04/12/14 2220

## 2014-04-12 NOTE — ED Notes (Signed)
Warm blakets provided, pt advised that there were no remotes in the room, offered to change tv channel for patient but she decided to do it herself. Talked with patient in room about arm, no redness or warm noted to extremity, pt denied any other needs at this time

## 2014-04-12 NOTE — ED Notes (Signed)
Pt has a ride home.  

## 2014-04-12 NOTE — Discharge Instructions (Signed)
Please take the antibiotic as directed until, completed.  Make, sure to keep your appointment with your primary care Dr. on June 3 as scheduled

## 2014-04-12 NOTE — ED Provider Notes (Addendum)
EDP went to the room to see the patient who complained about chronic pain and wait time and husband's diabetes.  EDP attempted to take history but patient reported she wanted to complain about her husband's diabetes and wait time and her back pain.   EDP politely excused self from room to take call and stated I would return to perform history and physical exam.  The patient eloped without history or exam being completed while taking call without ability to complete history or physical  Jaanai Salemi K Leta Bucklin-Rasch, MD 04/12/14 0111  Katelen Luepke K Annabeth Tortora-Rasch, MD 04/12/14 9735

## 2014-04-28 ENCOUNTER — Emergency Department (HOSPITAL_COMMUNITY)
Admission: EM | Admit: 2014-04-28 | Discharge: 2014-04-28 | Disposition: A | Discharge status: Home / Self Care | Payer: No Typology Code available for payment source | Attending: Emergency Medicine | Admitting: Emergency Medicine

## 2014-04-28 ENCOUNTER — Encounter (HOSPITAL_COMMUNITY): Payer: Self-pay | Admitting: Emergency Medicine

## 2014-04-28 DIAGNOSIS — F319 Bipolar disorder, unspecified: Secondary | ICD-10-CM | POA: Insufficient documentation

## 2014-04-28 DIAGNOSIS — M545 Low back pain, unspecified: Secondary | ICD-10-CM | POA: Insufficient documentation

## 2014-04-28 DIAGNOSIS — F411 Generalized anxiety disorder: Secondary | ICD-10-CM | POA: Insufficient documentation

## 2014-04-28 DIAGNOSIS — M549 Dorsalgia, unspecified: Secondary | ICD-10-CM

## 2014-04-28 DIAGNOSIS — F17200 Nicotine dependence, unspecified, uncomplicated: Secondary | ICD-10-CM | POA: Insufficient documentation

## 2014-04-28 DIAGNOSIS — K0889 Other specified disorders of teeth and supporting structures: Secondary | ICD-10-CM

## 2014-04-28 DIAGNOSIS — Z87440 Personal history of urinary (tract) infections: Secondary | ICD-10-CM | POA: Insufficient documentation

## 2014-04-28 DIAGNOSIS — Z880 Allergy status to penicillin: Secondary | ICD-10-CM | POA: Insufficient documentation

## 2014-04-28 DIAGNOSIS — G8929 Other chronic pain: Secondary | ICD-10-CM | POA: Insufficient documentation

## 2014-04-28 DIAGNOSIS — Z79899 Other long term (current) drug therapy: Secondary | ICD-10-CM | POA: Insufficient documentation

## 2014-04-28 DIAGNOSIS — Z792 Long term (current) use of antibiotics: Secondary | ICD-10-CM | POA: Insufficient documentation

## 2014-04-28 DIAGNOSIS — K089 Disorder of teeth and supporting structures, unspecified: Secondary | ICD-10-CM | POA: Insufficient documentation

## 2014-04-28 DIAGNOSIS — Z791 Long term (current) use of non-steroidal anti-inflammatories (NSAID): Secondary | ICD-10-CM | POA: Insufficient documentation

## 2014-04-28 MED ORDER — ONDANSETRON 4 MG PO TBDP
4.0000 mg | ORAL_TABLET | Freq: Once | ORAL | Status: AC
  Administered 2014-04-28: 4 mg via ORAL
  Filled 2014-04-28: qty 1

## 2014-04-28 MED ORDER — TRAMADOL HCL 50 MG PO TABS: 50.0000 mg | ORAL_TABLET | Freq: Four times a day (QID) | ORAL | Status: DC | PRN

## 2014-04-28 MED ORDER — OXYCODONE-ACETAMINOPHEN 5-325 MG PO TABS
2.0000 | ORAL_TABLET | Freq: Once | ORAL | Status: AC
  Administered 2014-04-28: 2 via ORAL
  Filled 2014-04-28: qty 2

## 2014-04-28 MED ORDER — AZITHROMYCIN 250 MG PO TABS: 250.0000 mg | ORAL_TABLET | Freq: Every day | ORAL | Status: DC

## 2014-04-28 NOTE — Progress Notes (Signed)
  CARE MANAGEMENT ED NOTE 04/28/2014  Patient:  NALIYA, GISH   Account Number:  [id number]  Date Initiated:  04/28/2014  Documentation initiated by:  Jackelyn Poling  Subjective/Objective Assessment:   43 yr old self pay pt with [facility], no admissions in last 6 months c/o bottom left tooth chipped off 3 weeks ago, pain 10/10. Also reports chronic back pain, lost her pcp, wants referral to a pain clinic through [place].     Subjective/Objective Assessment Detail:   Seen by [facility] staff at [facility] who confirmed pt orange card is not valid because she does not truly live in [facility] She lives in [place] as confirmed by her & female at bedside when ED CM inquired.  [facility] staff confirmed this also with pt & made a copy of card.  Pt informed CM she was informed by "A woman at [facility] told me to come here to get a referral to a back doctor on the orange card program."  Pt confirms she called the orthopedic provider # listed on the orange card & all the people called states they do not see orange card patients Pt told CM she did not want [place] resources. Female at bedside voiced understanding of need for pt to have a pcp to refer her to an orthopedic provider but it will not be a [facility] specialist, therefore she would have to pay out of pocket for services Pt showed CM her orange card  Pt raising her voice & verbally aggressive throughout CM interaction with her She walked out of the room x 1, pacing Told Female at bedside to stop telling "our business" He told her if she continued to yell at him he would "go to the car"  Dr Wandra Scot fired pt from [place] service     Action/Plan:   ED CM consult by [facility] staff to offer pt resources for [facility] r/t invalid orange card.  Cm spoke with pt & female at bedside about [place] pcp resources. Cm discussed invalid orange card, need to be a [employer] resident to   Action/Plan Detail:   access the program.  Explained that an ED  referral may be given but that does not guarantee the dr will not request she pay prior to services.  Explained to her that specialists can choose to & not to see a pt even an orange card pt   Anticipated DC Date:  04/28/2014     Status Recommendation to Physician:   Result of Recommendation:    Other ED Services  Consult [facility] [place]  Other  Outpatient Services - Pt will follow up  PCP issues    Choice offered to / List presented to:            Status of service:  Completed, signed off  ED Comments:  Pt was given written [place] resources for the free clinic and a few self pay pcp providers.  Pt states the address ("my momma's address") in [place] is only where she has her mail delivered Informed by ED PA/NP that pt voilated her pain contract  ED Comments Detail:

## 2014-04-28 NOTE — Progress Notes (Addendum)
[facility] CL spoke with patient about [employer]. Patient verified EPIC address as [address] [place], [place]. Explained to patient that [place] was now void because patient does not live in [place]. Patient stated that she receives her mail in [place]. Explained to patient that she must physically live in [place]. Consulted with [facility] ED CM Maudie Mercury, for resources in [place].

## 2014-04-28 NOTE — Discharge Instructions (Signed)
Dental Pain A tooth ache may be caused by cavities (tooth decay). Cavities expose the nerve of the tooth to air and hot or cold temperatures. It may come from an infection or abscess (also called a boil or furuncle) around your tooth. It is also often caused by dental caries (tooth decay). This causes the pain you are having. DIAGNOSIS  Your caregiver can diagnose this problem by exam. TREATMENT   If caused by an infection, it may be treated with medications which kill germs (antibiotics) and pain medications as prescribed by your caregiver. Take medications as directed.  Only take over-the-counter or prescription medicines for pain, discomfort, or fever as directed by your caregiver.  Whether the tooth ache today is caused by infection or dental disease, you should see your dentist as soon as possible for further care. SEEK MEDICAL CARE IF: The exam and treatment you received today has been provided on an emergency basis only. This is not a substitute for complete medical or dental care. If your problem worsens or new problems (symptoms) appear, and you are unable to meet with your dentist, call or return to this location. SEEK IMMEDIATE MEDICAL CARE IF:   You have a fever.  You develop redness and swelling of your face, jaw, or neck.  You are unable to open your mouth.  You have severe pain uncontrolled by pain medicine. MAKE SURE YOU:   Understand these instructions.  Will watch your condition.  Will get help right away if you are not doing well or get worse. Document Released: 11/10/2005 Document Revised: 02/02/2012 Document Reviewed: 06/28/2008 [employer] Patient Information 2014 [place]. Back Pain, Adult Low back pain is very common. About 1 in 5 people have back pain.The cause of low back pain is rarely dangerous. The pain often gets better over time.About half of people with a sudden onset of back pain feel better in just 2 weeks. About 8 in 10 people feel better by 6  weeks.  CAUSES Some common causes of back pain include:  Strain of the muscles or ligaments supporting the spine.  Wear and tear (degeneration) of the spinal discs.  Arthritis.  Direct injury to the back. DIAGNOSIS Most of the time, the direct cause of low back pain is not known.However, back pain can be treated effectively even when the exact cause of the pain is unknown.Answering your caregiver's questions about your overall health and symptoms is one of the most accurate ways to make sure the cause of your pain is not dangerous. If your caregiver needs more information, he or she may order lab work or imaging tests (X-rays or MRIs).However, even if imaging tests show changes in your back, this usually does not require surgery. HOME CARE INSTRUCTIONS For many people, back pain returns.Since low back pain is rarely dangerous, it is often a condition that people can learn to [facility] their own.   Remain active. It is stressful on the back to sit or stand in one place. Do not sit, drive, or stand in one place for more than 30 minutes at a time. Take short walks on level surfaces as soon as pain allows.Try to increase the length of time you walk each day.  Do not stay in bed.Resting more than 1 or 2 days can delay your recovery.  Do not avoid exercise or work.Your body is made to move.It is not dangerous to be active, even though your back may hurt.Your back will likely heal faster if you return to being active  before your pain is gone.  Pay attention to your body when you bend and lift. Many people have less discomfortwhen lifting if they bend their knees, keep the load close to their bodies,and avoid twisting. Often, the most comfortable positions are those that put less stress on your recovering back.  Find a comfortable position to sleep. Use a firm mattress and lie on your side with your knees slightly bent. If you lie on your back, put a pillow under your knees.  Only take  over-the-counter or prescription medicines as directed by your caregiver. Over-the-counter medicines to reduce pain and inflammation are often the most helpful.Your caregiver may prescribe muscle relaxant drugs.These medicines help dull your pain so you can more quickly return to your normal activities and healthy exercise.  Put ice on the injured area.  Put ice in a plastic bag.  Place a towel between your skin and the bag.  Leave the ice on for 15-20 minutes, 03-04 times a day for the first 2 to 3 days. After that, ice and heat may be alternated to reduce pain and spasms.  Ask your caregiver about trying back exercises and gentle massage. This may be of some benefit.  Avoid feeling anxious or stressed.Stress increases muscle tension and can worsen back pain.It is important to recognize when you are anxious or stressed and learn ways to manage it.Exercise is a great option. SEEK MEDICAL CARE IF:  You have pain that is not relieved with rest or medicine.  You have pain that does not improve in 1 week.  You have new symptoms.  You are generally not feeling well. SEEK IMMEDIATE MEDICAL CARE IF:   You have pain that radiates from your back into your legs.  You develop new bowel or bladder control problems.  You have unusual weakness or numbness in your arms or legs.  You develop nausea or vomiting.  You develop abdominal pain.  You feel faint. Document Released: 11/10/2005 Document Revised: 05/11/2012 Document Reviewed: 03/31/2011 [facility] Patient Information 2014 [place], [place].

## 2014-04-28 NOTE — ED Notes (Signed)
Pt given discharge prescriptions, after discharged then stated the ultram she was prescribed hurts her stomach. Pt informed she would not be getting any other prescriptions. Pt then ripped off the prescription from the paper work and gave it to nurse first. Michela Pitcher something to the extent the PA go could screw herself. Security present. Pt leaving facility.

## 2014-04-28 NOTE — ED Provider Notes (Signed)
Medical screening examination/treatment/procedure(s) were performed by non-physician practitioner and as supervising physician I was immediately available for consultation/collaboration.    Dorie Rank, MD 04/28/14 2330

## 2014-04-28 NOTE — ED Notes (Signed)
Pt requesting pain medication. rn told PA

## 2014-04-28 NOTE — ED Notes (Signed)
Pt reports bottom left tooth chipped off 3 weeks ago, pain 10/10. Also reports chronic back pain, lost her pcp, wants referral to a pain clinic through [facility].

## 2014-04-28 NOTE — ED Provider Notes (Signed)
CSN: [id number]     Arrival date & time 04/28/14  1509 History  This chart was scribed for non-physician practitioner, Delos Haring, PA-C,working with Dorie Rank, MD by Marlowe Kays, ED Scribe.  This patient was seen in room WTR5/WTR5 and the patient's care was started at 4:20 PM.  Chief Complaint  Patient presents with  . Dental Pain  . chronic back pain    The history is provided by the patient. No language interpreter was used.   HPI Comments:  Lynn Wallace is a 43 y.o. female with h/o chronic back pain and polysubstance abuse who presents to the Emergency Department complaining of severe dental pain that started three weeks ago secondary to chipping a bottom tooth and severe lower back pain that started the past 2-3 days. She states she has been on an antibiotic for her teeth but has not been taking it as prescribed. She states she has been taking OTC medications for pain with no relief. She states she wants to be seen by a "back doctor". She reports seeing her PCP a couple of days ago for her back. She denies bowel or bladder incontinence. She denies fall, injury, or trauma. She denies weakness of the lower extremities, numbness or tingling of her toes.   Past Medical History  Diagnosis Date  . Polysubstance abuse     including narcotics, cocaine, benzodiazepines,  and marijuana  . Psychiatric problem     multiple addmissons for detox  . 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 Chronic low back pain   . GERD (gastroesophageal reflux disease)   . Irritable bowel syndrome (IBS)   . Anxiety   . UTI (lower urinary tract infection)    Past Surgical History  Procedure Laterality Date  . Tubal ligation    . Dilation and curettage of uterus     Family History  Problem Relation Age of Onset  . Coronary artery disease      Female 1st degree relative <60  . Lung cancer    . Stroke      M 1st degree relative <50  . Arthritis Mother    History   Substance Use Topics  . Smoking status: Current Every Day Smoker    Types: Cigarettes  . Smokeless tobacco: Never Used     Comment: 1/2 pack or less  . Alcohol Use: No   OB History   Grav Para Term Preterm Abortions TAB SAB Ect Mult Living   7 5 5  0 2 1 1   5      Review of Systems  Constitutional: Negative for fever.  HENT: Positive for dental problem.   Genitourinary: Negative for difficulty urinating.  Musculoskeletal: Positive for back pain.  Neurological: Negative for weakness and numbness.  All other systems reviewed and are negative.   Allergies  Doxycycline; Hydrocodone-acetaminophen; Ibuprofen; Penicillins; Cleocin; and Keflex  Home Medications   Prior to Admission medications   Medication Sig Start Date End Date Taking? Authorizing Provider  acetaminophen (TYLENOL) 500 MG tablet Take 500 mg by mouth every 6 (six) hours as needed.    Historical Provider, MD  azithromycin (ZITHROMAX) 250 MG tablet Take 1 tablet (250 mg total) by mouth daily. Take first 2 tablets together, then 1 every day until finished. 04/28/14   Johany Hansman Marilu Favre, PA-C  doxycycline (VIBRAMYCIN) 100 MG capsule Take 1 capsule (100 mg total) by mouth 2 (two) times daily. 04/08/14   Carmin Muskrat, MD  ibuprofen (  ADVIL,MOTRIN) 200 MG tablet Take 200 mg by mouth every 6 (six) hours as needed.    Historical Provider, MD  naproxen sodium (ANAPROX) 220 MG tablet Take 220 mg by mouth 2 (two) times daily with a meal.    Historical Provider, MD  oxyCODONE-acetaminophen (PERCOCET/ROXICET) 5-325 MG per tablet Take 1-2 tablets by mouth every 6 (six) hours as needed for severe pain. 04/08/14   Carmin Muskrat, MD  PRESCRIPTION MEDICATION Take 1 tablet by mouth daily. For bipolar disorder.    Historical Provider, MD  QUEtiapine Fumarate (SEROQUEL PO) Take 1 tablet by mouth at bedtime.    Historical Provider, MD  sulfamethoxazole-trimethoprim (SEPTRA DS) 800-160 MG per tablet Take 1 tablet by mouth every 12 (twelve) hours.  04/12/14   Garald Balding, NP  traMADol (ULTRAM) 50 MG tablet Take 1 tablet (50 mg total) by mouth 2 (two) times daily. 04/12/14   Garald Balding, NP  traMADol (ULTRAM) 50 MG tablet Take 1 tablet (50 mg total) by mouth every 6 (six) hours as needed. 04/28/14   Linus Mako, PA-C   Triage Vitals: BP 111/84  Pulse 93  Temp(Src) 98.2 F (36.8 C) (Oral)  Resp 18  SpO2 97%  LMP 10/05/2013 Physical Exam  Nursing note and vitals reviewed. Constitutional: She is oriented to person, place, and time. She appears well-developed and well-nourished.  HENT:  Head: Normocephalic and atraumatic.  Mouth/Throat:    Eyes: EOM are normal.  Neck: Normal range of motion.  Cardiovascular: Normal rate.   Pulmonary/Chest: Effort normal.  Musculoskeletal: Normal range of motion.  Pt has equal strength to bilateral lower extremities.  Neurosensory function adequate to both legs No clonus on dorsiflextion Skin color is normal. Skin is warm and moist.  I see no step off deformity, no midline bony tenderness.  Pt is able to ambulate.  No crepitus, laceration, effusion, induration, lesions, swelling.   Pedal pulses are symmetrical and palpable bilaterally  Lower lumbar tenderness to palpation of paraspinel muscles   Neurological: She is alert and oriented to person, place, and time.  Skin: Skin is warm and dry.  Psychiatric: She has a normal mood and affect. Her behavior is normal.    ED Course  Procedures (including critical care time) DIAGNOSTIC STUDIES: Oxygen Saturation is 97% on RA, normal by my interpretation.   COORDINATION OF CARE: 4:28 PM- Will give dental referral, orthopedic referral and pain medication prior to discharge. Will give a prescription for antibiotics. Pt verbalizes understanding and agrees to plan.  Medications  oxyCODONE-acetaminophen (PERCOCET/ROXICET) 5-325 MG per tablet 2 tablet (2 tablets Oral Given 04/28/14 1640)  ondansetron (ZOFRAN-ODT) disintegrating tablet 4 mg (4 mg  Oral Given 04/28/14 1640)    Labs Review Labs Reviewed - No data to display  Imaging Review No results found.   EKG Interpretation None      MDM   Final diagnoses:  Chronic back pain  Toothache    Patient is agitated before I entered room because she waited an hour. Concern for drug seeking behavior. She became very upset when told she would not get a narcotic prescription. She told me that I could "screw myself" and ripped it up. She was given percocet in the ED for pain control. She was given numerous referral paper work and I had Theatre stage manager talk to her about helping set up local resources to help her.   43 y.o.Lynn Wallace's  with back pain. No neurological deficits and normal neuro exam. Patient can walk  but states is painful. No loss of bowel or bladder control. No concern for cauda equina. No fever, night sweats, weight loss, h/o cancer, IVDU. RICE protocol and pain medicine indicated and discussed with patient.   Patient Plan 1. Medications: pain medication (Ultram) and usual home medications  2. Treatment: rest, drink plenty of fluids, gentle stretching as discussed, alternate ice and heat  3. Follow Up: Please followup with your primary doctor for discussion of your diagnoses and further evaluation after today's visit; if you do not have a primary care doctor use the resource guide provided to find one   Vital signs are stable at discharge. Filed Vitals:   04/28/14 1514  BP: 111/84  Pulse: 93  Temp: 98.2 F (36.8 C)  Resp: 18    Patient/guardian has voiced understanding and agreed to follow-up with the PCP or specialist.   I personally performed the services described in this documentation, which was scribed in my presence. The recorded information has been reviewed and is accurate.      I personally performed the services described in this documentation, which was scribed in my presence. The recorded information has been reviewed and is  accurate.    Linus Mako, PA-C 04/28/14 1707

## 2014-04-28 NOTE — ED Notes (Signed)
Pt alert, nad, resp even unlabored, skin pwd, ambulates to discharge

## 2014-05-06 ENCOUNTER — Emergency Department (HOSPITAL_BASED_OUTPATIENT_CLINIC_OR_DEPARTMENT_OTHER)
Admission: EM | Admit: 2014-05-06 | Discharge: 2014-05-06 | Disposition: A | Discharge status: Home / Self Care | Payer: No Typology Code available for payment source | Attending: Emergency Medicine | Admitting: Emergency Medicine

## 2014-05-06 ENCOUNTER — Encounter (HOSPITAL_BASED_OUTPATIENT_CLINIC_OR_DEPARTMENT_OTHER): Payer: Self-pay | Admitting: Emergency Medicine

## 2014-05-06 DIAGNOSIS — K0889 Other specified disorders of teeth and supporting structures: Secondary | ICD-10-CM

## 2014-05-06 DIAGNOSIS — K089 Disorder of teeth and supporting structures, unspecified: Secondary | ICD-10-CM | POA: Insufficient documentation

## 2014-05-06 DIAGNOSIS — M549 Dorsalgia, unspecified: Secondary | ICD-10-CM

## 2014-05-06 DIAGNOSIS — Z880 Allergy status to penicillin: Secondary | ICD-10-CM | POA: Insufficient documentation

## 2014-05-06 DIAGNOSIS — R350 Frequency of micturition: Secondary | ICD-10-CM | POA: Insufficient documentation

## 2014-05-06 DIAGNOSIS — F319 Bipolar disorder, unspecified: Secondary | ICD-10-CM | POA: Insufficient documentation

## 2014-05-06 DIAGNOSIS — Z792 Long term (current) use of antibiotics: Secondary | ICD-10-CM | POA: Insufficient documentation

## 2014-05-06 DIAGNOSIS — M545 Low back pain, unspecified: Secondary | ICD-10-CM | POA: Insufficient documentation

## 2014-05-06 DIAGNOSIS — K029 Dental caries, unspecified: Secondary | ICD-10-CM | POA: Insufficient documentation

## 2014-05-06 DIAGNOSIS — F411 Generalized anxiety disorder: Secondary | ICD-10-CM | POA: Insufficient documentation

## 2014-05-06 DIAGNOSIS — G8929 Other chronic pain: Secondary | ICD-10-CM | POA: Insufficient documentation

## 2014-05-06 DIAGNOSIS — M25529 Pain in unspecified elbow: Secondary | ICD-10-CM | POA: Insufficient documentation

## 2014-05-06 DIAGNOSIS — Z87440 Personal history of urinary (tract) infections: Secondary | ICD-10-CM | POA: Insufficient documentation

## 2014-05-06 DIAGNOSIS — Z791 Long term (current) use of non-steroidal anti-inflammatories (NSAID): Secondary | ICD-10-CM | POA: Insufficient documentation

## 2014-05-06 DIAGNOSIS — Z79899 Other long term (current) drug therapy: Secondary | ICD-10-CM | POA: Insufficient documentation

## 2014-05-06 DIAGNOSIS — F17200 Nicotine dependence, unspecified, uncomplicated: Secondary | ICD-10-CM | POA: Insufficient documentation

## 2014-05-06 MED ORDER — PREDNISONE 10 MG PO TABS: 20.0000 mg | ORAL_TABLET | Freq: Two times a day (BID) | ORAL | Status: DC

## 2014-05-06 MED ORDER — ERYTHROMYCIN BASE 250 MG PO TABS: 250.0000 mg | ORAL_TABLET | Freq: Four times a day (QID) | ORAL | Status: DC

## 2014-05-06 MED ORDER — CYCLOBENZAPRINE HCL 10 MG PO TABS
10.0000 mg | ORAL_TABLET | Freq: Once | ORAL | Status: DC
  Filled 2014-05-06: qty 1

## 2014-05-06 MED ORDER — CYCLOBENZAPRINE HCL 10 MG PO TABS: 10.0000 mg | ORAL_TABLET | Freq: Three times a day (TID) | ORAL | Status: DC | PRN

## 2014-05-06 NOTE — ED Notes (Signed)
MD at bedside. 

## 2014-05-06 NOTE — Discharge Instructions (Signed)
Prednisone as prescribed.  Erythromycin as prescribed.  Flexeril as prescribed as needed for pain.   Back Pain, Adult Low back pain is very common. About 1 in 5 people have back pain.The cause of low back pain is rarely dangerous. The pain often gets better over time.About half of people with a sudden onset of back pain feel better in just 2 weeks. About 8 in 10 people feel better by 6 weeks.  CAUSES Some common causes of back pain include:  Strain of the muscles or ligaments supporting the spine.  Wear and tear (degeneration) of the spinal discs.  Arthritis.  Direct injury to the back. DIAGNOSIS Most of the time, the direct cause of low back pain is not known.However, back pain can be treated effectively even when the exact cause of the pain is unknown.Answering your caregiver's questions about your overall health and symptoms is one of the most accurate ways to make sure the cause of your pain is not dangerous. If your caregiver needs more information, he or she may order lab work or imaging tests (X-rays or MRIs).However, even if imaging tests show changes in your back, this usually does not require surgery. HOME CARE INSTRUCTIONS For many people, back pain returns.Since low back pain is rarely dangerous, it is often a condition that people can learn to [facility] their own.   Remain active. It is stressful on the back to sit or stand in one place. Do not sit, drive, or stand in one place for more than 30 minutes at a time. Take short walks on level surfaces as soon as pain allows.Try to increase the length of time you walk each day.  Do not stay in bed.Resting more than 1 or 2 days can delay your recovery.  Do not avoid exercise or work.Your body is made to move.It is not dangerous to be active, even though your back may hurt.Your back will likely heal faster if you return to being active before your pain is gone.  Pay attention to your body when you bend and lift. Many  people have less discomfortwhen lifting if they bend their knees, keep the load close to their bodies,and avoid twisting. Often, the most comfortable positions are those that put less stress on your recovering back.  Find a comfortable position to sleep. Use a firm mattress and lie on your side with your knees slightly bent. If you lie on your back, put a pillow under your knees.  Only take over-the-counter or prescription medicines as directed by your caregiver. Over-the-counter medicines to reduce pain and inflammation are often the most helpful.Your caregiver may prescribe muscle relaxant drugs.These medicines help dull your pain so you can more quickly return to your normal activities and healthy exercise.  Put ice on the injured area.  Put ice in a plastic bag.  Place a towel between your skin and the bag.  Leave the ice on for 15-20 minutes, 03-04 times a day for the first 2 to 3 days. After that, ice and heat may be alternated to reduce pain and spasms.  Ask your caregiver about trying back exercises and gentle massage. This may be of some benefit.  Avoid feeling anxious or stressed.Stress increases muscle tension and can worsen back pain.It is important to recognize when you are anxious or stressed and learn ways to manage it.Exercise is a great option. SEEK MEDICAL CARE IF:  You have pain that is not relieved with rest or medicine.  You have pain that does not  improve in 1 week.  You have new symptoms.  You are generally not feeling well. SEEK IMMEDIATE MEDICAL CARE IF:   You have pain that radiates from your back into your legs.  You develop new bowel or bladder control problems.  You have unusual weakness or numbness in your arms or legs.  You develop nausea or vomiting.  You develop abdominal pain.  You feel faint. Document Released: 11/10/2005 Document Revised: 05/11/2012 Document Reviewed: 03/31/2011 [employer] Patient Information 2014 [place],  [place].  Dental Pain A tooth ache may be caused by cavities (tooth decay). Cavities expose the nerve of the tooth to air and hot or cold temperatures. It may come from an infection or abscess (also called a boil or furuncle) around your tooth. It is also often caused by dental caries (tooth decay). This causes the pain you are having. DIAGNOSIS  Your caregiver can diagnose this problem by exam. TREATMENT   If caused by an infection, it may be treated with medications which kill germs (antibiotics) and pain medications as prescribed by your caregiver. Take medications as directed.  Only take over-the-counter or prescription medicines for pain, discomfort, or fever as directed by your caregiver.  Whether the tooth ache today is caused by infection or dental disease, you should see your dentist as soon as possible for further care. SEEK MEDICAL CARE IF: The exam and treatment you received today has been provided on an emergency basis only. This is not a substitute for complete medical or dental care. If your problem worsens or new problems (symptoms) appear, and you are unable to meet with your dentist, call or return to this location. SEEK IMMEDIATE MEDICAL CARE IF:   You have a fever.  You develop redness and swelling of your face, jaw, or neck.  You are unable to open your mouth.  You have severe pain uncontrolled by pain medicine. MAKE SURE YOU:   Understand these instructions.  Will watch your condition.  Will get help right away if you are not doing well or get worse. Document Released: 11/10/2005 Document Revised: 02/02/2012 Document Reviewed: 06/28/2008 [facility] Patient Information 2014 [place].

## 2014-05-06 NOTE — ED Notes (Signed)
Patient here with multiple complaints of chronic pain-particularly with her teeth, arm, and back.  Also reports panic attacks.  When I went to get patient for triage, she left the building to get something out of her car for registration purposes.

## 2014-05-06 NOTE — ED Notes (Signed)
At bedside with Dr Stark Jock during assessment of pt. Pt first stated "I was here last week and you people didn't help me so I went to [facility] and they diagnosed me with tendonitis which I know isn't right". Dr Stark Jock examined pt and pt also c/o chronic back pain and dental pain in addition to right arm pain.

## 2014-05-06 NOTE — ED Notes (Signed)
Pt left room 2 and went to room 12 where her "fiancee" was a patient. Pt called out and asked for referral to a doctor. Pt given resource sheet with physician referral line phone numbers.

## 2014-05-06 NOTE — ED Notes (Signed)
Patient has checked in with her fiance' and asks that 'we pay him no mind.'

## 2014-05-06 NOTE — ED Notes (Signed)
Patient was discharged and waited with her significant other in treatment room 12 while he was being evaluated. This patient took items from drawers, stuffing tape, alcohol prep pads, patient socks, bandaids in her pockets. Off duty [employer] officer notified and items removed from her shorts pockets. Patient left premises and security aware of event.

## 2014-05-06 NOTE — ED Notes (Addendum)
Reviewed d/c papers with pt including indications for prescribed medicines (flexeril, erythromycin and prednisone). Pt refused paperwork initially stating "these medications aren't going to help me- I told him that." Explained to patient that the medications were being prescribed to treat the source of her pain. Pt took papers but did not sign d/c instructions

## 2014-05-06 NOTE — ED Provider Notes (Signed)
CSN: [id number]     Arrival date & time 05/06/14  1546 History  This chart was scribed for Veryl Speak, MD by Randa Evens, ED Scribe. This patient was seen in room MH02/MH02 and the patient's care was started at 4:00 PM.    Chief Complaint  Patient presents with  . Pain   The history is provided by the patient. No language interpreter was used.   HPI Comments: Lynn Wallace is a 43 y.o. female who presents to the Emergency Department complaining of pain in her right arm, left lower tooth pain, and chronic lower back pain. She states that the pain is radiating down her arm. She states she has trouble pickling things up without pain. She states she feels like she cracked a bone in her arm after lifting an air condition. She states she has been taking aleve, tylenol and motrin with no relief to her her symptoms. She states she has associated frequent urination.   Past Medical History  Diagnosis Date  . Polysubstance abuse     including narcotics, cocaine, benzodiazepines,  and marijuana  . Psychiatric problem     multiple addmissons for detox  . 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 Chronic low back pain   . GERD (gastroesophageal reflux disease)   . Irritable bowel syndrome (IBS)   . Anxiety   . UTI (lower urinary tract infection)    Past Surgical History  Procedure Laterality Date  . Tubal ligation    . Dilation and curettage of uterus     Family History  Problem Relation Age of Onset  . Coronary artery disease      Female 1st degree relative <60  . Lung cancer    . Stroke      M 1st degree relative <50  . Arthritis Mother    History  Substance Use Topics  . Smoking status: Current Every Day Smoker    Types: Cigarettes  . Smokeless tobacco: Never Used     Comment: 1/2 pack or less  . Alcohol Use: No   OB History   Grav Para Term Preterm Abortions TAB SAB Ect Mult Living   7 5 5  0 2 1 1   5      Review of Systems   Genitourinary: Positive for frequency.  Musculoskeletal: Positive for arthralgias and back pain. Negative for gait problem.   A complete 10 system review of systems was obtained and all systems are negative except as noted in the HPI and PMH.    Allergies  Doxycycline; Hydrocodone-acetaminophen; Ibuprofen; Penicillins; Cleocin; and Keflex  Home Medications   Prior to Admission medications   Medication Sig Start Date End Date Taking? Authorizing Provider  acetaminophen (TYLENOL) 500 MG tablet Take 500 mg by mouth every 6 (six) hours as needed.    Historical Provider, MD  azithromycin (ZITHROMAX) 250 MG tablet Take 1 tablet (250 mg total) by mouth daily. Take first 2 tablets together, then 1 every day until finished. 04/28/14   Tiffany Marilu Favre, PA-C  doxycycline (VIBRAMYCIN) 100 MG capsule Take 1 capsule (100 mg total) by mouth 2 (two) times daily. 04/08/14   Carmin Muskrat, MD  ibuprofen (ADVIL,MOTRIN) 200 MG tablet Take 200 mg by mouth every 6 (six) hours as needed.    Historical Provider, MD  naproxen sodium (ANAPROX) 220 MG tablet Take 220 mg by mouth 2 (two) times daily with a meal.    Historical Provider, MD  oxyCODONE-acetaminophen (PERCOCET/ROXICET)  5-325 MG per tablet Take 1-2 tablets by mouth every 6 (six) hours as needed for severe pain. 04/08/14   Carmin Muskrat, MD  PRESCRIPTION MEDICATION Take 1 tablet by mouth daily. For bipolar disorder.    Historical Provider, MD  QUEtiapine Fumarate (SEROQUEL PO) Take 1 tablet by mouth at bedtime.    Historical Provider, MD  sulfamethoxazole-trimethoprim (SEPTRA DS) 800-160 MG per tablet Take 1 tablet by mouth every 12 (twelve) hours. 04/12/14   Garald Balding, NP  traMADol (ULTRAM) 50 MG tablet Take 1 tablet (50 mg total) by mouth 2 (two) times daily. 04/12/14   Garald Balding, NP  traMADol (ULTRAM) 50 MG tablet Take 1 tablet (50 mg total) by mouth every 6 (six) hours as needed. 04/28/14   Linus Mako, PA-C   Triage Vitals: BP 104/79  Pulse  104  Temp(Src) 98.6 F (37 C) (Oral)  Resp 20  Ht 5\' 2"  (1.575 m)  Wt 185 lb (83.915 kg)  BMI 33.83 kg/m2  SpO2 96%  LMP 10/05/2013  Physical Exam  Nursing note and vitals reviewed. Constitutional: She is oriented to person, place, and time. She appears well-developed and well-nourished. No distress.  HENT:  Head: Normocephalic and atraumatic.  There are several decayed molars in the left lower jaw. There is no evidence for abscess or gingival inflammation.   Eyes: Conjunctivae and EOM are normal.  Neck: Normal range of motion.  Cardiovascular: Normal rate.   Pulmonary/Chest: Effort normal. No respiratory distress.  Musculoskeletal: Normal range of motion.  tenderness to palpation over lateral elbow. No obvious abnormality. She has Full ROM. Distal ulnar and radial pulses intact.    Tenderness to palpation soft tissues of lumbar region.    Neurological: She is alert and oriented to person, place, and time.  DTR are 1+ and equal in the bilateral lower extremities. Strength is 5/5 in bilateral lower extremities, She can ambulate without difficulty  Skin: Skin is warm and dry.  Psychiatric: She has a normal mood and affect. Her behavior is normal.    ED Course  Procedures (including critical care time) DIAGNOSTIC STUDIES: Oxygen Saturation is 96% on RA, adequate by my interpretation.    COORDINATION OF CARE: 4:07 PM-Discussed treatment plan which includes Flexeril with pt at bedside and pt agreed to plan.     Labs Review Labs Reviewed - No data to display  Imaging Review No results found.   EKG Interpretation None      MDM   Final diagnoses:  None       Patient presents here with complaints of multiple painful conditions. She has history of chronic pain and suspected narcotic seeking behavior. I see nothing on her physical exam that appears up urgent. Her strength is symmetrical in her arms and legs, reflexes are intact, and there are no bowel or bladder  complaints. She has dental pain and multiple care is, however no evidence for abscess. She will be treated with antibiotics for dental pain and I agreed to write for prednisone for inflammation in her back and elbow and Flexeril as needed for pain.  She is unhappy with my choice of pain medications and is requesting Percocet. Upon reviewing her medical record, she has a documented history of confrontations with medical providers over narcotics. On one occasion she has told one of our physician assistants to "go screw herself" when she did not agree to write her narcotics.  This patient is also here with her significant other who I am also  treating for an ankle sprain. When performing the interview with this gentleman, he informed me that Merrilyn is "crazy". He told me that she has stolen his medications multiple times in the past, most recently this morning. He states he has spoken with the police and plans to file a report on Monday. He also tells me he is in the process of taking action to have  her removed from his home due to her drug abuse.  He also informed me that while Tanyia was in his exam room, she was running through the supply drawers and has stolen multiple items.  The police have been notified and are investigating.    I personally performed the services described in this documentation, which was scribed in my presence. The recorded information has been reviewed and is accurate.      Veryl Speak, MD 05/06/14 Vernelle Emerald

## 2014-07-30 ENCOUNTER — Encounter (HOSPITAL_COMMUNITY): Payer: Self-pay | Admitting: Emergency Medicine

## 2014-07-30 ENCOUNTER — Emergency Department (HOSPITAL_COMMUNITY)
Admission: EM | Admit: 2014-07-30 | Discharge: 2014-07-30 | Disposition: A | Discharge status: Home / Self Care | Payer: No Typology Code available for payment source | Attending: Emergency Medicine | Admitting: Emergency Medicine

## 2014-07-30 DIAGNOSIS — Z880 Allergy status to penicillin: Secondary | ICD-10-CM | POA: Insufficient documentation

## 2014-07-30 DIAGNOSIS — Z87440 Personal history of urinary (tract) infections: Secondary | ICD-10-CM | POA: Insufficient documentation

## 2014-07-30 DIAGNOSIS — R6883 Chills (without fever): Secondary | ICD-10-CM | POA: Insufficient documentation

## 2014-07-30 DIAGNOSIS — K089 Disorder of teeth and supporting structures, unspecified: Secondary | ICD-10-CM | POA: Insufficient documentation

## 2014-07-30 DIAGNOSIS — Z8659 Personal history of other mental and behavioral disorders: Secondary | ICD-10-CM | POA: Insufficient documentation

## 2014-07-30 DIAGNOSIS — F17200 Nicotine dependence, unspecified, uncomplicated: Secondary | ICD-10-CM | POA: Insufficient documentation

## 2014-07-30 DIAGNOSIS — K0889 Other specified disorders of teeth and supporting structures: Secondary | ICD-10-CM

## 2014-07-30 DIAGNOSIS — K029 Dental caries, unspecified: Secondary | ICD-10-CM | POA: Insufficient documentation

## 2014-07-30 DIAGNOSIS — G8929 Other chronic pain: Secondary | ICD-10-CM | POA: Insufficient documentation

## 2014-07-30 MED ORDER — OXYCODONE-ACETAMINOPHEN 5-325 MG PO TABS: 1.0000 | ORAL_TABLET | ORAL | Status: DC | PRN

## 2014-07-30 MED ORDER — AZITHROMYCIN 250 MG PO TABS: 250.0000 mg | ORAL_TABLET | Freq: Every day | ORAL | Status: DC

## 2014-07-30 MED ORDER — OXYCODONE-ACETAMINOPHEN 5-325 MG PO TABS
1.0000 | ORAL_TABLET | Freq: Once | ORAL | Status: AC
  Administered 2014-07-30: 1 via ORAL
  Filled 2014-07-30: qty 1

## 2014-07-30 NOTE — ED Provider Notes (Signed)
CSN: [id number]     Arrival date & time 07/30/14  1754 History   First MD Initiated Contact with Patient 07/30/14 1926   This chart was scribed for non-physician practitioner Vivi Barrack, PA-C,  working with Fredia Sorrow, MD by Rosary Lively, ED scribe. This patient was seen in room TR09C/TR09C and the patient's care was started at 8:11 PM.    Chief Complaint  Patient presents with  . Dental Pain   The history is provided by the patient. No language interpreter was used.   HPI Comments:  Lynn Wallace is a 43 y.o. female who presents to the Emergency Department complaining of lower left dental pain that radiates through her jaw to her ear, onset 3 months ago. Pt reports that she attempted to attend dental clinic, but had to leave due to emergency. Pt reports that she has experienced chills and swelling of her face. Pt reports that she has also experienced discharge, and popping of her jaw. Pt denies fever, nausea, or diarrhea. Pt is allergic to penicillin, Vicodin and ibuprofen. Pt is a smoker. All other ROS negative.   Past Medical History  Diagnosis Date  . Polysubstance abuse     including narcotics, cocaine, benzodiazepines,  and marijuana  . Psychiatric problem     multiple addmissons for detox  . 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 Chronic low back pain   . GERD (gastroesophageal reflux disease)   . Irritable bowel syndrome (IBS)   . Anxiety   . UTI (lower urinary tract infection)    Past Surgical History  Procedure Laterality Date  . Tubal ligation    . Dilation and curettage of uterus     Family History  Problem Relation Age of Onset  . Coronary artery disease      Female 1st degree relative <60  . Lung cancer    . Stroke      M 1st degree relative <50  . Arthritis Mother    History  Substance Use Topics  . Smoking status: Current Every Day Smoker    Types: Cigarettes  . Smokeless tobacco: Never Used     Comment: 1/2  pack or less  . Alcohol Use: No   OB History   Grav Para Term Preterm Abortions TAB SAB Ect Mult Living   7 5 5  0 2 1 1   5      Review of Systems  Constitutional: Positive for chills.  HENT: Positive for dental problem.   All other systems reviewed and are negative.     Allergies  Doxycycline; Hydrocodone-acetaminophen; Ibuprofen; Penicillins; Cleocin; and Keflex  Home Medications   Prior to Admission medications   Medication Sig Start Date End Date Taking? Authorizing Provider  acetaminophen (TYLENOL) 500 MG tablet Take 500 mg by mouth every 6 (six) hours as needed for mild pain.    Yes Historical Provider, MD  azithromycin (ZITHROMAX) 250 MG tablet Take 1 tablet (250 mg total) by mouth daily. Take first 2 tablets together, then 1 every day until finished. 07/30/14   Stacie Templin A Forcucci, PA-C  oxyCODONE-acetaminophen (PERCOCET/ROXICET) 5-325 MG per tablet Take 1 tablet by mouth every 4 (four) hours as needed for moderate pain or severe pain. 07/30/14   Corion Sherrod A Forcucci, PA-C   BP 96/67  Pulse 81  Temp(Src) 97.5 F (36.4 C) (Oral)  Resp 18  Ht 5\' 2"  (1.575 m)  Wt 210 lb (95.255 kg)  BMI 38.40 kg/m2  SpO2  97%  LMP 10/05/2013 Physical Exam  Nursing note and vitals reviewed. Constitutional: She is oriented to person, place, and time. She appears well-developed and well-nourished.  HENT:  Head: Normocephalic and atraumatic.  Mouth/Throat: Uvula is midline, oropharynx is clear and moist and mucous membranes are normal. No trismus in the jaw. No uvula swelling. No oropharyngeal exudate.    No facial swelling  Eyes: Conjunctivae and EOM are normal. Pupils are equal, round, and reactive to light.  Neck: Normal range of motion. Neck supple. No JVD present. No thyromegaly present.  Cardiovascular: Normal rate, regular rhythm, normal heart sounds and intact distal pulses.  Exam reveals no gallop and no friction rub.   No murmur heard. Pulmonary/Chest: Effort normal and breath  sounds normal. No respiratory distress. She has no wheezes. She has no rales. She exhibits no tenderness.  Musculoskeletal: Normal range of motion.  Lymphadenopathy:    She has no cervical adenopathy.  Neurological: She is alert and oriented to person, place, and time.  Skin: Skin is warm and dry.  Psychiatric: She has a normal mood and affect. Her behavior is normal.    ED Course  Procedures  DIAGNOSTIC STUDIES: Oxygen Saturation is 97% on RA, adequate by my interpretation.  COORDINATION OF CARE: 8:15 PM-Discussed treatment plan which includes antibiotics, and follow-up with a dentist with pt at bedside and pt agreed to plan.  Labs Review Labs Reviewed - No data to display  Imaging Review No results found.   EKG Interpretation None      MDM   Final diagnoses:  Dental caries  Pain, dental   Patient is a 43 y.o. Female who presents to the ED with dental pain.  Patient has dental caries with obvious decay. No signs of trismus, drooling, or ludwigs angina.  Will cover with azithromycin as she is allergic to first line treatments.  Patient to follow-up with dentist.  Patient given percocet here and prescription for percocet for home.  Patient told to return for trismus and ludwigs angina symptoms.  Patient is stable for discharge.  Patient given referral to Dr. Renne Musca.     I personally performed the services described in this documentation, which was scribed in my presence. The recorded information has been reviewed and is accurate.     Cherylann Parr, PA-C 07/31/14 [phone]

## 2014-07-30 NOTE — ED Notes (Signed)
Pt reports left lower side dental pain. Airway intact.

## 2014-07-30 NOTE — Discharge Instructions (Signed)
Dental Pain A tooth ache may be caused by cavities (tooth decay). Cavities expose the nerve of the tooth to air and hot or cold temperatures. It may come from an infection or abscess (also called a boil or furuncle) around your tooth. It is also often caused by dental caries (tooth decay). This causes the pain you are having. DIAGNOSIS  Your caregiver can diagnose this problem by exam. TREATMENT   If caused by an infection, it may be treated with medications which kill germs (antibiotics) and pain medications as prescribed by your caregiver. Take medications as directed.  Only take over-the-counter or prescription medicines for pain, discomfort, or fever as directed by your caregiver.  Whether the tooth ache today is caused by infection or dental disease, you should see your dentist as soon as possible for further care. SEEK MEDICAL CARE IF: The exam and treatment you received today has been provided on an emergency basis only. This is not a substitute for complete medical or dental care. If your problem worsens or new problems (symptoms) appear, and you are unable to meet with your dentist, call or return to this location. SEEK IMMEDIATE MEDICAL CARE IF:   You have a fever.  You develop redness and swelling of your face, jaw, or neck.  You are unable to open your mouth.  You have severe pain uncontrolled by pain medicine. MAKE SURE YOU:   Understand these instructions.  Will watch your condition.  Will get help right away if you are not doing well or get worse. Document Released: 11/10/2005 Document Revised: 02/02/2012 Document Reviewed: 06/28/2008 [facility] Patient Information 2015 [place], [place]. This information is not intended to replace advice given to you by your health care provider. Make sure you discuss any questions you have with your health care provider.  Dental Caries Dental caries is tooth decay. This decay can cause a hole in teeth (cavity) that can get bigger and  deeper over time. HOME CARE  Brush and floss your teeth. Do this at least two times a day.  Use a fluoride toothpaste.  Use a mouth rinse if told by your dentist or doctor.  Eat less sugary and starchy foods. Drink less sugary drinks.  Avoid snacking often on sugary and starchy foods. Avoid sipping often on sugary drinks.  Keep regular checkups and cleanings with your dentist.  Use fluoride supplements if told by your dentist or doctor.  Allow fluoride to be applied to teeth if told by your dentist or doctor. Document Released: 08/19/2008 Document Revised: 03/27/2014 Document Reviewed: 11/12/2012 [facility] Patient Information 2015 [place], [place]. This information is not intended to replace advice given to you by your health care provider. Make sure you discuss any questions you have with your health care provider.   Emergency Department Resource Guide 1) Find a Doctor and Pay Out of Pocket Although you won't have to find out who is covered by your insurance plan, it is a good idea to ask around and get recommendations. You will then need to call the office and see if the doctor you have chosen will accept you as a new patient and what types of options they offer for patients who are self-pay. Some doctors offer discounts or will set up payment plans for their patients who do not have insurance, but you will need to ask so you aren't surprised when you get to your appointment.  2) Contact Your Local Health Department Not all health departments have doctors that can see patients for sick  visits, but many do, so it is worth a call to see if yours does. If you don't know where your local health department is, you can check in your phone book. The CDC also has a tool to help you locate your state's health department, and many state websites also have listings of all of their local health departments.  3) Find a [facility] If your illness is not likely to be very severe or complicated,  you may want to try a walk in clinic. These are popping up all over the country in pharmacies, drugstores, and shopping centers. They're usually staffed by nurse practitioners or physician assistants that have been trained to treat common illnesses and complaints. They're usually fairly quick and inexpensive. However, if you have serious medical issues or chronic medical problems, these are probably not your best option.  No Primary Care Doctor: - Call Health Connect at  [phone] - they can help you locate a primary care doctor that  accepts your insurance, provides certain services, etc. - Physician Referral Service- [phone]  Chronic Pain Problems: Organization         Address  Phone   Notes  [facility]  [phone] Patients need to be referred by their primary care doctor.   Medication Assistance: Organization         Address  Phone   Notes  [facility] Medication [facility] [address] [place], [place] [address] [phone] --Must be a resident of [facility] -- Must have NO insurance coverage whatsoever (no Medicaid/ Medicare, etc.) -- The pt. MUST have a primary care doctor that directs their care regularly and follows them in the community   MedAssist  [phone]   [employer]  [phone]    Agencies that provide inexpensive medical care: Organization         Address  Phone   Notes  [place]  [phone]   [facility]    [phone]   [facility] [place], [place] [address] [phone]   [place] [address] [place] [phone]   Planned Parenthood    [phone]   [facility]    [phone]   [place] and [place] [address], [place] Phone:  [phone], Fax:  [phone] Hours of Operation:  9 am - 6 pm, M-F.  Also accepts Medicaid/Medicare and  self-pay.  [facility] for [place] [address], [address], [place] Phone: [phone], Fax: [phone]. Hours of Operation:  8:30 am - 5:30 pm, M-F.  Also accepts Medicaid and self-pay.  [facility] [address] [place] Phone: [phone]   [place], [place], [place] [phone], Ext. [phone] Mondays & Thursdays: 7-9 AM.  First 15 patients are seen on a first come, first serve basis.    [place] Providers:  Organization         Address  Phone   Notes  [facility] [address],  [phone] Also accepts self-pay patients.  [place], [place]  [phone]   [place], [address], [place] [phone]   [facility] [address] [place] [phone]  Lucianne Lei [address], [place]   [phone] Only accepts [place] patients after they have their name applied to their card.   Self-Pay (no insurance) in [facility]:  Organization         Address  Phone   Notes  Sickle Cell Patients, [facility] [place] [phone]   [facility] [place] [phone]   [facility] [place]  [place], [place], [place] [phone]   [facility]/Dr. Osei-Bonsu  [address], [place] or [address], [place] [phone] Phone number for both [place] and [place] locations is the same.  Urgent Medical and [facility] [address] [place] [phone]   [facility] [address] [place] or [address] [phone]   [facility] [address], [place] [phone], phone;  [phone], fax Sees patients 1st and 3rd Saturday of every month.  Must not qualify for public or private insurance (i.e. Medicaid, Medicare, [place] Health Choice, Veterans' Benefits)  Household income should be no more than 200% of the poverty level The clinic cannot treat you if you are pregnant or think you are pregnant  Sexually transmitted diseases are not treated at the clinic.    Dental Care: Organization         Address  Phone  Notes  [facility] Department of [facility] [place] [phone] Accepts children up to age 76 who are enrolled in [place] or [place]; pregnant women with a Medicaid card; and children who have applied for Medicaid or [place] Health Choice, but were declined, whose parents can pay a reduced fee at time of service.  [facility] Department of [facility]  [address], [place] [phone] Accepts children up to age 84 who are enrolled in [place] or [place]; pregnant women with a Medicaid card; and children who have applied for Medicaid or [place] Health Choice, but were declined, whose parents can pay a reduced fee at time of service.  [place] Adult Dental Access PROGRAM  [place] [phone] Patients are seen by appointment only. Walk-ins are not accepted. [place] will see patients 62 years of age and older. Monday - Tuesday (8am-5pm) Most Wednesdays (8:30-5pm) $30 per visit, cash only  [facility] Adult Dental Access PROGRAM  [address], [facility] [phone] Patients are seen by appointment only. Walk-ins are not accepted. [place] will see patients 69 years of age and older. One Wednesday Evening (Monthly: Volunteer Based).  $30 per visit, cash only  [place]  [phone] for adults; Children under age 25, call Graduate Pediatric Dentistry at [phone]. Children aged 6-14, please call  [phone] to request a pediatric application.  Dental services are provided in all areas of dental care including fillings, crowns and bridges, complete and partial dentures, implants, gum treatment, root canals, and extractions. Preventive care is also provided. Treatment is provided to both adults and children. Patients are selected via a lottery and there is often a waiting list.   [facility] [address] [place]  [phone] [email or web address], [place], [place] [phone], Ext.  [phone] Second and Fourth Thursday of each month, opens at 6:30 AM; Clinic ends at 9 AM.  Patients are seen on a first-come first-served basis, and a limited number are seen during each clinic.   [facility]  [address] Hillard Danker [place], [place] [phone]   Eligibility Requirements You must have lived in [place], [place], or [place] counties for at least the last three months.   You cannot be eligible for state or federal sponsored [employer], including [employer], [place], or [employer].   You generally cannot be eligible for healthcare insurance through your employer.    How to apply: Eligibility screenings are held every Tuesday and Wednesday afternoon from 1:00 pm until 4:00 pm. You do not need an appointment for the interview!  [facility] [address] [place], [place]   [place]  [place] Department  [place]  [phone]    Behavioral Health Resources in the Community: Intensive Outpatient Programs Organization         Address  Phone  Notes  [facility]. [address] [place], [place] [phone]   [facility] Outpatient [address], [place], [place]   ADS: [facility] Drug [facility] [address] [place], [place]   [place] [address]  [place], [place] or [phone]   Substance Abuse Resources Organization         Address  Phone  Notes  Alcohol and Drug Services  [phone]   [place]  [phone]   [facility] [place]   Chinita Pester  [phone]   Residential & Outpatient Substance Abuse Program  [phone]   Psychological Services Organization         Address  Phone  Notes  [facility]  [place]  [phone]   [place] [address] [place] or [phone]    Mobile Crisis Teams Organization         Address  Phone  Notes  [facility], Mobile Crisis Care Unit  [phone]   [facility]  [address] [place], [place]   [place] [address], [place] [phone]    Self-Help/Support Groups Organization         Address  Phone             Notes  [place]. of [place] - variety of support groups  [place] Call for more information  Narcotics Anonymous (NA), [facility] [address], [employer] [place]  2 meetings at this location   Special educational needs teacher         Address  Phone  Notes  [facility] [place],    [place]  [phone]   [facility]  [address] [place] [address], [place], [place]   [place], [place] [phone] Admissions: 8am-3pm M-F  Incentives Substance [place] [address]    [place], [place] [phone]   [facility] [address] Jadene Pierini [place], [place]   The [place].,  [place], [place] - Intensive Outpatient [address] Kristeen [address], [place], [place]   [facility] ([place]  [facility] [address]  °[place], [place] [phone] or [phone]   °[facility] ([facility]) [address] [place], [place] [phone] Accepts Medicaid  °[facility] [address]  °[place] 1-800-659-3381 Substance Abuse/Addiction Treatment  ° °[place] Behavioral Health Resources °Organization         Address  Phone  Notes  °[facility]  ([phone]) [phone]   °Julie Brannon, PhD [address] [place], [place]   ([phone]) [phone] or (336) [phone]   °[facility]   [address] °[place], [place] ([phone]) [phone]   °[facility] [address], [place], [place] ([phone]) [phone] Insurance/Medicaid/sponsorship through [facility]  °[facility] [address]                                    [place], [place] ([phone]) [phone] Therapy/tele-psych/case  °[facility] [address]  ° [place], [place] ([phone]) [phone]    °Dr. Arfeen  ([phone]   °[facility] [place]  [employer] [place] [facility] 1) [address], [place] °2) [address], [place] °3)  [address], [place] ([phone]) [phone] °(336) [phone] ° °(336) [phone]   °[place] Child Abuse Hotline (336) 342-1394 or (336) 342-3537 (After Hours)    ° ° ° °

## 2014-08-03 NOTE — ED Provider Notes (Signed)
Medical screening examination/treatment/procedure(s) were performed by non-physician practitioner and as supervising physician I was immediately available for consultation/collaboration.   EKG Interpretation None        Fredia Sorrow, MD 08/03/14 [phone]

## 2014-08-09 ENCOUNTER — Emergency Department (INDEPENDENT_AMBULATORY_CARE_PROVIDER_SITE_OTHER)
Admission: EM | Admit: 2014-08-09 | Discharge: 2014-08-09 | Disposition: A | Discharge status: Home / Self Care | Payer: No Typology Code available for payment source | Source: Home / Self Care | Attending: Family Medicine | Admitting: Family Medicine

## 2014-08-09 ENCOUNTER — Encounter (HOSPITAL_COMMUNITY): Payer: Self-pay | Admitting: Emergency Medicine

## 2014-08-09 ENCOUNTER — Ambulatory Visit: Payer: No Typology Code available for payment source | Attending: Family Medicine

## 2014-08-09 DIAGNOSIS — G8929 Other chronic pain: Secondary | ICD-10-CM

## 2014-08-09 DIAGNOSIS — M549 Dorsalgia, unspecified: Secondary | ICD-10-CM

## 2014-08-09 DIAGNOSIS — F411 Generalized anxiety disorder: Secondary | ICD-10-CM

## 2014-08-09 MED ORDER — HYDROXYZINE HCL 50 MG PO TABS: 50.0000 mg | ORAL_TABLET | Freq: Three times a day (TID) | ORAL | Status: DC | PRN

## 2014-08-09 MED ORDER — PREDNISONE 10 MG PO KIT: PACK | ORAL | Status: DC

## 2014-08-09 NOTE — ED Notes (Signed)
C/o chronic back pain onset 4 years; pain radiates to right leg Also reports anxiety; has been buying xanax off the streets Has been d/c from PCP Taking tyle/ibup w/no relief Alert, no signs of acute distress.

## 2014-08-09 NOTE — ED Provider Notes (Signed)
Lynn Wallace is a 43 y.o. female who presents to Urgent Care today for back pain and anxiety.  1) patient has back pain. She has right-sided neck pain radiating from her right back to her right knee. The pain is been present for many years and has not changed much. No recent injury. No weakness or numbness bowel bladder dysfunction or difficulty walking. Patient has tried multiple over-the-counter medications. She recently had a prescription for 6 oxycodone is on September 8. Patient denies any recent narcotic prescriptions. She does not have a primary care provider or a pain clinic.  2) anxiety: Patient has a self-reported medical history significant for panic disorder. She notes that Xanax is within the only thing in the past and that helped. She is buying Xanax is off the street now. She last took Xanax yesterday. She denies any history for significant withdrawal problem with Xanax. No SI/HI. She has multiple panic attacks per day. She does not want to go back to [place].    Past Medical History  Diagnosis Date  . Polysubstance abuse     including narcotics, cocaine, benzodiazepines,  and marijuana  . Psychiatric problem     multiple addmissons for detox  . 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 Chronic low back pain   . GERD (gastroesophageal reflux disease)   . Irritable bowel syndrome (IBS)   . Anxiety   . UTI (lower urinary tract infection)    History  Substance Use Topics  . Smoking status: Current Every Day Smoker    Types: Cigarettes  . Smokeless tobacco: Never Used     Comment: 1/2 pack or less  . Alcohol Use: No   ROS as above Medications: No current facility-administered medications for this encounter.   Current Outpatient Prescriptions  Medication Sig Dispense Refill  . acetaminophen (TYLENOL) 500 MG tablet Take 500 mg by mouth every 6 (six) hours as needed for mild pain.       . hydrOXYzine (ATARAX/VISTARIL) 50 MG tablet Take 1  tablet (50 mg total) by mouth 3 (three) times daily as needed for anxiety.  30 tablet  0  . PredniSONE 10 MG KIT 12 day dose pack po  1 kit  0    Exam:  BP 104/76  Pulse 76  Temp(Src) 98.6 F (37 C) (Oral)  Resp 16  SpO2 96%  LMP 10/05/2013 Gen: Well NAD HEENT: EOMI,  MMM Lungs: Normal work of breathing. CTABL Heart: RRR no MRG Abd: NABS, Soft. Nondistended, Nontender Exts: Brisk capillary refill, warm and well perfused.  Back: Nontender to spinal midline. Tender palpation right lumbar paraspinal. Negative straight leg raise test and Fadir test and Faber test bilaterally Low back range of motion slightly limited in flexion. Her extremity strength is intact bilaterally. Patient can stand on toes heels squat and get on and off exam table. She has a normal gait. Reflexes are normal and equal bilateral extremities. Sensation is intact throughout. Psych: Alert and oriented normal affect normal speech and thought patterns. No SI or HI. No hallucinations or delusions expressed.  No results found for this or any previous visit (from the past 24 hour(s)). No results found.  Assessment and Plan: 43 y.o. female with  1) chronic back pain: Chronic in nature. Cannot prescribe narcotics. Prednisone. Refer to sports medicine as needed. 2) anxiety: Unclear if patient has a panic attack disorder versus bipolar versus benzodiazepine withdrawal. She appears to be stable at this time. Offered  SSRI patient declines as she has had multiple different SSRIs in the past which have not worked. Patient agrees for hydroxyzine.  F/U with PCP  Discussed warning signs or symptoms. Please see discharge instructions. Patient expresses understanding.   This note was created using Systems analyst. Any transcription errors are unintended.    Gregor Hams, MD 08/09/14 [phone]

## 2014-08-09 NOTE — Discharge Instructions (Signed)
Thank you for coming in today. Take the prednisone as directed. Use hydroxyzine as needed for anxiety. Followup at the doctor's we talked about.  Come back or go to the emergency room if you notice new weakness new numbness problems walking or bowel or bladder problems. Go to the emergency room if you like hurting your self or others   Back Exercises These exercises may help you when beginning to rehabilitate your injury. Your symptoms may resolve with or without further involvement from your physician, physical therapist or athletic trainer. While completing these exercises, remember:   Restoring tissue flexibility helps normal motion to return to the joints. This allows healthier, less painful movement and activity.  An effective stretch should be held for at least 30 seconds.  A stretch should never be painful. You should only feel a gentle lengthening or release in the stretched tissue. STRETCH - Extension, Prone on Elbows   Lie on your stomach on the floor, a bed will be too soft. Place your palms about shoulder width apart and at the height of your head.  Place your elbows under your shoulders. If this is too painful, stack pillows under your chest.  Allow your body to relax so that your hips drop lower and make contact more completely with the floor.  Hold this position for __________ seconds.  Slowly return to lying flat on the floor. Repeat __________ times. Complete this exercise __________ times per day.  RANGE OF MOTION - Extension, Prone Press Ups   Lie on your stomach on the floor, a bed will be too soft. Place your palms about shoulder width apart and at the height of your head.  Keeping your back as relaxed as possible, slowly straighten your elbows while keeping your hips on the floor. You may adjust the placement of your hands to maximize your comfort. As you gain motion, your hands will come more underneath your shoulders.  Hold this position __________  seconds.  Slowly return to lying flat on the floor. Repeat __________ times. Complete this exercise __________ times per day.  RANGE OF MOTION- Quadruped, Neutral Spine   Assume a hands and knees position on a firm surface. Keep your hands under your shoulders and your knees under your hips. You may place padding under your knees for comfort.  Drop your head and point your tail bone toward the ground below you. This will round out your low back like an angry cat. Hold this position for __________ seconds.  Slowly lift your head and release your tail bone so that your back sags into a large arch, like an old horse.  Hold this position for __________ seconds.  Repeat this until you feel limber in your low back.  Now, find your "sweet spot." This will be the most comfortable position somewhere between the two previous positions. This is your neutral spine. Once you have found this position, tense your stomach muscles to support your low back.  Hold this position for __________ seconds. Repeat __________ times. Complete this exercise __________ times per day.  STRETCH - Flexion, Single Knee to Chest   Lie on a firm bed or floor with both legs extended in front of you.  Keeping one leg in contact with the floor, bring your opposite knee to your chest. Hold your leg in place by either grabbing behind your thigh or at your knee.  Pull until you feel a gentle stretch in your low back. Hold __________ seconds.  Slowly release your grasp and  repeat the exercise with the opposite side. Repeat __________ times. Complete this exercise __________ times per day.  STRETCH - Hamstrings, Standing  Stand or sit and extend your right / left leg, placing your foot on a chair or foot stool  Keeping a slight arch in your low back and your hips straight forward.  Lead with your chest and lean forward at the waist until you feel a gentle stretch in the back of your right / left knee or thigh. (When done  correctly, this exercise requires leaning only a small distance.)  Hold this position for __________ seconds. Repeat __________ times. Complete this stretch __________ times per day. STRENGTHENING - Deep Abdominals, Pelvic Tilt   Lie on a firm bed or floor. Keeping your legs in front of you, bend your knees so they are both pointed toward the ceiling and your feet are flat on the floor.  Tense your lower abdominal muscles to press your low back into the floor. This motion will rotate your pelvis so that your tail bone is scooping upwards rather than pointing at your feet or into the floor.  With a gentle tension and even breathing, hold this position for __________ seconds. Repeat __________ times. Complete this exercise __________ times per day.  STRENGTHENING - Abdominals, Crunches   Lie on a firm bed or floor. Keeping your legs in front of you, bend your knees so they are both pointed toward the ceiling and your feet are flat on the floor. Cross your arms over your chest.  Slightly tip your chin down without bending your neck.  Tense your abdominals and slowly lift your trunk high enough to just clear your shoulder blades. Lifting higher can put excessive stress on the low back and does not further strengthen your abdominal muscles.  Control your return to the starting position. Repeat __________ times. Complete this exercise __________ times per day.  STRENGTHENING - Quadruped, Opposite UE/LE Lift   Assume a hands and knees position on a firm surface. Keep your hands under your shoulders and your knees under your hips. You may place padding under your knees for comfort.  Find your neutral spine and gently tense your abdominal muscles so that you can maintain this position. Your shoulders and hips should form a rectangle that is parallel with the floor and is not twisted.  Keeping your trunk steady, lift your right hand no higher than your shoulder and then your left leg no higher than  your hip. Make sure you are not holding your breath. Hold this position __________ seconds.  Continuing to keep your abdominal muscles tense and your back steady, slowly return to your starting position. Repeat with the opposite arm and leg. Repeat __________ times. Complete this exercise __________ times per day. Document Released: 11/28/2005 Document Revised: 02/02/2012 Document Reviewed: 02/22/2009 [facility] Patient Information 2015 [place], [place]. This information is not intended to replace advice given to you by your health care provider. Make sure you discuss any questions you have with your health care provider.   Panic Attacks Panic attacks are sudden, short-livedsurges of severe anxiety, fear, or discomfort. They may occur for no reason when you are relaxed, when you are anxious, or when you are sleeping. Panic attacks may occur for a number of reasons:   Healthy people occasionally have panic attacks in extreme, life-threatening situations, such as war or natural disasters. Normal anxiety is a protective mechanism of the body that helps [place] react to danger (fight or flight response).  Panic  attacks are often seen with anxiety disorders, such as panic disorder, social anxiety disorder, generalized anxiety disorder, and phobias. Anxiety disorders cause excessive or uncontrollable anxiety. They may interfere with your relationships or other life activities.  Panic attacks are sometimes seen with other mental illnesses, such as depression and posttraumatic stress disorder.  Certain medical conditions, prescription medicines, and drugs of abuse can cause panic attacks. SYMPTOMS  Panic attacks start suddenly, peak within 20 minutes, and are accompanied by four or more of the following symptoms:  Pounding heart or fast heart rate (palpitations).  Sweating.  Trembling or shaking.  Shortness of breath or feeling smothered.  Feeling choked.  Chest pain or discomfort.  Nausea or  strange feeling in your stomach.  Dizziness, light-headedness, or feeling like you will faint.  Chills or hot flushes.  Numbness or tingling in your lips or hands and feet.  Feeling that things are not real or feeling that you are not yourself.  Fear of losing control or going crazy.  Fear of dying. Some of these symptoms can mimic serious medical conditions. For example, you may think you are having a heart attack. Although panic attacks can be very scary, they are not life threatening. DIAGNOSIS  Panic attacks are diagnosed through an assessment by your health care provider. Your health care provider will ask questions about your symptoms, such as where and when they occurred. Your health care provider will also ask about your medical history and use of alcohol and drugs, including prescription medicines. Your health care provider may order blood tests or other studies to rule out a serious medical condition. Your health care provider may refer you to a mental health professional for further evaluation. TREATMENT   Most healthy people who have one or two panic attacks in an extreme, life-threatening situation will not require treatment.  The treatment for panic attacks associated with anxiety disorders or other mental illness typically involves counseling with a mental health professional, medicine, or a combination of both. Your health care provider will help determine what treatment is best for you.  Panic attacks due to physical illness usually go away with treatment of the illness. If prescription medicine is causing panic attacks, talk with your health care provider about stopping the medicine, decreasing the dose, or substituting another medicine.  Panic attacks due to alcohol or drug abuse go away with abstinence. Some adults need professional help in order to stop drinking or using drugs. HOME CARE INSTRUCTIONS   Take all medicines as directed by your health care provider.    Schedule and attend follow-up visits as directed by your health care provider. It is important to keep all your appointments. SEEK MEDICAL CARE IF:  You are not able to take your medicines as prescribed.  Your symptoms do not improve or get worse. SEEK IMMEDIATE MEDICAL CARE IF:   You experience panic attack symptoms that are different than your usual symptoms.  You have serious thoughts about hurting yourself or others.  You are taking medicine for panic attacks and have a serious side effect. MAKE SURE YOU:  Understand these instructions.  Will watch your condition.  Will get help right away if you are not doing well or get worse. Document Released: 11/10/2005 Document Revised: 11/15/2013 Document Reviewed: 06/24/2013 [facility] Patient Information 2015 [place], [place]. This information is not intended to replace advice given to you by your health care provider. Make sure you discuss any questions you have with your health care provider.

## 2014-08-27 ENCOUNTER — Encounter (HOSPITAL_COMMUNITY): Payer: Self-pay | Admitting: Emergency Medicine

## 2014-08-27 ENCOUNTER — Emergency Department (HOSPITAL_COMMUNITY)
Admission: EM | Admit: 2014-08-27 | Discharge: 2014-08-27 | Disposition: A | Discharge status: Home / Self Care | Payer: No Typology Code available for payment source | Attending: Emergency Medicine | Admitting: Emergency Medicine

## 2014-08-27 ENCOUNTER — Emergency Department (HOSPITAL_COMMUNITY): Payer: No Typology Code available for payment source

## 2014-08-27 DIAGNOSIS — G8929 Other chronic pain: Secondary | ICD-10-CM | POA: Insufficient documentation

## 2014-08-27 DIAGNOSIS — J40 Bronchitis, not specified as acute or chronic: Secondary | ICD-10-CM | POA: Insufficient documentation

## 2014-08-27 DIAGNOSIS — K088 Other specified disorders of teeth and supporting structures: Secondary | ICD-10-CM | POA: Insufficient documentation

## 2014-08-27 DIAGNOSIS — Z720 Tobacco use: Secondary | ICD-10-CM | POA: Insufficient documentation

## 2014-08-27 DIAGNOSIS — K0889 Other specified disorders of teeth and supporting structures: Secondary | ICD-10-CM

## 2014-08-27 DIAGNOSIS — K029 Dental caries, unspecified: Secondary | ICD-10-CM | POA: Insufficient documentation

## 2014-08-27 DIAGNOSIS — Z87440 Personal history of urinary (tract) infections: Secondary | ICD-10-CM | POA: Insufficient documentation

## 2014-08-27 DIAGNOSIS — Z8719 Personal history of other diseases of the digestive system: Secondary | ICD-10-CM | POA: Insufficient documentation

## 2014-08-27 DIAGNOSIS — Z880 Allergy status to penicillin: Secondary | ICD-10-CM | POA: Insufficient documentation

## 2014-08-27 DIAGNOSIS — Z8659 Personal history of other mental and behavioral disorders: Secondary | ICD-10-CM | POA: Insufficient documentation

## 2014-08-27 MED ORDER — PREDNISONE 20 MG PO TABS: 40.0000 mg | ORAL_TABLET | Freq: Every day | ORAL | Status: DC

## 2014-08-27 MED ORDER — OXYCODONE-ACETAMINOPHEN 5-325 MG PO TABS: 1.0000 | ORAL_TABLET | Freq: Four times a day (QID) | ORAL | Status: DC | PRN

## 2014-08-27 MED ORDER — OXYCODONE-ACETAMINOPHEN 5-325 MG PO TABS
1.0000 | ORAL_TABLET | Freq: Once | ORAL | Status: AC
  Administered 2014-08-27: 1 via ORAL
  Filled 2014-08-27: qty 1

## 2014-08-27 MED ORDER — ALBUTEROL SULFATE HFA 108 (90 BASE) MCG/ACT IN AERS
2.0000 | INHALATION_SPRAY | RESPIRATORY_TRACT | Status: DC | PRN
  Administered 2014-08-27: 2 via RESPIRATORY_TRACT
  Filled 2014-08-27: qty 6.7

## 2014-08-27 NOTE — ED Notes (Signed)
Pt reports 2 day hx of dental pain in l/lower jaw, in back, headache pain and moist cough. Tx with OTC meds

## 2014-08-27 NOTE — ED Provider Notes (Signed)
Medical screening examination/treatment/procedure(s) were performed by non-physician practitioner and as supervising physician I was immediately available for consultation/collaboration.   EKG Interpretation None        Ephraim Hamburger, MD 08/27/14 1521

## 2014-08-27 NOTE — ED Provider Notes (Signed)
CSN: [id number]     Arrival date & time 08/27/14  1331 History  This chart was scribed for Alecia Lemming, PA-C, working with Ephraim Hamburger, MD by Marti Sleigh, ED Scribe. This patient was seen in room WTR8/WTR8 and the patient's care was started at 2:08 PM.    Chief Complaint  Patient presents with  . Dental Pain    2 day hx of dental pain in low back mouth  . Cough    3 day hx of moist cough   The history is provided by the patient. No language interpreter was used.   HPI Comments: Calleigh Lafontant is a 43 y.o. female who presents to the Emergency Department complaining of constant inferior posterior tooth pain that started three days ago. Pt states that she has a past hx of similar symptoms. Pt states that she has a HA as an associated symptom. Also pt endorses sinus and pulmonary congestion, coughing and SOB with occasional wheezing. She has been told she has early COPD in the past. Pt denies fever. Pt smokes.   Past Medical History  Diagnosis Date  . Polysubstance abuse     including narcotics, cocaine, benzodiazepines,  and marijuana  . Psychiatric problem     multiple addmissons for detox  . 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 Chronic low back pain   . GERD (gastroesophageal reflux disease)   . Irritable bowel syndrome (IBS)   . Anxiety   . UTI (lower urinary tract infection)    Past Surgical History  Procedure Laterality Date  . Tubal ligation    . Dilation and curettage of uterus     Family History  Problem Relation Age of Onset  . Coronary artery disease      Female 1st degree relative <60  . Lung cancer    . Stroke      M 1st degree relative <50  . Arthritis Mother    History  Substance Use Topics  . Smoking status: Current Every Day Smoker    Types: Cigarettes  . Smokeless tobacco: Never Used     Comment: 1/2 pack or less  . Alcohol Use: No   OB History   Grav Para Term Preterm Abortions TAB SAB Ect Mult Living   7  5 5  0 2 1 1   5      Review of Systems  Constitutional: Negative for fever.  HENT: Positive for congestion and dental problem. Negative for ear pain, facial swelling, sore throat and trouble swallowing.   Respiratory: Positive for cough, shortness of breath and wheezing. Negative for stridor.   Gastrointestinal: Negative for nausea and vomiting.  Musculoskeletal: Negative for neck pain.  Skin: Negative for color change.  Neurological: Positive for headaches.    Allergies  Doxycycline; Hydrocodone-acetaminophen; Ibuprofen; Penicillins; Tramadol; Cleocin; and Keflex  Home Medications   Prior to Admission medications   Medication Sig Start Date End Date Taking? Authorizing Provider  acetaminophen (TYLENOL) 650 MG CR tablet Take 1,950 mg by mouth every 8 (eight) hours as needed for pain.   Yes Historical Provider, MD   BP 108/78  Pulse 79  Temp(Src) 98.7 F (37.1 C) (Oral)  Resp 18  SpO2 96%  LMP 07/05/2014  Physical Exam  Nursing note and vitals reviewed. Constitutional: She is oriented to person, place, and time. She appears well-developed and well-nourished.  HENT:  Head: Normocephalic and atraumatic.  Right Ear: Tympanic membrane, external ear and ear canal normal.  Left Ear: Tympanic membrane, external ear and ear canal normal.  Nose: Nose normal. No mucosal edema or rhinorrhea.  Mouth/Throat: Uvula is midline, oropharynx is clear and moist and mucous membranes are normal. No trismus in the jaw. Abnormal dentition. Dental caries present. No dental abscesses or uvula swelling. No tonsillar abscesses.  Patient with L mandibular tooth pain and tenderness to palpation in area of L mandibular molars. Patient has cavity along base of tooth in question. No swelling or erythema noted on exam.  Eyes: Conjunctivae and EOM are normal. Pupils are equal, round, and reactive to light. Right eye exhibits no discharge. Left eye exhibits no discharge.  Neck: Normal range of motion. Neck  supple.  No neck swelling or Ludwig's angina  Cardiovascular: Normal rate, regular rhythm and normal heart sounds.   No murmur heard. Pulmonary/Chest: Effort normal and breath sounds normal. No respiratory distress. She has no wheezes. She has no rales.  Abdominal: Soft. Bowel sounds are normal. There is no tenderness.  Musculoskeletal: Normal range of motion.  Lymphadenopathy:    She has no cervical adenopathy.  Neurological: She is alert and oriented to person, place, and time.  Skin: Skin is warm and dry.  Psychiatric: She has a normal mood and affect. Her behavior is normal.    ED Course  Procedures   DIAGNOSTIC STUDIES: Oxygen Saturation is 96% on RA, adequate by my interpretation.    COORDINATION OF CARE: 2:13 PM [name] O2 checked on walking, and CXR ordered. Pt agreed to treatment plan.  Labs Review Labs Reviewed - No data to display  Imaging Review Dg Chest 2 View  08/27/2014   CLINICAL DATA:  Chest pain with cough and headache and shortness of breath ; history of tobacco use ; initial visit  EXAM: CHEST  2 VIEW  COMPARISON:  PA and lateral chest x-ray of January 06, 2010  FINDINGS: The lungs are mildly hyperinflated. There is no focal infiltrate. The interstitial markings are coarse bilaterally. The heart and pulmonary vascularity are normal. The mediastinum is normal in width. There is no pleural effusion. The bony thorax is unremarkable.  IMPRESSION: There is mild hyperinflation consistent with COPD or reactive airway disease and the patient's smoking history. There is no evidence of pneumonia. One cannot exclude acute bronchitis in the appropriate clinical setting.   Electronically Signed   By: David  Martinique   On: 08/27/2014 14:30     EKG Interpretation None      Vital signs reviewed and are as follows: BP 108/78  Pulse 86  Temp(Src) 98.7 F (37.1 C) (Oral)  Resp 18  SpO2 99%  LMP 07/05/2014  Chest x-ray performed and is negative for pneumonia. Patient informed  of possible COPD.  For dental pain: Patient given Percocet #6 for pain. She has a history of polysubstance abuse so limited prescription given. No concern for dental infection at this time. Referrals given.  Patient counseled on use of narcotic pain medications. Counseled not to combine these medications with others containing tylenol. Urged not to drink alcohol, drive, or perform any other activities that requires focus while taking these medications. The patient verbalizes understanding and agrees with the plan.  For cough: Will treat as bronchitis/COPD exacerbation with prednisone and albuterol inhaler. Patient has ambulated in the department with normal O2 saturation.  Patient urged to return with worsening symptoms or other concerns. Patient verbalized understanding and agrees with plan.    MDM   Final diagnoses:  Pain, dental  Bronchitis   Patient  with toothache. No fever. Exam unconcerning for Ludwig's angina or other deep tissue infection in neck.   As there is no facial swelling or gum findings, will not prescribe antibiotics at this time. Will treat with pain medication.    Cough: As above. Chest x-ray negative. Patient does not desaturate with ambulation. Will treat symptomatically.  I personally performed the services described in this documentation, which was scribed in my presence. The recorded information has been reviewed and is accurate.    Carlisle Cater, PA-C 08/27/14 [phone]

## 2014-08-27 NOTE — Discharge Instructions (Signed)
Please read and follow all provided instructions.  Your diagnoses today include:  1. Pain, dental   2. Bronchitis    The exam and treatment you received today has been provided on an emergency basis only. This is not a substitute for complete medical or dental care.  Tests performed today include:  Vital signs. See below for your results today.   X-ray - shows changes of COPD/bronchitis  Medications prescribed:   Percocet (oxycodone/acetaminophen) - narcotic pain medication  DO NOT drive or perform any activities that require you to be awake and alert because this medicine can make you drowsy. BE VERY CAREFUL not to take multiple medicines containing Tylenol (also called acetaminophen). Doing so can lead to an overdose which can damage your liver and cause liver failure and possibly death.   Prednisone - steroid medicine   It is best to take this medication in the morning to prevent sleeping problems. If you are diabetic, monitor your blood sugar closely and stop taking Prednisone if blood sugar is over 300. Take with food to prevent stomach upset.    Albuterol inhaler - medication that opens up your airway  Use inhaler as follows: 1-2 puffs with spacer every 4 hours as needed for wheezing, cough, or shortness of breath.   Take any prescribed medications only as directed.  Home care instructions:  Follow any educational materials contained in this packet.  Follow-up instructions: Please follow-up with your dentist for further evaluation of your symptoms.   Dental Assistance: See below for dental referrals  Return instructions:   Please return to the Emergency Department if you experience worsening symptoms.  Please return if you develop a fever, you develop more swelling in your face or neck, you have trouble breathing or swallowing food.  Please return if you have any other emergent concerns.  Additional Information:  Your vital signs today were: BP 108/78   Pulse  86   Temp(Src) 98.7 F (37.1 C) (Oral)   Resp 18   SpO2 99%   LMP 07/05/2014 If your blood pressure (BP) was elevated above 135/85 this visit, please have this repeated by your doctor within one month. -------------- Dental Care: Organization         Address  Phone  Notes  [facility] Department of [facility] [phone] Accepts children up to age 71 who are enrolled in [place] or [place]; pregnant women with a Medicaid card; and children who have applied for Medicaid or [place] Health Choice, but were declined, whose parents can pay a reduced fee at time of service.  [facility] Department of [facility]  [address], [place] [phone] Accepts children up to age 67 who are enrolled in [place] or [place]; pregnant women with a Medicaid card; and children who have applied for Medicaid or [place] Health Choice, but were declined, whose parents can pay a reduced fee at time of service.  [facility]  [place] [phone] Patients are seen by appointment only. Walk-ins are not accepted. [place] will see patients 37 years of age and older. Monday - Tuesday (8am-5pm) Most Wednesdays (8:30-5pm) $30 per visit, cash only  [facility]  [address], [facility] [phone] Patients are seen by appointment only. Walk-ins are not accepted. [place] will see patients 61 years of age and older. One Wednesday Evening (  Monthly: Volunteer Based).  $30 per visit, cash only  [place]  [phone] for adults; Children under age 44, call Graduate Pediatric Dentistry at [phone]. Children aged 47-14, please call [phone] to request a pediatric application.  Dental services are provided in all areas of dental care including fillings, crowns and bridges, complete and partial  dentures, implants, gum treatment, root canals, and extractions. Preventive care is also provided. Treatment is provided to both adults and children. Patients are selected via a lottery and there is often a waiting list.   [facility] [address], [place]  [phone] [email or web address]   [facility] [address] [place], [place] [phone], Ext. [phone] Second and Fourth Thursday of each month, opens at 6:30 AM; Clinic ends at 9 AM.  Patients are seen on a first-come first-served basis, and a limited number are seen during each clinic.   [facility]  [address] [place], [place] [phone]   Eligibility Requirements You must have lived in [place], [place], or [place] counties for at least the last three months.   You cannot be eligible for state or federal sponsored [employer], including [employer], [place], or [employer].   You generally cannot be eligible for healthcare insurance through your employer.    How to apply: Eligibility screenings are held every Tuesday and Wednesday afternoon from 1:00 pm until 4:00 pm. You do not need an appointment for the interview!  [facility] [address] [place], [place]   [place]  [place]  [place]  [phone]

## 2014-09-01 ENCOUNTER — Encounter (HOSPITAL_COMMUNITY): Payer: Self-pay | Admitting: Emergency Medicine

## 2014-09-01 ENCOUNTER — Encounter: Payer: Self-pay | Admitting: Family Medicine

## 2014-09-01 ENCOUNTER — Emergency Department (HOSPITAL_COMMUNITY)
Admission: EM | Admit: 2014-09-01 | Discharge: 2014-09-01 | Discharge status: Against Medical Advice | Payer: No Typology Code available for payment source | Attending: Emergency Medicine | Admitting: Emergency Medicine

## 2014-09-01 ENCOUNTER — Ambulatory Visit: Payer: No Typology Code available for payment source | Attending: Family Medicine | Admitting: Family Medicine

## 2014-09-01 VITALS — BP 103/70 | HR 98 | Temp 97.6°F | Resp 18 | Ht 62.0 in | Wt 209.0 lb

## 2014-09-01 DIAGNOSIS — F1910 Other psychoactive substance abuse, uncomplicated: Secondary | ICD-10-CM

## 2014-09-01 DIAGNOSIS — K089 Disorder of teeth and supporting structures, unspecified: Secondary | ICD-10-CM | POA: Insufficient documentation

## 2014-09-01 DIAGNOSIS — F419 Anxiety disorder, unspecified: Secondary | ICD-10-CM | POA: Insufficient documentation

## 2014-09-01 DIAGNOSIS — M545 Low back pain, unspecified: Secondary | ICD-10-CM

## 2014-09-01 DIAGNOSIS — J449 Chronic obstructive pulmonary disease, unspecified: Secondary | ICD-10-CM | POA: Insufficient documentation

## 2014-09-01 DIAGNOSIS — G8929 Other chronic pain: Secondary | ICD-10-CM | POA: Insufficient documentation

## 2014-09-01 DIAGNOSIS — R0602 Shortness of breath: Secondary | ICD-10-CM | POA: Insufficient documentation

## 2014-09-01 DIAGNOSIS — Z6838 Body mass index (BMI) 38.0-38.9, adult: Secondary | ICD-10-CM | POA: Insufficient documentation

## 2014-09-01 DIAGNOSIS — F17210 Nicotine dependence, cigarettes, uncomplicated: Secondary | ICD-10-CM | POA: Insufficient documentation

## 2014-09-01 DIAGNOSIS — Z765 Malingerer [conscious simulation]: Secondary | ICD-10-CM | POA: Insufficient documentation
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
## 2014-09-01 NOTE — ED Notes (Addendum)
Pt from home sts that she was seen here 6 days ago and dx with bronchitis. Pt was given abx and reports that she finished them today. Pt c/o still has productive cough, unable to sleep due to cough. Pt adds that she still has dental pain also. Pt adds that she is having chronic back pain also. Pt reports that she went to [place] and was told " you can't get pain medicine unless you're a cancer pt!" Pt complains that she does not want to go back out to the waiting room and wants pain meds now. Pt was advised that she will be seen in order and wait just as the other pts have waited today. Pt is disrespectful and not cooperative. Pt is A&O and in NAD

## 2014-09-01 NOTE — ED Notes (Signed)
Pt. Demanded to be seen by a Doctor and not just by a PA. Also demanded pain medicine asap.

## 2014-09-01 NOTE — ED Provider Notes (Signed)
Pt being loud an abusive in ED. Actually walking out of her room yelling as I was coming to see her. Ambulatory without apparent difficulty. Normal vitals.   [name], MD 09/01/14 2127

## 2014-09-01 NOTE — Progress Notes (Signed)
  CARE MANAGEMENT ED NOTE 09/01/2014  Patient:  Lynn Wallace, Lynn Wallace   Account Number:  [id number]  Date Initiated:  09/01/2014  Documentation initiated by:  Livia Snellen  Subjective/Objective Assessment:   Patient presents to Ed with chronic back and neck pain     Subjective/Objective Assessment Detail:   Patient noted to be in ED 10 times in last six months.     Action/Plan:   Patient left AMA   Action/Plan Detail:   Anticipated DC Date:  09/01/2014     Status Recommendation to Physician:   Result of Recommendation:    Other ED Services  Consult Working [place]  CM consult  Other  PCP issues    Choice offered to / List presented to:            Status of service:  Completed, signed off  ED Comments:   ED Comments Detail:  [facility] consulted regarding patient needing her orange card reactivated.  Also noted that patient does not have a pcp at this time.  EDCM went to ED to speak to patient, however informed by [facility], patient has left AMA.  EDCM will make referral to [facility] rep for assistance with orange card and finding a pcp.  No further EDCM needs at this time.

## 2014-09-01 NOTE — Progress Notes (Signed)
   Subjective:    Patient ID: Lynn Wallace, female    DOB: 09-01-1971, 43 y.o.   MRN: [id number] CC: ED f/u COPD and chronic low back pain, anxiety deferred to next visit.  HPI 43 yo F presents to establish care and discuss the following:  1. COPD: was informed in her earlier 34s that she had some lungs. Since taking prednisone breathing is not much better but some. Productive cough x one week. SOB. No CP. No fever. Smoking 2 PPD since age 47. Partner also smokes. Patient has cut down to 3-4 cigs per day since being seen in ED on 08/27/14.   2. Chronic low back pain: since 2012.   Patient walked out before the rest of the history could be obtained after I informed her that I could not and would not refill xanax and percocet today and offered mobic with PPI. She left stating she would go to [facility].   Soc hx: current smoker  Review of Systems As per HPI  GAD 7: score of 21- 3 down the line.     Objective:   Physical Exam BP 103/70  Pulse 98  Temp(Src) 97.6 F (36.4 C) (Oral)  Resp 18  Ht 5\' 2"  (1.575 m)  Wt 209 lb (94.802 kg)  BMI 38.22 kg/m2  SpO2 95%  LMP 08/27/2014 General appearance: alert, cooperative and no distress Patient walked out before the rest of the exam could be completed        Assessment & Plan:

## 2014-09-01 NOTE — Progress Notes (Signed)
Establish Care HFU anxiety, COPD

## 2014-09-01 NOTE — ED Notes (Signed)
Pt. Refused to get into gown. Pt. Doesn't want to see a PA and requesting pain meds.

## 2014-09-01 NOTE — ED Notes (Signed)
Pharm tech at bedside 

## 2014-09-02 ENCOUNTER — Encounter (HOSPITAL_BASED_OUTPATIENT_CLINIC_OR_DEPARTMENT_OTHER): Payer: Self-pay | Admitting: Emergency Medicine

## 2014-09-02 ENCOUNTER — Emergency Department (HOSPITAL_BASED_OUTPATIENT_CLINIC_OR_DEPARTMENT_OTHER)
Admission: EM | Admit: 2014-09-02 | Discharge: 2014-09-02 | Disposition: A | Discharge status: Home / Self Care | Payer: No Typology Code available for payment source | Attending: Emergency Medicine | Admitting: Emergency Medicine

## 2014-09-02 DIAGNOSIS — Z8719 Personal history of other diseases of the digestive system: Secondary | ICD-10-CM | POA: Insufficient documentation

## 2014-09-02 DIAGNOSIS — R05 Cough: Secondary | ICD-10-CM | POA: Insufficient documentation

## 2014-09-02 DIAGNOSIS — F419 Anxiety disorder, unspecified: Secondary | ICD-10-CM | POA: Insufficient documentation

## 2014-09-02 DIAGNOSIS — J209 Acute bronchitis, unspecified: Secondary | ICD-10-CM

## 2014-09-02 DIAGNOSIS — J440 Chronic obstructive pulmonary disease with acute lower respiratory infection: Secondary | ICD-10-CM | POA: Insufficient documentation

## 2014-09-02 DIAGNOSIS — K089 Disorder of teeth and supporting structures, unspecified: Secondary | ICD-10-CM

## 2014-09-02 DIAGNOSIS — M549 Dorsalgia, unspecified: Secondary | ICD-10-CM

## 2014-09-02 DIAGNOSIS — K088 Other specified disorders of teeth and supporting structures: Secondary | ICD-10-CM | POA: Insufficient documentation

## 2014-09-02 DIAGNOSIS — G8929 Other chronic pain: Secondary | ICD-10-CM

## 2014-09-02 DIAGNOSIS — Z7952 Long term (current) use of systemic steroids: Secondary | ICD-10-CM | POA: Insufficient documentation

## 2014-09-02 DIAGNOSIS — F1910 Other psychoactive substance abuse, uncomplicated: Secondary | ICD-10-CM | POA: Insufficient documentation

## 2014-09-02 DIAGNOSIS — Z87440 Personal history of urinary (tract) infections: Secondary | ICD-10-CM | POA: Insufficient documentation

## 2014-09-02 DIAGNOSIS — Z720 Tobacco use: Secondary | ICD-10-CM | POA: Insufficient documentation

## 2014-09-02 DIAGNOSIS — Z880 Allergy status to penicillin: Secondary | ICD-10-CM | POA: Insufficient documentation

## 2014-09-02 DIAGNOSIS — M545 Low back pain: Secondary | ICD-10-CM | POA: Insufficient documentation

## 2014-09-02 DIAGNOSIS — R0789 Other chest pain: Secondary | ICD-10-CM | POA: Insufficient documentation

## 2014-09-02 DIAGNOSIS — F319 Bipolar disorder, unspecified: Secondary | ICD-10-CM | POA: Insufficient documentation

## 2014-09-02 DIAGNOSIS — Z79899 Other long term (current) drug therapy: Secondary | ICD-10-CM | POA: Insufficient documentation

## 2014-09-02 HISTORY — DX: Bronchitis, not specified as acute or chronic: J40

## 2014-09-02 HISTORY — DX: Chronic obstructive pulmonary disease, unspecified: J44.9

## 2014-09-02 MED ORDER — AZITHROMYCIN 250 MG PO TABS: 250.0000 mg | ORAL_TABLET | Freq: Every day | ORAL | Status: DC
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
## 2014-09-02 NOTE — ED Notes (Signed)
EDP [place] reports she discussed d/c plan with pt. Pt not in room to receive d/c papers from RN. Pt not in lobby either

## 2014-09-02 NOTE — ED Notes (Signed)
Pt reports she has pain left upper "eye tooth"- also reports she was seen at [facility] last Sunday and was dx with copd and bronchitis- states was given steroid and inhaler- c/o pain in back and chest

## 2014-09-02 NOTE — Discharge Instructions (Signed)
Acute Bronchitis Bronchitis is when the airways that extend from the windpipe into the lungs get red, puffy, and painful (inflamed). Bronchitis often causes thick spit (mucus) to develop. This leads to a cough. A cough is the most common symptom of bronchitis. In acute bronchitis, the condition usually begins suddenly and goes away over time (usually in 2 weeks). Smoking, allergies, and asthma can make bronchitis worse. Repeated episodes of bronchitis may cause more lung problems. HOME CARE  Rest.  Drink enough fluids to keep your pee (urine) clear or pale yellow (unless you need to limit fluids as told by your doctor).  Only take over-the-counter or prescription medicines as told by your doctor.  Avoid smoking and secondhand smoke. These can make bronchitis worse. If you are a smoker, think about using nicotine gum or skin patches. Quitting smoking will help your lungs heal faster.  Reduce the chance of getting bronchitis again by:  Washing your hands often.  Avoiding people with cold symptoms.  Trying not to touch your hands to your mouth, nose, or eyes.  Follow up with your doctor as told. GET HELP IF: Your symptoms do not improve after 1 week of treatment. Symptoms include:  Cough.  Fever.  Coughing up thick spit.  Body aches.  Chest congestion.  Chills.  Shortness of breath.  Sore throat. GET HELP RIGHT AWAY IF:   You have an increased fever.  You have chills.  You have severe shortness of breath.  You have bloody thick spit (sputum).  You throw up (vomit) often.  You lose too much body fluid (dehydration).  You have a severe headache.  You faint. MAKE SURE YOU:   Understand these instructions.  Will watch your condition.  Will get help right away if you are not doing well or get worse. Document Released: 04/28/2008 Document Revised: 07/13/2013 Document Reviewed: 05/03/2013 [facility] Patient Information 2015 [place], [place]. This information is not  intended to replace advice given to you by your health care provider. Make sure you discuss any questions you have with your health care provider. Chronic Pain Chronic pain can be defined as pain that is off and on and lasts for 3-6 months or longer. Many things cause chronic pain, which can make it difficult to make a diagnosis. There are many treatment options available for chronic pain. However, finding a treatment that works well for you may require trying various approaches until the right one is found. Many people benefit from a combination of two or more types of treatment to control their pain. SYMPTOMS  Chronic pain can occur anywhere in the body and can range from mild to very severe. Some types of chronic pain include:  Headache.  Low back pain.  Cancer pain.  Arthritis pain.  Neurogenic pain. This is pain resulting from damage to nerves. People with chronic pain may also have other symptoms such as:  Depression.  Anger.  Insomnia.  Anxiety. DIAGNOSIS  Your health care provider will help diagnose your condition over time. In many cases, the initial focus will be on excluding possible conditions that could be causing the pain. Depending on your symptoms, your health care provider may order tests to diagnose your condition. Some of these tests may include:   Blood tests.   CT scan.   MRI.   X-rays.   Ultrasounds.   Nerve conduction studies.  You may need to see a specialist.  TREATMENT  Finding treatment that works well may take time. You may be referred to  a pain specialist. He or she may prescribe medicine or therapies, such as:   Mindful meditation or yoga.  Shots (injections) of numbing or pain-relieving medicines into the spine or area of pain.  Local electrical stimulation.  Acupuncture.   Massage therapy.   Aroma, color, light, or sound therapy.   Biofeedback.   Working with a physical therapist to keep from getting stiff.    Regular, gentle exercise.   Cognitive or behavioral therapy.   Group support.  Sometimes, surgery may be recommended.  HOME CARE INSTRUCTIONS   Take all medicines as directed by your health care provider.   Lessen stress in your life by relaxing and doing things such as listening to calming music.   Exercise or be active as directed by your health care provider.   Eat a healthy diet and include things such as vegetables, fruits, fish, and lean meats in your diet.   Keep all follow-up appointments with your health care provider.   Attend a support group with others suffering from chronic pain. SEEK MEDICAL CARE IF:   Your pain gets worse.   You develop a new pain that was not there before.   You cannot tolerate medicines given to you by your health care provider.   You have new symptoms since your last visit with your health care provider.  SEEK IMMEDIATE MEDICAL CARE IF:   You feel weak.   You have decreased sensation or numbness.   You lose control of bowel or bladder function.   Your pain suddenly gets much worse.   You develop shaking.  You develop chills.  You develop confusion.  You develop chest pain.  You develop shortness of breath.  MAKE SURE YOU:  Understand these instructions.  Will watch your condition.  Will get help right away if you are not doing well or get worse. Document Released: 08/02/2002 Document Revised: 07/13/2013 Document Reviewed: 05/06/2013 [facility] Patient Information 2015 [place], [place]. This information is not intended to replace advice given to you by your health care provider. Make sure you discuss any questions you have with your health care provider.

## 2014-09-03 NOTE — ED Provider Notes (Signed)
History/physical exam/procedure(s) were performed by non-physician practitioner and as supervising physician I was immediately available for consultation/collaboration. I have reviewed all notes and am in agreement with care and plan.   Shaune Pollack, MD 09/03/14 2352

## 2014-09-08 ENCOUNTER — Ambulatory Visit (INDEPENDENT_AMBULATORY_CARE_PROVIDER_SITE_OTHER): Payer: No Typology Code available for payment source | Admitting: Pulmonary Disease

## 2014-09-08 ENCOUNTER — Other Ambulatory Visit: Payer: No Typology Code available for payment source

## 2014-09-08 ENCOUNTER — Encounter: Payer: Self-pay | Admitting: Pulmonary Disease

## 2014-09-08 VITALS — BP 128/74 | HR 91 | Ht 62.0 in | Wt 218.0 lb

## 2014-09-08 DIAGNOSIS — F17200 Nicotine dependence, unspecified, uncomplicated: Secondary | ICD-10-CM

## 2014-09-08 DIAGNOSIS — M7989 Other specified soft tissue disorders: Secondary | ICD-10-CM

## 2014-09-08 DIAGNOSIS — Z720 Tobacco use: Secondary | ICD-10-CM

## 2014-09-08 DIAGNOSIS — R0602 Shortness of breath: Secondary | ICD-10-CM

## 2014-09-08 DIAGNOSIS — F1910 Other psychoactive substance abuse, uncomplicated: Secondary | ICD-10-CM

## 2014-09-08 DIAGNOSIS — J411 Mucopurulent chronic bronchitis: Secondary | ICD-10-CM

## 2014-09-08 DIAGNOSIS — Z765 Malingerer [conscious simulation]: Secondary | ICD-10-CM

## 2014-09-08 MED ORDER — AEROCHAMBER MV MISC: Status: DC

## 2014-09-08 NOTE — Progress Notes (Signed)
Subjective:    Patient ID: Lynn Wallace, female    DOB: 11-Aug-1971, 43 y.o.   MRN: [id number]  HPI Chief Complaint  Patient presents with  . Advice Only    Self referred for COPD.  Pt was dx'ed with COPD last week in [facility].     Lynn Wallace said that she has been coughing and is short of breath.  She has been experiencing this for several weeks now, around three weeks ago.  Her boyfriend was sick and she caught a cold from him and she started having trouble breathing and was coughing and was much more anxious.  She went to the ED and was told that she had COPD and bronchitis.  She has smoked for years. She has smoked as much as 2 packs per day since age 1.  Recently she has cut back significantly to only 4 cigarettes a day and is using vapes.    She does not have frequent bouts of bronchitis.  However in the last 6 months she has been having more shortness of breath.  Walking makes her more short of breath.  When she sweeps or vacuums or mops she feels more short of breath.  Climbing a flight of stairs makes her more short of breath.  She has gained weight in the last year and has maybe gained as much as 38 pounds.    She had mild asthma as a kid never treated with medication.   Review of Systems  Constitutional: Negative for fever and unexpected weight change.  HENT: Positive for congestion and trouble swallowing. Negative for dental problem, ear pain, nosebleeds, postnasal drip, rhinorrhea, sinus pressure, sneezing and sore throat.   Eyes: Negative for redness and itching.  Respiratory: Positive for cough and shortness of breath. Negative for chest tightness and wheezing.   Cardiovascular: Negative for palpitations and leg swelling.  Gastrointestinal: Negative for nausea and vomiting.  Genitourinary: Negative for dysuria.  Musculoskeletal: Negative for joint swelling.  Skin: Negative for rash.  Neurological: Negative for headaches.  Hematological: Does not bruise/bleed  easily.  Psychiatric/Behavioral: Negative for dysphoric mood. The patient is not nervous/anxious.        Objective:   Physical Exam Filed Vitals:   09/08/14 1538  BP: 128/74  Pulse: 91  Height: 5\' 2"  (1.575 m)  Weight: 218 lb (98.884 kg)  SpO2: 96%  RA  Gen: well appearing, no acute distress HEENT: NCAT, PERRL, EOMi, OP clear, neck supple without masses PULM: CTA B CV: RRR, no mgr, no JVD AB: BS+, soft, nontender, no hsm Ext: warm, no edema, no clubbing, no cyanosis Derm: no rash or skin breakdown Neuro: A&Ox4, CN II-XII intact, strength 5/5 in all 4 extremities      Assessment & Plan:   Chronic bronchitis, mucopurulent COPD: GOLD Grade B or C, cannot quantify obstruction due to likely restriction Combined recommendations from the [employer], [employer] of Chest Physicians, Investment banker, corporate, European Respiratory Society (Qaseem A et al, Ann Intern Med. 2011;155(3):179) recommends tobacco cessation, pulmonary rehab (for symptomatic patients with an FEV1 < 50% predicted), supplemental oxygen (for patients with SaO2 <88% or paO2 <55), and appropriate bronchodilator therapy.  In regards to long acting bronchodilators, they recommend monotherapy (FEV1 60-80% with symptoms weak evidence, FEV1 with symptoms <60% strong evidence), or combination therapy (FEV1 <60% with symptoms, strong recommendation, moderate evidence).  One should also provide patients with annual immunizations and consider therapy for prevention of COPD exacerbations (ie. roflumilast or azithromycin)  when appopriate.  -O2 therapy: n/a -Immunizations: patient refused -Tobacco use: advised at length to quit -Exercise: encouraged exercise and weight loss -Bronchodilator therapy: start Dulera 2 puffs bid, samples given -Exacerbation prevention: dulera, quit smoking   Tobacco use disorder Advised at length to quit and to call 1800QUITNOW for free nicotine replacement from the  state.  Drug-seeking behavior I refused to fill xanax Rx per her request as treatment of her anxiety is outside my specialty.  Instructed her to discuss with PCP.  Leg swelling She had minor left leg swelling and discomfort which was slightly evident on exam.  Plan: -d-dimer -if d-dimer positive will need LE doppler ultrasound    Updated Medication List Outpatient Encounter Prescriptions as of 09/08/2014  Medication Sig  . acetaminophen (TYLENOL) 650 MG CR tablet Take 1,950 mg by mouth every 8 (eight) hours as needed for pain (back pain).   Marland Kitchen albuterol (PROVENTIL HFA;VENTOLIN HFA) 108 (90 BASE) MCG/ACT inhaler Inhale into the lungs every 6 (six) hours as needed for wheezing or shortness of breath.  . oxyCODONE-acetaminophen (PERCOCET/ROXICET) 5-325 MG per tablet Take 2 tablets by mouth every 4 (four) hours as needed for severe pain (back pain).  . Spacer/Aero-Holding Chambers (AEROCHAMBER MV) inhaler Use as instructed  . [DISCONTINUED] azithromycin (ZITHROMAX) 250 MG tablet Take 1 tablet (250 mg total) by mouth daily. Take first 2 tablets together, then 1 every day until finished.  . [DISCONTINUED] ibuprofen (ADVIL,MOTRIN) 200 MG tablet Take 400 mg by mouth every 6 (six) hours as needed for moderate pain (back pain).  . [DISCONTINUED] predniSONE (DELTASONE) 20 MG tablet Take 2 tablets (40 mg total) by mouth daily.

## 2014-09-08 NOTE — Patient Instructions (Signed)
Use Dulera 2 puffs twice a day no matter how you feel, take it with a spacer Take albuterol 2 puffs every four hours as needed for shortness of breath Exercise regularly and quit smoking We will see you back in 3 months or sooner if needed

## 2014-09-08 NOTE — Assessment & Plan Note (Signed)
She had minor left leg swelling and discomfort which was slightly evident on exam.  Plan: -d-dimer -if d-dimer positive will need LE doppler ultrasound

## 2014-09-08 NOTE — Assessment & Plan Note (Signed)
COPD: GOLD Grade B or C, cannot quantify obstruction due to likely restriction Combined recommendations from the [employer], [employer], Investment banker, corporate, [place] (Qaseem A et al, Ann Intern Med. 2011;155(3):179) recommends tobacco cessation, pulmonary rehab (for symptomatic patients with an FEV1 < 50% predicted), supplemental oxygen (for patients with SaO2 <88% or paO2 <55), and appropriate bronchodilator therapy.  In regards to long acting bronchodilators, they recommend monotherapy (FEV1 60-80% with symptoms weak evidence, FEV1 with symptoms <60% strong evidence), or combination therapy (FEV1 <60% with symptoms, strong recommendation, moderate evidence).  One should also provide patients with annual immunizations and consider therapy for prevention of COPD exacerbations (ie. roflumilast or azithromycin) when appopriate.  -O2 therapy: n/a -Immunizations: patient refused -Tobacco use: advised at length to quit -Exercise: encouraged exercise and weight loss -Bronchodilator therapy: start Dulera 2 puffs bid, samples given -Exacerbation prevention: dulera, quit smoking

## 2014-09-08 NOTE — Assessment & Plan Note (Signed)
I refused to fill xanax Rx per her request as treatment of her anxiety is outside my specialty.  Instructed her to discuss with PCP.

## 2014-09-08 NOTE — Addendum Note (Signed)
Addended by: Len Blalock on: 09/08/2014 04:57 PM   Modules accepted: Orders

## 2014-09-08 NOTE — Assessment & Plan Note (Signed)
Advised at length to quit and to call 1800QUITNOW for free nicotine replacement from the state.

## 2014-09-09 LAB — D-DIMER, QUANTITATIVE (NOT AT ARMC): D DIMER QUANT: 0.29 ug{FEU}/mL (ref 0.00–0.48)

## 2014-09-12 NOTE — Progress Notes (Signed)
Quick Note:  Spoke with pt, she is aware of results and recs. ______

## 2014-09-15 ENCOUNTER — Telehealth: Payer: Self-pay | Admitting: Family Medicine

## 2014-09-15 ENCOUNTER — Telehealth: Payer: Self-pay | Admitting: *Deleted

## 2014-09-15 DIAGNOSIS — J411 Mucopurulent chronic bronchitis: Secondary | ICD-10-CM

## 2014-09-15 MED ORDER — PREDNISONE 50 MG PO TABS: 50.0000 mg | ORAL_TABLET | Freq: Every day | ORAL | Status: DC

## 2014-09-15 MED ORDER — AZITHROMYCIN 500 MG PO TABS: 500.0000 mg | ORAL_TABLET | Freq: Every day | ORAL | Status: DC

## 2014-09-15 NOTE — Telephone Encounter (Signed)
Patient called in [place] spoke to patient. She is having SOB and congestion. She has COPD Sent in azithromycin and prednisone per orders. Patient will need f/u appt if symptoms persist.

## 2014-09-15 NOTE — Telephone Encounter (Signed)
Pt. Called stating that she went to the ED, ED stated that she had bronchitis and COPD, pt. States that she does not feel well, pt. Is also congested and is SOB, pt. Would like to get some medication to treat. Please f/u with pt.

## 2014-09-15 NOTE — Telephone Encounter (Signed)
Pt stated has cough and congestion Rx Prednisone and Zithromax was e-scribe to Pharmacy

## 2014-09-25 ENCOUNTER — Encounter: Payer: Self-pay | Admitting: Pulmonary Disease

## 2014-11-09 ENCOUNTER — Ambulatory Visit (INDEPENDENT_AMBULATORY_CARE_PROVIDER_SITE_OTHER): Payer: Self-pay | Admitting: Physician Assistant

## 2014-11-09 ENCOUNTER — Encounter (HOSPITAL_COMMUNITY): Payer: Self-pay | Admitting: Physician Assistant

## 2014-11-09 ENCOUNTER — Encounter (INDEPENDENT_AMBULATORY_CARE_PROVIDER_SITE_OTHER): Payer: Self-pay

## 2014-11-09 VITALS — BP 118/72 | HR 75 | Ht 62.0 in | Wt 221.0 lb

## 2014-11-09 DIAGNOSIS — F1911 Other psychoactive substance abuse, in remission: Secondary | ICD-10-CM

## 2014-11-09 DIAGNOSIS — F411 Generalized anxiety disorder: Secondary | ICD-10-CM

## 2014-11-09 DIAGNOSIS — F17210 Nicotine dependence, cigarettes, uncomplicated: Secondary | ICD-10-CM

## 2014-11-09 MED ORDER — ESCITALOPRAM OXALATE 10 MG PO TABS: 10.0000 mg | ORAL_TABLET | Freq: Every day | ORAL | Status: DC

## 2014-11-09 NOTE — Patient Instructions (Signed)
1. Continue all medication as ordered. 2. Call this office if you have any questions or concerns. 3. Continue to get regular exercise 3-5 times a week. 4. Continue to eat a healthy nutritionally balanced diet. 5. Continue to reduce stress and anxiety through activities such as yoga, mindfulness, meditation and or prayer. 6. Keep all appointments with your out patient therapist and have notes forwarded to this office. (If you do not have one and would like to be scheduled with a therapist, please let our office assist you with this. 7. Follow up as planned 3 weeks. 
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
## 2015-01-05 ENCOUNTER — Encounter: Payer: Self-pay | Admitting: Pulmonary Disease

## 2015-01-05 ENCOUNTER — Telehealth: Payer: Self-pay | Admitting: Pulmonary Disease

## 2015-01-05 ENCOUNTER — Ambulatory Visit (INDEPENDENT_AMBULATORY_CARE_PROVIDER_SITE_OTHER): Payer: Self-pay | Admitting: Pulmonary Disease

## 2015-01-05 VITALS — BP 134/68 | HR 79 | Ht 62.0 in | Wt 232.0 lb

## 2015-01-05 DIAGNOSIS — J411 Mucopurulent chronic bronchitis: Secondary | ICD-10-CM

## 2015-01-05 DIAGNOSIS — R079 Chest pain, unspecified: Secondary | ICD-10-CM

## 2015-01-05 DIAGNOSIS — R072 Precordial pain: Secondary | ICD-10-CM

## 2015-01-05 DIAGNOSIS — T732XXA Exhaustion due to exposure, initial encounter: Secondary | ICD-10-CM

## 2015-01-05 MED ORDER — ALBUTEROL SULFATE HFA 108 (90 BASE) MCG/ACT IN AERS: 2.0000 | INHALATION_SPRAY | Freq: Four times a day (QID) | RESPIRATORY_TRACT | Status: AC | PRN

## 2015-01-05 MED ORDER — MOMETASONE FURO-FORMOTEROL FUM 200-5 MCG/ACT IN AERO: 2.0000 | INHALATION_SPRAY | Freq: Two times a day (BID) | RESPIRATORY_TRACT | Status: DC

## 2015-01-05 NOTE — Progress Notes (Signed)
Subjective:    Patient ID: Lynn Wallace, female    DOB: 15-Nov-1971, 44 y.o.   MRN: [id number]  Synopsis: Lynn Wallace is an obese smoker who presented to the [facility] pulmonary clinic in October 2015 for evaluation of Gold grade B COPD. Simple spirometry at that time showed an FEV1 to FVC ratio of 70% with clear airflow obstruction and the flow volume loop, but the FVC was about 63% predicted likely due to obesity airflow obstruction cannot be quantified.   HPI  Chief Complaint  Patient presents with  . Follow-up    pt c/o increased sob, swelling in feet/legs/hands since last saturday. No med change or injury to attribute this to.     Lynn Wallace has beeen feeling more short of breath recently.  She last two weeks have been worse.  She has been swelling more in the last few weeks too.  She has not been coughing or wheezing as much lately.  She she says that she has noticed some mild back pain recently.  She has not been using the dulera.  She continues to smoke a cigarette every now and then.  She has gained weight since trying to quit smoking.  She has occassional chest pain.  She can't lay flat due to pain, but she denies orthopnea.  She says that her boyfriend notes that she quits breathing during the daytime.  She has a lot of fatigue in the daytime.  She also states that she has been experiencing substernal chest pressure off and on for the last several months. She says that it typically occurs at night while she is resting but it will occasionally occur on exertion. Last for a few minutes at a time. Additionally, she states that she has been quite fatigued recently. She snores. She says that her boyfriend has to wake her up multiple times throughout the course of the evening because she "quits breathing". She has never fallen asleep on the wheel of a car.  Past Medical History  Diagnosis Date  . Psychiatric problem     multiple addmissons for detox  . Bipolar disorder 2012    Probable,  and possible personality disorder   . Chronic low back pain 2012  . GERD (gastroesophageal reflux disease)   . Irritable bowel syndrome (IBS) 2012  . UTI (lower urinary tract infection)   . Anxiety 2013  . Polysubstance abuse 1990-2008    including narcotics, cocaine, benzodiazepines,  and marijuana  . Bronchitis   . COPD (chronic obstructive pulmonary disease)        Review of Systems  Constitutional: Positive for fatigue. Negative for fever and chills.  HENT: Negative for postnasal drip, rhinorrhea and sinus pressure.   Respiratory: Positive for cough, shortness of breath and wheezing.   Cardiovascular: Positive for chest pain and leg swelling. Negative for palpitations.       Objective:   Physical Exam Filed Vitals:   01/05/15 1341  BP: 134/68  Pulse: 79  Height: 5\' 2"  (1.575 m)  Weight: 232 lb (105.235 kg)  SpO2: 97%   Room air  Gen: Obese, no acute distress, no acute distress HEENT: NCAT,  EOMi, OP clear, PULM: wheezes bilaterally, good air movement CV: RRR, no mgr, no JVD AB: BS+, soft, nontender,  Ext: warm, ankle edema noted, no clubbing, no cyanosis Derm: no rash or skin breakdown Neuro: A&Ox4, MAEW  08/2014 Simple spirometry> Ratio 70%, clear airflow obstruction, FVC 63% pred 08/2014 CXR images reviewed> Hyperinflation, no clear infiltrate  Assessment & Plan:   GOLD GRADE B COPD Lynn Wallace has been noncompliant with her medications and she continues to smoke cigarettes so it is no surprise that she remain symptomatic. She has clear airflow obstruction consistent with COPD but she cannot quantify the degree of obstruction considering her concomitant restrictive lung disease which is most likely secondary to her obesity.  She claims that she is taking the medication we prescribed, but she has never filled the [facility] I prescribed and is only using the sample I gave her 3 months ago. So it's not possible for her to have been using it regularly.  She is  requesting disability for her COPD. I do not think this is reasonable because with weight loss, quitting smoking, and compliance with medications I think she could be feeling much better. However, she has made no effort in any of these areas.  Plan: -Today I discussed the importance of regular use of the Dulera -Use albuterol on an as-needed basis -Quit smoking -Exercise -Lose weight   Fatigue She is obese, has COPD, and describes daytime somnolence. She has never fallen asleep behind the wheel of a car. She says that her boyfriend has noticed that she quits breathing multiple times per night. She is a very high likelihood of having obstructive sleep apnea. This carries high risk of cardiac arrhythmia such as A. fib and other cardiovascular morbidity and mortality.  Plan: -Polysomnogram -No driving while sleepy   Chest pain I am concerned about the substernal chest pressure Lynn Wallace states she has experienced off and on in the last several months. It sounds like this occurs mostly at rest and not necessarily with fatigue which is somewhat reassuring. However, she is obese, has a strong family history of cardiac disease, and smokes cigarettes to her risk of ischemic cardiac disease it is exceedingly high.  Plan: -Nuclear stress test to assess for cardiac ischemia     Updated Medication List Outpatient Encounter Prescriptions as of 01/05/2015  Medication Sig  . acetaminophen (TYLENOL) 650 MG CR tablet Take 1,950 mg by mouth every 8 (eight) hours as needed for pain (back pain).   Marland Kitchen albuterol (PROVENTIL HFA;VENTOLIN HFA) 108 (90 BASE) MCG/ACT inhaler Inhale 2 puffs into the lungs every 6 (six) hours as needed for wheezing or shortness of breath.  . furosemide (LASIX) 20 MG tablet Take 20 mg by mouth 2 (two) times daily.  Marland Kitchen oxyCODONE-acetaminophen (PERCOCET/ROXICET) 5-325 MG per tablet Take 2 tablets by mouth every 4 (four) hours as needed for severe pain (back pain).  .  Spacer/Aero-Holding Chambers (AEROCHAMBER MV) inhaler Use as instructed  . [DISCONTINUED] albuterol (PROVENTIL HFA;VENTOLIN HFA) 108 (90 BASE) MCG/ACT inhaler Inhale into the lungs every 6 (six) hours as needed for wheezing or shortness of breath.  . mometasone-formoterol (DULERA) 200-5 MCG/ACT AERO Inhale 2 puffs into the lungs 2 (two) times daily.  . [DISCONTINUED] azithromycin (ZITHROMAX) 500 MG tablet Take 1 tablet (500 mg total) by mouth daily.  . [DISCONTINUED] escitalopram (LEXAPRO) 10 MG tablet Take 1 tablet (10 mg total) by mouth daily.  . [DISCONTINUED] predniSONE (DELTASONE) 50 MG tablet Take 1 tablet (50 mg total) by mouth daily with breakfast.

## 2015-01-05 NOTE — Telephone Encounter (Signed)
Called and spoke to pt. Pt requesting Dulera and proventil samples, we have neither. Pt aware. Pt also requested BQ write a letter to be sent to social services to renew pt's food stamps stating pt is unable to do basic job duties d/t her COPD, making her disabled.   BQ please advise.

## 2015-01-05 NOTE — Patient Instructions (Signed)
We will order a sleep study because of your fatigue and snoring Quit smoking Take Dulera two puffs twice a day no matter how you feel Use the Albuterol as needed for shortness of breath We will order a nuclear stress test because of the chest pain We will see you back in 6 months or sooner if needed

## 2015-01-06 ENCOUNTER — Telehealth: Payer: Self-pay | Admitting: Critical Care Medicine

## 2015-01-06 DIAGNOSIS — R079 Chest pain, unspecified: Secondary | ICD-10-CM | POA: Insufficient documentation

## 2015-01-06 DIAGNOSIS — R5383 Other fatigue: Secondary | ICD-10-CM | POA: Insufficient documentation

## 2015-01-06 NOTE — Telephone Encounter (Signed)
Pt called c/o severe edema and dyspnea i told pt to double lasix to 40mg  bid Pt will need ROV with Dr [place] Bells or TP this week to reassess and check labs

## 2015-01-06 NOTE — Assessment & Plan Note (Signed)
I am concerned about the substernal chest pressure Lynn Wallace states she has experienced off and on in the last several months. It sounds like this occurs mostly at rest and not necessarily with fatigue which is somewhat reassuring. However, she is obese, has a strong family history of cardiac disease, and smokes cigarettes to her risk of ischemic cardiac disease it is exceedingly high.  Plan: -Nuclear stress test to assess for cardiac ischemia

## 2015-01-06 NOTE — Telephone Encounter (Signed)
See my note from today.

## 2015-01-06 NOTE — Assessment & Plan Note (Signed)
Lynn Wallace has been noncompliant with her medications and she continues to smoke cigarettes so it is no surprise that she remain symptomatic. She has clear airflow obstruction consistent with COPD but she cannot quantify the degree of obstruction considering her concomitant restrictive lung disease which is most likely secondary to her obesity.  She claims that she is taking the medication we prescribed, but she has never filled the [facility] I prescribed and is only using the sample I gave her 3 months ago. So it's not possible for her to have been using it regularly.  She is requesting disability for her COPD. I do not think this is reasonable because with weight loss, quitting smoking, and compliance with medications I think she could be feeling much better. However, she has made no effort in any of these areas.  Plan: -Today I discussed the importance of regular use of the Dulera -Use albuterol on an as-needed basis -Quit smoking -Exercise -Lose weight

## 2015-01-06 NOTE — Assessment & Plan Note (Addendum)
She is obese, has COPD, and describes daytime somnolence. She has never fallen asleep behind the wheel of a car. She says that her boyfriend has noticed that she quits breathing multiple times per night. She is a very high likelihood of having obstructive sleep apnea. This carries high risk of cardiac arrhythmia such as A. fib and other cardiovascular morbidity and mortality.  Plan: -Polysomnogram -No driving while sleepy

## 2015-01-08 NOTE — Telephone Encounter (Signed)
Will forward message to [facility] to send the note to the lawyer.

## 2015-01-08 NOTE — Telephone Encounter (Signed)
She is requesting disability for her COPD. I do not think this is reasonable because with weight loss, quitting smoking, and compliance with medications I think she could be feeling much better. However, she has made no effort in any of these areas.   Called and spoke to pt. Informed pt of the recs per BQ. Pt requested OV note be faxed to lawyer (lawyer's numbers have been given to [facility]). Informed pt the OV note will be faxed to lawyer. Disability lawyer's phone number: [phone]. After calling lawyer, I was left on hold for over 10 minutes. WCB to get fax number to fax last OV note to lawyer.   Pt also stated she called the on-call MD and was advised to make an appt with BQ or TP. Appt made with TP on 01/15/2015. Pt verbalized understanding and denied any further questions or concerns at this time. Disability lawyer's phone number: [phone].

## 2015-01-08 NOTE — Telephone Encounter (Signed)
Looks as though [facility] contacted patient and scheduled for patient to see TP on 01-15-15. Nothing further needed at this time.

## 2015-01-09 NOTE — Telephone Encounter (Signed)
This has been sent to [facility] office.  Nothing further needed.

## 2015-01-15 ENCOUNTER — Ambulatory Visit: Payer: Self-pay | Admitting: Adult Health

## 2015-01-16 ENCOUNTER — Ambulatory Visit (HOSPITAL_COMMUNITY): Payer: Self-pay

## 2015-01-23 ENCOUNTER — Encounter (HOSPITAL_COMMUNITY): Payer: Self-pay

## 2015-01-27 ENCOUNTER — Encounter (HOSPITAL_COMMUNITY): Payer: Self-pay | Admitting: Emergency Medicine

## 2015-01-27 ENCOUNTER — Emergency Department (HOSPITAL_COMMUNITY)
Admission: EM | Admit: 2015-01-27 | Discharge: 2015-01-27 | Disposition: A | Discharge status: Home / Self Care | Payer: Self-pay | Attending: Emergency Medicine | Admitting: Emergency Medicine

## 2015-01-27 ENCOUNTER — Emergency Department (HOSPITAL_COMMUNITY): Payer: Self-pay

## 2015-01-27 DIAGNOSIS — Z880 Allergy status to penicillin: Secondary | ICD-10-CM | POA: Insufficient documentation

## 2015-01-27 DIAGNOSIS — Z87440 Personal history of urinary (tract) infections: Secondary | ICD-10-CM | POA: Insufficient documentation

## 2015-01-27 DIAGNOSIS — Z8719 Personal history of other diseases of the digestive system: Secondary | ICD-10-CM | POA: Insufficient documentation

## 2015-01-27 DIAGNOSIS — F419 Anxiety disorder, unspecified: Secondary | ICD-10-CM | POA: Insufficient documentation

## 2015-01-27 DIAGNOSIS — M545 Low back pain: Secondary | ICD-10-CM | POA: Insufficient documentation

## 2015-01-27 DIAGNOSIS — G8929 Other chronic pain: Secondary | ICD-10-CM | POA: Insufficient documentation

## 2015-01-27 DIAGNOSIS — R609 Edema, unspecified: Secondary | ICD-10-CM | POA: Insufficient documentation

## 2015-01-27 DIAGNOSIS — Z720 Tobacco use: Secondary | ICD-10-CM | POA: Insufficient documentation

## 2015-01-27 DIAGNOSIS — Z7952 Long term (current) use of systemic steroids: Secondary | ICD-10-CM | POA: Insufficient documentation

## 2015-01-27 DIAGNOSIS — Z79899 Other long term (current) drug therapy: Secondary | ICD-10-CM | POA: Insufficient documentation

## 2015-01-27 DIAGNOSIS — F319 Bipolar disorder, unspecified: Secondary | ICD-10-CM | POA: Insufficient documentation

## 2015-01-27 DIAGNOSIS — R079 Chest pain, unspecified: Secondary | ICD-10-CM | POA: Insufficient documentation

## 2015-01-27 DIAGNOSIS — J449 Chronic obstructive pulmonary disease, unspecified: Secondary | ICD-10-CM | POA: Insufficient documentation

## 2015-01-27 LAB — BASIC METABOLIC PANEL
ANION GAP: 13 (ref 5–15)
BUN: 15 mg/dL (ref 6–23)
CO2: 29 mmol/L (ref 19–32)
Calcium: 9.7 mg/dL (ref 8.4–10.5)
Chloride: 92 mmol/L — ABNORMAL LOW (ref 96–112)
Creatinine, Ser: 0.73 mg/dL (ref 0.50–1.10)
GFR calc Af Amer: >90 mL/min (ref >90)
Glucose, Bld: 98 mg/dL (ref 70–99)
POTASSIUM: 4 mmol/L (ref 3.5–5.1)
Sodium: 134 mmol/L — ABNORMAL LOW (ref 135–145)

## 2015-01-27 LAB — URINALYSIS, ROUTINE W REFLEX MICROSCOPIC
Glucose, UA: NEGATIVE mg/dL
Hgb urine dipstick: NEGATIVE
KETONES UR: NEGATIVE mg/dL
LEUKOCYTES UA: NEGATIVE
NITRITE: NEGATIVE
Protein, ur: NEGATIVE mg/dL
Specific Gravity, Urine: 1.023 (ref 1.005–1.030)
UROBILINOGEN UA: 0.2 mg/dL (ref 0.0–1.0)
pH: 5 (ref 5.0–8.0)

## 2015-01-27 LAB — CBC WITH DIFFERENTIAL/PLATELET
BASOS PCT: 0 % (ref 0–1)
Basophils Absolute: 0 10*3/uL (ref 0.0–0.1)
Eosinophils Absolute: 0 10*3/uL (ref 0.0–0.7)
Eosinophils Relative: 1 % (ref 0–5)
HEMATOCRIT: 43.9 % (ref 36.0–46.0)
Hemoglobin: 14.8 g/dL (ref 12.0–15.0)
Lymphocytes Relative: 22 % (ref 12–46)
Lymphs Abs: 1.8 10*3/uL (ref 0.7–4.0)
MCH: 32.5 pg (ref 26.0–34.0)
MCHC: 33.7 g/dL (ref 30.0–36.0)
MCV: 96.3 fL (ref 78.0–100.0)
MONO ABS: 0.8 10*3/uL (ref 0.1–1.0)
MONOS PCT: 10 % (ref 3–12)
NEUTROS PCT: 67 % (ref 43–77)
Neutro Abs: 5.6 10*3/uL (ref 1.7–7.7)
Platelets: 258 10*3/uL (ref 150–400)
RBC: 4.56 MIL/uL (ref 3.87–5.11)
RDW: 12.9 % (ref 11.5–15.5)
WBC: 8.3 10*3/uL (ref 4.0–10.5)

## 2015-01-27 LAB — BRAIN NATRIURETIC PEPTIDE: B NATRIURETIC PEPTIDE 5: 38.2 pg/mL (ref 0.0–100.0)

## 2015-01-27 NOTE — Discharge Instructions (Signed)
Call your doctor to schedule your cardiac stress test as soon as possible. Follow-up with a primary care doctor for checkup in 3-5 days.     Edema Edema is an abnormal buildup of fluids in your bodytissues. Edema is somewhatdependent on gravity to pull the fluid to the lowest place in your body. That makes the condition more common in the legs and thighs (lower extremities). Painless swelling of the feet and ankles is common and becomes more likely as you get older. It is also common in looser tissues, like around your eyes.  When the affected area is squeezed, the fluid may move out of that spot and leave a dent for a few moments. This dent is called pitting.  CAUSES  There are many possible causes of edema. Eating too much salt and being on your feet or sitting for a long time can cause edema in your legs and ankles. Hot weather may make edema worse. Common medical causes of edema include:  Heart failure.  Liver disease.  Kidney disease.  Weak blood vessels in your legs.  Cancer.  An injury.  Pregnancy.  Some medications.  Obesity. SYMPTOMS  Edema is usually painless.Your skin may look swollen or shiny.  DIAGNOSIS  Your health care provider may be able to diagnose edema by asking about your medical history and doing a physical exam. You may need to have tests such as X-rays, an electrocardiogram, or blood tests to check for medical conditions that may cause edema.  TREATMENT  Edema treatment depends on the cause. If you have heart, liver, or kidney disease, you need the treatment appropriate for these conditions. General treatment may include:  Elevation of the affected body part above the level of your heart.  Compression of the affected body part. Pressure from elastic bandages or support stockings squeezes the tissues and forces fluid back into the blood vessels. This keeps fluid from entering the tissues.  Restriction of fluid and salt intake.  Use of a water pill  (diuretic). These medications are appropriate only for some types of edema. They pull fluid out of your body and make you urinate more often. This gets rid of fluid and reduces swelling, but diuretics can have side effects. Only use diuretics as directed by your health care provider. HOME CARE INSTRUCTIONS   Keep the affected body part above the level of your heart when you are lying down.   Do not sit still or stand for prolonged periods.   Do not put anything directly under your knees when lying down.  Do not wear constricting clothing or garters on your upper legs.   Exercise your legs to work the fluid back into your blood vessels. This may help the swelling go down.   Wear elastic bandages or support stockings to reduce ankle swelling as directed by your health care provider.   Eat a low-salt diet to reduce fluid if your health care provider recommends it.   Only take medicines as directed by your health care provider. SEEK MEDICAL CARE IF:   Your edema is not responding to treatment.  You have heart, liver, or kidney disease and notice symptoms of edema.  You have edema in your legs that does not improve after elevating them.   You have sudden and unexplained weight gain. SEEK IMMEDIATE MEDICAL CARE IF:   You develop shortness of breath or chest pain.   You cannot breathe when you lie down.  You develop pain, redness, or warmth in the swollen  areas.   You have heart, liver, or kidney disease and suddenly get edema.  You have a fever and your symptoms suddenly get worse. MAKE SURE YOU:   Understand these instructions.  Will watch your condition.  Will get help right away if you are not doing well or get worse. Document Released: 11/10/2005 Document Revised: 03/27/2014 Document Reviewed: 09/02/2013 [facility] Patient Information 2015 [place], [place]. This information is not intended to replace advice given to you by your health care provider. Make sure you  discuss any questions you have with your health care provider.  Emergency Department Resource Guide 1) Find a Doctor and Pay Out of Pocket Although you won't have to find out who is covered by your insurance plan, it is a good idea to ask around and get recommendations. You will then need to call the office and see if the doctor you have chosen will accept you as a new patient and what types of options they offer for patients who are self-pay. Some doctors offer discounts or will set up payment plans for their patients who do not have insurance, but you will need to ask so you aren't surprised when you get to your appointment.  2) Contact Your Local Health Department Not all health departments have doctors that can see patients for sick visits, but many do, so it is worth a call to see if yours does. If you don't know where your local health department is, you can check in your phone book. The CDC also has a tool to help you locate your state's health department, and many state websites also have listings of all of their local health departments.  3) Find a [facility] If your illness is not likely to be very severe or complicated, you may want to try a walk in clinic. These are popping up all over the country in pharmacies, drugstores, and shopping centers. They're usually staffed by nurse practitioners or physician assistants that have been trained to treat common illnesses and complaints. They're usually fairly quick and inexpensive. However, if you have serious medical issues or chronic medical problems, these are probably not your best option.  No Primary Care Doctor: - Call Health Connect at  [phone] - they can help you locate a primary care doctor that  accepts your insurance, provides certain services, etc. - Physician Referral Service- [phone]  Chronic Pain Problems: Organization         Address  Phone   Notes  [facility]  [phone] Patients need to be  referred by their primary care doctor.   Medication Assistance: Organization         Address  Phone   Notes  [facility] Medication [facility] [address] [place], [place] [address] [phone] --Must be a resident of [facility] -- Must have NO insurance coverage whatsoever (no Medicaid/ Medicare, etc.) -- The pt. MUST have a primary care doctor that directs their care regularly and follows them in the community   MedAssist  [phone]   [employer]  [phone]    Agencies that provide inexpensive medical care: Organization         Address  Phone   Notes  [place]  [phone]   [facility]    [phone]   [facility] [place], [place] [address] [phone]   [place] (  [phone]   Planned Parenthood    [phone]   [facility]    [phone]   [place] [address], [place] Phone:  [phone], Fax:  [phone] Hours of Operation:  9 am - 6 pm, M-F.  Also accepts Medicaid/Medicare and self-pay.  [facility] for [place] [address], [address], [place] Phone: [phone], Fax: [phone]. Hours of Operation:  8:30 am - 5:30 pm, M-F.  Also accepts Medicaid and self-pay.  [facility] [place] [address] [place] Phone: [phone]   [place], [place], [place] [phone], Ext. [phone] Mondays & Thursdays: 7-9 AM.  First 15 patients are seen on a first come, first serve basis.    [place] Providers:  Organization         Address  Phone   Notes  [facility] [address], [place] [phone] Also accepts self-pay patients.  [facility] [address], [place]  [phone]   [place], [address], [place] [phone]   [facility] [address], [place] [phone]   Lucianne Lei [address], [place]   [phone] Only accepts [place] Access [place] patients after they have their name applied to their card.   Self-Pay (no insurance) in [facility]:  Organization         Address  Phone   Notes  Sickle Cell Patients, [facility] Internal [facility] [place] [phone]   [facility] [place] [phone]   [facility] [place]  [place], [place], [place] [phone]   [facility]/Dr. Osei-Bonsu  [address], [place] or [address], [place] [phone] Phone number for both [place] and [place] locations is the same.  [facility]gent [facility] and [facility] [address], [place] [phone]   [facility] [address] [place] or [address] [phone]   [facility] [address], [place] [phone], phone; [phone], fax Sees patients 1st and 3rd Saturday of every month.  Must not qualify for public or private insurance (i.e. Medicaid, Medicare, [place] Health Choice, Veterans' Benefits)  Household income should be no more than 200% of the poverty level The clinic cannot treat you if you are pregnant or think you are pregnant  Sexually transmitted diseases are not treated at the clinic.    Dental Care: Organization         Address  Phone  Notes  [facility] Department of [facility] [place] [phone] Accepts children up to age 60 who are enrolled in [place] or [place]; pregnant women with a Medicaid card; and children who have applied for Medicaid or [place] Health Choice, but were declined, whose parents can  pay a reduced fee at time of service.  [facility] Department of [facility]  [address], [place] [phone] Accepts children up to age 63 who are enrolled in [place] or [place]; pregnant women with a Medicaid card; and children who have applied for Medicaid or [place]  Choice, but were declined, whose parents can pay a reduced fee at time of service.  [place] Adult Dental Access PROGRAM  [place] [phone] Patients are seen by appointment only. Walk-ins are not accepted. [place] will see patients 72 years of age and older. Monday - Tuesday (8am-5pm) Most Wednesdays (8:30-5pm) $30 per visit, cash only  [facility] Adult Dental Access PROGRAM  [address], [facility] [phone] Patients are seen by appointment only. Walk-ins are not accepted. [place] will see patients 70 years of age and older. One Wednesday Evening (Monthly: Volunteer Based).  $30 per visit, cash only  [place]  [phone] for adults; Children under age 35, call Graduate Pediatric Dentistry at [phone]. Children aged 52-14, please call [phone] to request a pediatric application.  Dental services are provided in all areas of dental care including fillings, crowns and bridges, complete and partial dentures, implants, gum treatment, root canals, and extractions. Preventive care is also provided. Treatment is provided to both adults and children. Patients are selected via a lottery and there is often a waiting list.   [facility] [address] [place]  [phone] [email or web address]   [facility] [address] [place], [place] [phone], Ext. [phone] Second and Fourth Thursday of each month, opens at 6:30 AM; Clinic ends at 9 AM.  Patients are seen on a first-come first-served basis, and a limited number are seen during each clinic.   [facility]  [address] Hillard Danker [place], [place] [phone]   Eligibility Requirements You must have lived in [place], [place], or [place] counties for at least the last three months.   You cannot be eligible for state or federal sponsored [employer], including [employer], [place], or [employer].   You generally cannot be eligible for healthcare insurance through your employer.    How to apply: Eligibility screenings are held every Tuesday and Wednesday afternoon from 1:00 pm until 4:00 pm. You do not need an appointment for the interview!  [facility] [address] [place], [place]   [place]  [place] Department  [place]  [phone]    Behavioral Health Resources in the Community: Intensive Outpatient Programs Organization         Address  Phone  Notes  [facility]. [address], [place], [place] [phone]   [facility]patient [address], [place], [place]   ADS: Alcohol & Drug Svcs [address], [place], [place]   [place] [address]  [place], [place] or [phone]   Substance Abuse Resources Organization         Address  Phone  Notes  Alcohol and Drug Services  [phone]   [place]  [phone]   [facility] [place]   Chinita Pester  [phone]   Residential & Outpatient Substance Abuse Program  [phone]   Psychological Services Organization         Address  Phone  Notes  [facility] [place]  [place]  [phone]   [place] [address], [place] or [phone]    Mobile Crisis Teams Organization         Address  Phone  Notes  [facility], Mobile Crisis Care Unit  [phone]    Assertive Psychotherapeutic Services  [address] [place], [place]   [facility] [address] [place] [phone]    Self-Help/Support Groups Organization         Address  Phone             Notes  [place]. of [place] - variety of support groups  [place] Call for more information  Narcotics Anonymous (NA), Caring Services [address], [employer] [place]  2 meetings at this location   Special educational needs teacher         Address  Phone  Notes  [facility] Residential Treatment [place],    [place]  [phone]   [facility]  [address], [place] [address], [place], [place]   [place], [place] [phone] Admissions: 8am-3pm M-F  Incentives Substance [facility] [address],    [place], [place] [phone]   [facility] [address] [place], [place], [place]   [facility] [address]  [place], [place]   [facility] Programs - Intensive Outpatient [address] Kristeen [address], [place], [place]   [facility] [place] [facility] [place].,  [place], [place] [phone] or [phone]   [facility]idential Treatment Services (RTS) [address], [place], [place] Accepts Medicaid  [facility] [place] [address]  [place] [phone] Substance Abuse/Addiction Treatment   [facility] Organization         Address  Phone  Notes  [facility]  [phone]   Domenic Schwab, PhD [address] Arlis Porta [place], [place]   [phone] or [phone]   [place], [place] [phone]   [facility] [address], [place], [place] [phone] Insurance/Medicaid/sponsorship through [facility] [place] and Families [address], [address] [place]                                     [place], [place] [phone] [place] [address], [place] [phone]    Dr. Adele Schilder  [phone]   [facility] Dept. 1) [address], [place] 2) [place] 3)  [address], [place] [phone]  [phone]   [place] [phone] or [phone] (After Hours)

## 2015-01-27 NOTE — ED Notes (Signed)
Pt c/o bilat leg swelling form knees down that been going on for couple months.  Pt also c/o headache that started today with vomiting.  Pt denies visual problems.  Pt also c/o mid to lower back pain that is chronic.

## 2015-01-27 NOTE — ED Provider Notes (Signed)
CSN: [id number]     Arrival date & time 01/27/15  1911 History   First MD Initiated Contact with Patient 01/27/15 2102     Chief Complaint  Patient presents with  . Leg Swelling  . Headache  . Back Pain     (Consider location/radiation/quality/duration/timing/severity/associated sxs/prior Treatment) HPI   Lynn Wallace is a 44 y.o. female who presents for evaluation of "swelling all over." She also has ongoing chronic low back pain. She denies chest pain, currently. She saw her pulmonologist recently who recommended that she had a cardiac stress test done. She is not having that done yet. He recommended this test because she has had chronic, ongoing, intermittent chest pain. She does not have any known cardiac diagnoses. She has COPD. She denies fever, cough, nausea or vomiting. She has urinary frequency without dysuria. She is taking her usual medicines without relief. There are no other known modifying factors.   Past Medical History  Diagnosis Date  . Psychiatric problem     multiple addmissons for detox  . 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 Chronic low back pain 2012  . GERD (gastroesophageal reflux disease)   . Irritable bowel syndrome (IBS) 2012  . UTI (lower urinary tract infection)   . Anxiety 2013  . Polysubstance abuse 1990-2008    including narcotics, cocaine, benzodiazepines,  and marijuana  . Bronchitis   . COPD (chronic obstructive pulmonary disease)    Past Surgical History  Procedure Laterality Date  . Dilation and curettage of uterus  2000  . Tubal ligation  2001   Family History  Problem Relation Age of Onset  . Coronary artery disease      Female 1st degree relative <60  . Lung cancer Maternal Grandfather   . Stroke      M 1st degree relative <50  . Arthritis Mother   . Cancer Mother     cervical cancer   . Cancer Brother     throat cancer   . Heart disease Maternal Grandmother   . Heart disease  Maternal Grandfather   . Diabetes Maternal Grandfather   . Throat cancer Brother    History  Substance Use Topics  . Smoking status: Current Every Day Smoker -- 2.00 packs/day for 31 years    Types: Cigarettes  . Smokeless tobacco: Never Used     Comment: Trying to cut back, smoking 1 qod  . Alcohol Use: No   OB History    Gravida Para Term Preterm AB TAB SAB Ectopic Multiple Living   7 5 5  0 2 1 1   5      Review of Systems  All other systems reviewed and are negative.     Allergies  Doxycycline; Hydrocodone-acetaminophen; Ibuprofen; Penicillins; Tramadol; Cleocin; and Keflex  Home Medications   Prior to Admission medications   Medication Sig Start Date End Date Taking? Authorizing Provider  acetaminophen (TYLENOL) 650 MG CR tablet Take 1,950 mg by mouth every 8 (eight) hours as needed for pain (back pain).    Yes Historical Provider, MD  albuterol (PROVENTIL HFA;VENTOLIN HFA) 108 (90 BASE) MCG/ACT inhaler Inhale 2 puffs into the lungs every 6 (six) hours as needed for wheezing or shortness of breath. 01/05/15  Yes Juanito Doom, MD  ALPRAZolam Duanne Moron) 1 MG tablet Take 1 mg by mouth daily as needed for anxiety.   Yes Historical Provider, MD  furosemide (LASIX) 20 MG tablet Take 20 mg by mouth  2 (two) times daily.   Yes Historical Provider, MD  oxyCODONE-acetaminophen (PERCOCET/ROXICET) 5-325 MG per tablet Take 2 tablets by mouth every 4 (four) hours as needed for severe pain (back pain).   Yes Historical Provider, MD  mometasone-formoterol (DULERA) 200-5 MCG/ACT AERO Inhale 2 puffs into the lungs 2 (two) times daily. Patient not taking: Reported on 01/27/2015 01/05/15   Juanito Doom, MD  Spacer/Aero-Holding Chambers (AEROCHAMBER MV) inhaler Use as instructed Patient not taking: Reported on 01/27/2015 09/08/14   Juanito Doom, MD   BP 112/75 mmHg  Pulse 93  Temp(Src) 98.1 F (36.7 C) (Oral)  Resp 18  SpO2 100% Physical Exam  Constitutional: She is oriented to  person, place, and time. She appears well-developed.  Morbidly obese  HENT:  Head: Normocephalic and atraumatic.  Right Ear: External ear normal.  Left Ear: External ear normal.  Eyes: Conjunctivae and EOM are normal. Pupils are equal, round, and reactive to light.  Neck: Normal range of motion and phonation normal. Neck supple.  Cardiovascular: Normal rate, regular rhythm and normal heart sounds.   Pulmonary/Chest: Effort normal and breath sounds normal. She exhibits no bony tenderness.  Abdominal: Soft. There is no tenderness.  Musculoskeletal: Normal range of motion. She exhibits no edema or tenderness.  Neurological: She is alert and oriented to person, place, and time. No cranial nerve deficit or sensory deficit. She exhibits normal muscle tone. Coordination normal.  Skin: Skin is warm, dry and intact.  Psychiatric: She has a normal mood and affect. Her behavior is normal. Judgment and thought content normal.  Nursing note and vitals reviewed.   ED Course  Procedures (including critical care time)   Medications - No data to display  Patient Vitals for the past 24 hrs:  BP Temp Temp src Pulse Resp SpO2  01/27/15 2150 112/75 mmHg - - 93 18 100 %  01/27/15 1919 97/81 mmHg 98.1 F (36.7 C) Oral 100 19 99 %    10:47 PM Reevaluation with update and discussion. After initial assessment and treatment, an updated evaluation reveals no additional complaints. Findings discussed with the patient, all questions answered.Daleen Bo L     Labs Review Labs Reviewed  URINALYSIS, ROUTINE W REFLEX MICROSCOPIC - Abnormal; Notable for the following:    APPearance CLOUDY (*)    Bilirubin Urine SMALL (*)    All other components within normal limits  BASIC METABOLIC PANEL - Abnormal; Notable for the following:    Sodium 134 (*)    Chloride 92 (*)    All other components within normal limits  CBC WITH DIFFERENTIAL/PLATELET  BRAIN NATRIURETIC PEPTIDE    Imaging Review Dg Chest 2  View  01/27/2015   CLINICAL DATA:  Initial evaluation bilateral lakes swelling, headache  EXAM: CHEST  2 VIEW  COMPARISON:  08/27/2014  FINDINGS: Heart size and vascular pattern normal. Mild hyperinflation consistent with COPD. Mild bronchitic change. No infiltrate or effusion.  IMPRESSION: Mild chronic bronchitic change with no acute findings.   Electronically Signed   By: Skipper Cliche M.D.   On: 01/27/2015 22:31     EKG Interpretation None      MDM   Final diagnoses:  Swelling    Nonspecific sensation of swelling, without clear evidence for edema. Patient is very obese. No evidence for heart failure, metabolic instability, or serious bacterial infection.  Nursing Notes Reviewed/ Care Coordinated Applicable Imaging Reviewed Interpretation of Laboratory Data incorporated into ED treatment  The patient appears reasonably screened and/or stabilized for discharge  and I doubt any other medical condition or other [facility] requiring further screening, evaluation, or treatment in the ED at this time prior to discharge.  Plan: Home Medications- OTC analgesia; Home Treatments- rest; return here if the recommended treatment, does not improve the symptoms; Recommended follow up- PCP prn    Richarda Blade, MD 01/27/15 2328

## 2015-02-07 ENCOUNTER — Encounter (HOSPITAL_COMMUNITY): Payer: Self-pay

## 2015-02-22 ENCOUNTER — Encounter (HOSPITAL_COMMUNITY): Payer: Self-pay

## 2015-02-23 ENCOUNTER — Encounter (HOSPITAL_COMMUNITY): Payer: Self-pay

## 2015-02-28 ENCOUNTER — Encounter (HOSPITAL_COMMUNITY): Payer: Self-pay

## 2015-03-01 ENCOUNTER — Encounter (HOSPITAL_COMMUNITY): Payer: Self-pay

## 2015-03-06 ENCOUNTER — Encounter (HOSPITAL_COMMUNITY): Payer: Self-pay | Admitting: Emergency Medicine

## 2015-03-06 ENCOUNTER — Emergency Department (HOSPITAL_COMMUNITY)
Admission: EM | Admit: 2015-03-06 | Discharge: 2015-03-07 | Discharge status: Against Medical Advice | Payer: Self-pay | Attending: Emergency Medicine | Admitting: Emergency Medicine

## 2015-03-06 DIAGNOSIS — Z79899 Other long term (current) drug therapy: Secondary | ICD-10-CM | POA: Insufficient documentation

## 2015-03-06 DIAGNOSIS — M7989 Other specified soft tissue disorders: Secondary | ICD-10-CM

## 2015-03-06 DIAGNOSIS — Z720 Tobacco use: Secondary | ICD-10-CM | POA: Insufficient documentation

## 2015-03-06 DIAGNOSIS — F419 Anxiety disorder, unspecified: Secondary | ICD-10-CM | POA: Insufficient documentation

## 2015-03-06 DIAGNOSIS — Z8719 Personal history of other diseases of the digestive system: Secondary | ICD-10-CM | POA: Insufficient documentation

## 2015-03-06 DIAGNOSIS — M549 Dorsalgia, unspecified: Secondary | ICD-10-CM

## 2015-03-06 DIAGNOSIS — G8929 Other chronic pain: Secondary | ICD-10-CM | POA: Insufficient documentation

## 2015-03-06 DIAGNOSIS — M545 Low back pain: Secondary | ICD-10-CM | POA: Insufficient documentation

## 2015-03-06 DIAGNOSIS — Z87440 Personal history of urinary (tract) infections: Secondary | ICD-10-CM | POA: Insufficient documentation

## 2015-03-06 DIAGNOSIS — R2240 Localized swelling, mass and lump, unspecified lower limb: Secondary | ICD-10-CM | POA: Insufficient documentation

## 2015-03-06 DIAGNOSIS — Z880 Allergy status to penicillin: Secondary | ICD-10-CM | POA: Insufficient documentation

## 2015-03-06 DIAGNOSIS — J449 Chronic obstructive pulmonary disease, unspecified: Secondary | ICD-10-CM | POA: Insufficient documentation

## 2015-03-06 DIAGNOSIS — F319 Bipolar disorder, unspecified: Secondary | ICD-10-CM | POA: Insufficient documentation

## 2015-03-06 HISTORY — DX: Malingerer (conscious simulation): Z76.5

## 2015-03-06 HISTORY — DX: Edema, unspecified: R60.9

## 2015-03-06 HISTORY — DX: Other chronic pain: G89.29

## 2015-03-06 HISTORY — DX: Disorder of teeth and supporting structures, unspecified: K08.9

## 2015-03-06 NOTE — ED Notes (Signed)
Patient states that she is swelling from head to toe, states that her back hurts, and that she is having panic attacks and her MD will not put her back on her Xanax.

## 2015-03-06 NOTE — ED Notes (Signed)
Patient became inpatient and didn't want to stay in her room, wanted to wait in emergency room waiting room to wait for discharge papers and scheduling of ultrasound for legs, per EDP [place] this was fine.

## 2015-03-06 NOTE — Discharge Instructions (Signed)
°Emergency Department Resource Guide °1) Find a Doctor and Pay Out of Pocket °Although you won't have to find out who is covered by your insurance plan, it is a good idea to ask around and get recommendations. You will then need to call the office and see if the doctor you have chosen will accept you as a new patient and what types of options they offer for patients who are self-pay. Some doctors offer discounts or will set up payment plans for their patients who do not have insurance, but you will need to ask so you aren't surprised when you get to your appointment. ° °2) Contact Your Local Health Department °Not all health departments have doctors that can see patients for sick visits, but many do, so it is worth a call to see if yours does. If you don't know where your local health department is, you can check in your phone book. The CDC also has a tool to help you locate your state's health department, and many state websites also have listings of all of their local health departments. ° °3) Find a Walk-in Clinic °If your illness is not likely to be very severe or complicated, you may want to try a walk in clinic. These are popping up all over the country in pharmacies, drugstores, and shopping centers. They're usually staffed by nurse practitioners or physician assistants that have been trained to treat common illnesses and complaints. They're usually fairly quick and inexpensive. However, if you have serious medical issues or chronic medical problems, these are probably not your best option. ° °No Primary Care Doctor: °- Call [facility] at  832-8000 - they can help you locate a primary care doctor that  accepts your insurance, provides certain services, etc. °- Physician Referral Service- 1-800-533-3463 ° °Chronic Pain Problems: °Organization         Address  Phone   Notes  °[facility]  (336) 297-2271 Patients need to be referred by their primary care doctor.  ° °Medication  Assistance: °Organization         Address  Phone   Notes  °[place] [facility] [address] °[place], [place] [address] (336) 641-8030 --Must be a resident of [place] °-- Must have NO insurance coverage whatsoever (no Medicaid/ Medicare, etc.) °-- The pt. MUST have a primary care doctor that directs their care regularly and follows them in the community °  °[facility]  (866) 331-1348   °[employer]  (888) 892-1162   ° °Agencies that provide inexpensive medical care: °Organization         Address  Phone   Notes  °[facility]  (336) 832-8035   ° Internal Medicine    (336) 832-7272   °[facility] [address] °[place], [place] [address] (336) 832-4777   °[facility] [address], °[place] (336) 271-4999   °[facility]    (336) 373-0678   °[facility]    (336) 272-1050   °[facility] ° [address], [place] Phone:  (336) 832-4444, Fax:  (336) 832-4440 Hours of Operation:  9 am - 6 pm, M-F.  Also accepts Medicaid/Medicare and self-pay.  °[facility] ° [address], [place] Phone: (336) 832-3150, Fax: (336) 832-3151. Hours of Operation:  8:30 am - 5:30 pm, M-F.  Also accepts Medicaid and self-pay.  °[facility] [place] [address], [place] Phone: (336) 878-6027   °[facility] [address], [place], [place] (336)723-1848, Ext. 123 Mondays & Thursdays: 7-9 AM.  First 15 patients are seen on a first come, first serve basis. °  ° °Medicaid-accepting [place] Providers: ° °Organization         Address  Phone   Notes  °[facility] [address], [place] (336) 641-2100 Also accepts self-pay patients.  °[facility] [address], [place] ° (336) 856-9996   °[facility] [address], [place]  (336) 288-8857   °[facility] [address] [place] [address], [place] (336) 299-7000   °Veita Bland [address], [place]  ° (336) 373-1557 Only accepts [place] Access Medicaid patients after they have their name applied to their card.  ° °Self-Pay (no insurance) in [place]: ° °Organization         Address  Phone   Notes  °Sickle Cell Patients, [facility] [address], [place] (336) 832-1970   °[facility] [address], [place] (336) 832-4400   °[facility] ° [address], [place] (336) 992-4800   °[facility]/Dr. Osei-Bonsu ° [address] [place] [address], [place] or [address], [place] (336) 841-8500 Phone number for both [place] and [place] locations is the same.  °[facility] [address], [place] (336) 299-0000   °[facility] [address] [place] [address], [place] or [address] (336) 852-7530 °(336) 878-2260   °[facility] [address], [place] (336) 350-1642, phone; (336) 294-5005, fax Sees patients 1st and 3rd Saturday of every month.  Must not qualify for public or private insurance (i.e. Medicaid, Medicare, [place] Health Choice, Veterans' Benefits) • Household income should be no more than 200% of the poverty level •The clinic cannot treat you if you are pregnant or think you are pregnant • Sexually transmitted diseases are not treated at the clinic.  ° ° °Dental Care: °Organization         Address  Phone  Notes  °[place] [facility] [address], [place] (336) 641-6152 Accepts children up to age 21 who are enrolled in Medicaid or [place] Health Choice; pregnant women with a Medicaid card; and children who have applied for Medicaid or [place] Health Choice, but were declined, whose parents can pay a reduced fee at time of service.  °[place]  [facility] [place]  [address], [place] (336) 641-7733 Accepts children up to age 21 who are enrolled in Medicaid or [place] Health Choice; pregnant women with a Medicaid card; and children who have applied for Medicaid or [place] Health Choice, but were declined, whose parents can pay a reduced fee at time of service.  °[facility] ° [address],  (336) 641-4533 Patients are seen by appointment only. Walk-ins are not accepted. [facility] will see patients 18 years of age and older. °Monday - Tuesday (8am-5pm) °Most Wednesdays (8:30-5pm) °$30 per visit, cash only  °[facility] ° [address], [place] (336) 641-4533 Patients are seen by appointment only. Walk-ins are not accepted. [facility] will see patients 18 years of age and older. °One   Wednesday Evening (Monthly: Volunteer Based).  $30 per visit, cash only  °[facility]  (919) 537-3737 for adults; Children under age 4, call Graduate Pediatric Dentistry at (919) [phone]. Children aged 4-14, please call (919) [phone] to request a pediatric application. ° Dental services are provided in all areas of dental care including fillings, crowns and bridges, complete and partial dentures, implants, gum treatment, root canals, and extractions. Preventive care is also provided. Treatment is provided to both adults and children. °Patients are selected via a lottery and there is often a waiting list. °  °[facility] [address], °[place] ° (336) 763-8833 [email or web address] °  °[facility] [address], [place], [place] (336)723-1848, Ext. 123 Second and Fourth Thursday of each month, opens at 6:30 AM; Clinic ends at 9 AM.  Patients are seen on a first-come first-served basis, and a limited number are seen during each clinic.  ° °[facility] ° [address], [place], [place] (336) 723-7904    Eligibility Requirements °You must have lived in [place], [place], or [place] counties for at least the last three months. °  You cannot be eligible for state or federal sponsored healthcare insurance, including Veterans Administration, Medicaid, or Medicare. °  You generally cannot be eligible for healthcare insurance through your employer.  °  How to apply: °Eligibility screenings are held every Tuesday and Wednesday afternoon from 1:00 pm until 4:00 pm. You do not need an appointment for the interview!  °[facility] [address], [place], [place] 336-631-2330   °[place] [facility]  336-342-8273   °[place]  336-703-3100   °[facility]  336-570-6415   ° °Behavioral Health Resources in the Community: °Intensive Outpatient Programs °Organization         Address  Phone  Notes  °[facility] [address], [place], [place] 336-878-6098   °[facility] [address], [place], [place] 336-832-9800   °[facility] [address], [place], [place] ° 336-882-2125   °[facility] [address],  °[place], [place] 1-800-853-5163 or 336-641-4981   °Substance Abuse Resources °Organization         Address  Phone  Notes  °[facility]  336-882-2125   °[facility]  336-784-9470   °[facility]  336-285-9073   °[facility]  336-845-3988   °Residential & Outpatient Substance Abuse Program  1-800-659-3381   °Psychological Services °Organization         Address  Phone  Notes  °[facility]  336- 832-9600   °[facility]  336- 378-7881   °[facility] [address], [place] 1-800-853-5163 or 336-641-4981   ° °Mobile Crisis Teams °Organization         Address  Phone  Notes  °[facility], Mobile Crisis Care Unit  1-877-626-1772   °Assertive °[facility] ° [address]  [place], [place] 336-834-9664   °[name] DeEsch [address] °[place] 336-554-5454   ° °Self-Help/Support Groups °Organization         Address  Phone             Notes  °[facility]  - variety of support groups  336- 373-1402 Call for more information  °Narcotics Anonymous (NA), [facility] [address], °[place]  2 meetings at this location  ° °  Residential Treatment Programs °Organization         Address  Phone  Notes  °[facility] [address],    °[place]  1-866-801-8205   °[facility] ° [address], [place], [place] 704-293-8524   °[facility] [address], [place] 336-845-3988 Admissions: 8am-3pm M-F  °[facility] [address]    °[place], [place] 336-841-1104   °[facility] [address], [place], [place] 336-379-7146   °[facility] [address]  °[place], [place] 336-285-9073   °[facility] - Intensive Outpatient [address], [place], [place] 336-852-3033   °[facility] [address]  °[place], [place] 1-877-615-2722 or 336-784-9470   °[facility] [address] [place], [place] 336-227-7417 Accepts Medicaid  °[facility] [address]  °[place] 1-800-659-3381 Substance Abuse/Addiction Treatment  ° °[place] Behavioral Health Resources °Organization         Address  Phone  Notes  °[facility]  (888) 581-9988   °Julie Brannon, PhD [address] [place], [place]   (336) 349-5553 or (336) 951-0000   °[facility]   [address] °[place], [place] (336) 349-4454   °[facility] [address], [place], [place] (336) 342-8316 Insurance/Medicaid/sponsorship through [facility]  °[facility] [address]                                    [place], [place] (336) 342-8316 Therapy/tele-psych/case    °[facility] [address]  ° [place], [place] (336) 349-2233    °Dr. Arfeen  (336) 349-4544   °[facility] [place]  [employer] [place] [facility] 1) [address], [place] °2) [address], [place] °3)  [address], [place] (336) 349-3220 °(336) 342-7768 ° °(336) 342-8140   °[place] Child Abuse [facility] (336) 342-1394 or (336) 342-3537 (After Hours)    ° ° °

## 2015-03-06 NOTE — ED Provider Notes (Signed)
CSN: [id number]     Arrival date & time 03/06/15  2139 History  This chart was scribed for Julianne Rice, MD by Randa Evens, ED Scribe. This patient was seen in room APA07/APA07 and the patient's care was started at 11:07 PM.      Chief Complaint  Patient presents with  . Leg Swelling   The history is provided by the patient. No language interpreter was used.   HPI Comments: Lynn Wallace is a 44 y.o. female who presents to the Emergency Department complaining of intermittent swelling from head to toe onset 2-3 months. Pt states that she notices her legs are more swollen and that she tries to keep them elevated with no relief. Pt states that she has seen her PCP for these symptoms but states she is not happy with the care provided. Pt reports chronic back pain and panic attacks as well. Pt states that her panic attack improve with xanax but states that her PCP will not prescribe her xanax any more. Pt states she has chronic back pain that usually improves with percocet. Pt states that she has been taking the percocet so long that she thinks her body has built up a tolerance and no longer provides relief. No urinary or bowel incontinence. No weakness or numbness. Pt states she is compliant with taking her Lasix, she states that she takes 4 tablets per day. Pt doesn't report any other symptoms.   Past Medical History  Diagnosis Date  . Psychiatric problem     multiple addmissons for detox  . 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 Chronic low back pain 2012  . GERD (gastroesophageal reflux disease)   . Irritable bowel syndrome (IBS) 2012  . UTI (lower urinary tract infection)   . Anxiety 2013  . Polysubstance abuse 1990-2008    including narcotics, cocaine, benzodiazepines,  and marijuana  . Bronchitis   . COPD (chronic obstructive pulmonary disease)   . Drug-seeking behavior   . Chronic dental pain   . Swelling     "all over"   Past  Surgical History  Procedure Laterality Date  . Dilation and curettage of uterus  2000  . Tubal ligation  2001   Family History  Problem Relation Age of Onset  . Coronary artery disease      Female 1st degree relative <60  . Lung cancer Maternal Grandfather   . Stroke      M 1st degree relative <50  . Arthritis Mother   . Cancer Mother     cervical cancer   . Cancer Brother     throat cancer   . Heart disease Maternal Grandmother   . Heart disease Maternal Grandfather   . Diabetes Maternal Grandfather   . Throat cancer Brother    History  Substance Use Topics  . Smoking status: Current Every Day Smoker -- 2.00 packs/day for 31 years    Types: Cigarettes  . Smokeless tobacco: Never Used     Comment: Trying to cut back, smoking 1 qod  . Alcohol Use: No   OB History    Gravida Para Term Preterm AB TAB SAB Ectopic Multiple Living   7 5 5  0 2 1 1   5       Review of Systems  Constitutional: Negative for fever and chills.  Respiratory: Negative for cough and shortness of breath.   Cardiovascular: Positive for leg swelling. Negative for chest pain.  Gastrointestinal: Negative for  nausea, vomiting, abdominal pain and diarrhea.  Genitourinary: Negative for difficulty urinating.  Musculoskeletal: Positive for myalgias and back pain. Negative for neck pain and neck stiffness.  Skin: Negative for rash and wound.  Neurological: Negative for dizziness, weakness, light-headedness, numbness and headaches.  Psychiatric/Behavioral: Negative for suicidal ideas. The patient is nervous/anxious.   All other systems reviewed and are negative.    Allergies  Doxycycline; Hydrocodone-acetaminophen; Ibuprofen; Penicillins; Tramadol; Cleocin; and Keflex  Home Medications   Prior to Admission medications   Medication Sig Start Date End Date Taking? Authorizing Provider  acetaminophen (TYLENOL) 650 MG CR tablet Take 1,950 mg by mouth every 8 (eight) hours as needed for pain (back pain).     Yes Historical Provider, MD  albuterol (PROVENTIL HFA;VENTOLIN HFA) 108 (90 BASE) MCG/ACT inhaler Inhale 2 puffs into the lungs every 6 (six) hours as needed for wheezing or shortness of breath. 01/05/15  Yes Juanito Doom, MD  furosemide (LASIX) 20 MG tablet Take 20 mg by mouth 2 (two) times daily.   Yes Historical Provider, MD  oxyCODONE-acetaminophen (PERCOCET/ROXICET) 5-325 MG per tablet Take 2 tablets by mouth every 4 (four) hours as needed for severe pain (back pain).   Yes Historical Provider, MD  ALPRAZolam Duanne Moron) 1 MG tablet Take 1 mg by mouth daily as needed for anxiety.    Historical Provider, MD  mometasone-formoterol (DULERA) 200-5 MCG/ACT AERO Inhale 2 puffs into the lungs 2 (two) times daily. Patient not taking: Reported on 01/27/2015 01/05/15   Juanito Doom, MD  Spacer/Aero-Holding Chambers (AEROCHAMBER MV) inhaler Use as instructed Patient not taking: Reported on 01/27/2015 09/08/14   Juanito Doom, MD   BP 119/76 mmHg  Pulse 95  Temp(Src) 97.9 F (36.6 C) (Oral)  Resp 18  Ht 5\' 2"  (1.575 m)  Wt 220 lb (99.791 kg)  BMI 40.23 kg/m2  SpO2 95%   Physical Exam  Constitutional: She is oriented to person, place, and time. She appears well-developed and well-nourished. No distress.  HENT:  Head: Normocephalic and atraumatic.  Mouth/Throat: Oropharynx is clear and moist. No oropharyngeal exudate.  Eyes: EOM are normal. Pupils are equal, round, and reactive to light.  Neck: Normal range of motion. Neck supple. No JVD present.  Cardiovascular: Normal rate and regular rhythm.   Pulmonary/Chest: Effort normal and breath sounds normal. No respiratory distress. She has no wheezes. She has no rales. She exhibits no tenderness.  Abdominal: Soft. Bowel sounds are normal. She exhibits no distension and no mass. There is no tenderness. There is no rebound and no guarding.  Musculoskeletal: Normal range of motion. She exhibits edema. She exhibits no tenderness.  Diffuse lower  extremity edema that is nonpitting. Mild calf tenderness bilaterally. Distal pulses intact. Negative straight leg raise. Tenderness to palpation in the left paraspinal lumbar musculature. No midline thoracic or lumbar tenderness.  Neurological: She is alert and oriented to person, place, and time.  5/5 motor in all extremities. Sensation is fully intact. Patient is able to move without difficulty.  Skin: Skin is warm and dry. No rash noted. No erythema.  Psychiatric: She has a normal mood and affect. Her behavior is normal.  Nursing note and vitals reviewed.   ED Course  Procedures (including critical care time) DIAGNOSTIC STUDIES: Oxygen Saturation is 95% on RA, adequate by my interpretation.    COORDINATION OF CARE: 11:32 PM-Discussed treatment plan with pt at bedside and pt agreed to plan.     Labs Review Labs Reviewed - No data to  display  Imaging Review No results found.   EKG Interpretation None      MDM   Final diagnoses:  Leg swelling  Anxiety  Chronic back pain   I personally performed the services described in this documentation, which was scribed in my presence. The recorded information has been reviewed and is accurate.  Patient was seen in emergency department last month for similar complaints. Had a thorough workup for swelling. All her laboratory results were normal. Patient has not had ultrasounds of her legs to rule out DVT. Patient states that her primary physician has drawn a d-dimer which was normal. I've offered to arrange this for the patient. Also giving her referral for mental health and information on finding a new primary physician since she appears to be happy with her current. Return precautions have been given.     Julianne Rice, MD 03/07/15 [phone]

## 2015-03-07 ENCOUNTER — Other Ambulatory Visit (HOSPITAL_COMMUNITY): Payer: Self-pay | Admitting: Emergency Medicine

## 2015-03-07 ENCOUNTER — Ambulatory Visit (HOSPITAL_COMMUNITY): Admit: 2015-03-07 | Payer: Self-pay

## 2015-03-07 DIAGNOSIS — M7989 Other specified soft tissue disorders: Secondary | ICD-10-CM

## 2015-03-07 NOTE — ED Notes (Signed)
After patient stated she would wait for her discharge papers and scheduling of ultrasound of legs, patient left AMA.

## 2015-03-08 ENCOUNTER — Encounter (HOSPITAL_BASED_OUTPATIENT_CLINIC_OR_DEPARTMENT_OTHER): Payer: Self-pay

## 2015-03-08 ENCOUNTER — Ambulatory Visit (HOSPITAL_COMMUNITY)
Admission: RE | Admit: 2015-03-08 | Discharge: 2015-03-08 | Disposition: A | Discharge status: Home / Self Care | Payer: Self-pay | Source: Ambulatory Visit | Attending: Emergency Medicine | Admitting: Emergency Medicine

## 2015-03-08 DIAGNOSIS — M7989 Other specified soft tissue disorders: Secondary | ICD-10-CM

## 2015-03-08 DIAGNOSIS — M79604 Pain in right leg: Secondary | ICD-10-CM | POA: Insufficient documentation

## 2015-03-08 DIAGNOSIS — M79605 Pain in left leg: Secondary | ICD-10-CM | POA: Insufficient documentation

## 2015-03-08 DIAGNOSIS — R609 Edema, unspecified: Secondary | ICD-10-CM | POA: Insufficient documentation

## 2015-04-12 ENCOUNTER — Ambulatory Visit (HOSPITAL_COMMUNITY): Payer: Self-pay

## 2015-04-18 ENCOUNTER — Telehealth (HOSPITAL_COMMUNITY): Payer: Self-pay

## 2015-04-18 NOTE — Telephone Encounter (Signed)
Patient given detailed instructions per Myocardial Perfusion Study Information Sheet for test on 04-18-2015 at 12:00noon. Patient verbalized understanding. Lynn Wallace, Maryam Feely A

## 2015-04-19 ENCOUNTER — Ambulatory Visit (HOSPITAL_COMMUNITY): Payer: Self-pay | Attending: Internal Medicine

## 2015-04-19 VITALS — Ht 62.0 in | Wt 222.0 lb

## 2015-04-19 DIAGNOSIS — R079 Chest pain, unspecified: Secondary | ICD-10-CM

## 2015-04-19 LAB — MYOCARDIAL PERFUSION IMAGING
CHL CUP NUCLEAR SRS: 0
CHL CUP NUCLEAR SSS: 1
CHL CUP RESTING HR STRESS: 65 {beats}/min
CHL CUP STRESS STAGE 1 DBP: 32 mmHg
CHL CUP STRESS STAGE 4 SPEED: 0 mph
CHL CUP STRESS STAGE 5 GRADE: 0 %
CHL CUP STRESS STAGE 5 SBP: 95 mmHg
CHL CUP STRESS STAGE 5 SPEED: 0 mph
CHL CUP STRESS STAGE 6 DBP: 69 mmHg
CHL CUP STRESS STAGE 6 SBP: 93 mmHg
Estimated workload: 1 METS
LV dias vol: 97 mL
LV sys vol: 38 mL
NUC STRESS EF: 61 %
Peak BP: 108 mmHg
Peak HR: 90 {beats}/min
Percent of predicted max HR: 51 %
RATE: 0.27
SDS: 1
Stage 1 Grade: 0 %
Stage 1 HR: 66 {beats}/min
Stage 1 SBP: 94 mmHg
Stage 1 Speed: 0 mph
Stage 2 Grade: 0 %
Stage 2 HR: 66 {beats}/min
Stage 2 Speed: 0 mph
Stage 3 Grade: 0 %
Stage 3 HR: 103 {beats}/min
Stage 3 Speed: 0 mph
Stage 4 DBP: 68 mmHg
Stage 4 Grade: 0 %
Stage 4 HR: 90 {beats}/min
Stage 4 SBP: 108 mmHg
Stage 5 DBP: 66 mmHg
Stage 5 HR: 90 {beats}/min
Stage 6 Grade: 0 %
Stage 6 HR: 83 {beats}/min
Stage 6 Speed: 0 mph
TID: 1

## 2015-04-19 MED ORDER — TECHNETIUM TC 99M SESTAMIBI GENERIC - CARDIOLITE
33.0000 | Freq: Once | INTRAVENOUS | Status: AC | PRN
  Administered 2015-04-19: 33 via INTRAVENOUS

## 2015-04-19 MED ORDER — REGADENOSON 0.4 MG/5ML IV SOLN
0.4000 mg | Freq: Once | INTRAVENOUS | Status: AC
  Administered 2015-04-19: 0.4 mg via INTRAVENOUS

## 2015-04-19 MED ORDER — TECHNETIUM TC 99M SESTAMIBI GENERIC - CARDIOLITE
11.0000 | Freq: Once | INTRAVENOUS | Status: AC | PRN
  Administered 2015-04-19: 11 via INTRAVENOUS

## 2015-06-28 ENCOUNTER — Ambulatory Visit: Payer: Self-pay | Admitting: Adult Health

## 2015-07-12 ENCOUNTER — Ambulatory Visit: Payer: Self-pay | Admitting: Adult Health

## 2015-07-13 ENCOUNTER — Ambulatory Visit: Payer: Self-pay | Admitting: Pulmonary Disease

## 2015-07-26 ENCOUNTER — Ambulatory Visit: Payer: Self-pay | Admitting: Adult Health

## 2015-08-16 ENCOUNTER — Telehealth: Payer: Self-pay | Admitting: Pulmonary Disease

## 2015-08-16 ENCOUNTER — Other Ambulatory Visit: Payer: Self-pay | Admitting: *Deleted

## 2015-08-16 ENCOUNTER — Ambulatory Visit (INDEPENDENT_AMBULATORY_CARE_PROVIDER_SITE_OTHER): Payer: Self-pay | Admitting: Adult Health

## 2015-08-16 ENCOUNTER — Encounter: Payer: Self-pay | Admitting: Adult Health

## 2015-08-16 VITALS — BP 112/84 | HR 75 | Ht 62.0 in | Wt 219.0 lb

## 2015-08-16 DIAGNOSIS — J411 Mucopurulent chronic bronchitis: Secondary | ICD-10-CM

## 2015-08-16 MED ORDER — MOMETASONE FURO-FORMOTEROL FUM 200-5 MCG/ACT IN AERO: 2.0000 | INHALATION_SPRAY | Freq: Two times a day (BID) | RESPIRATORY_TRACT | Status: DC

## 2015-08-16 MED ORDER — MOMETASONE FURO-FORMOTEROL FUM 200-5 MCG/ACT IN AERO: 2.0000 | INHALATION_SPRAY | Freq: Two times a day (BID) | RESPIRATORY_TRACT | Status: AC

## 2015-08-16 NOTE — Progress Notes (Signed)
   Subjective:    Patient ID: Lynn Wallace, female    DOB: 05/24/1971, 44 y.o.   MRN: [id number]  HPI Synopsis: Mrs. Fluegge is an obese smoker who presented to the [facility] pulmonary clinic in October 2015 for evaluation of Gold grade B COPD. Simple spirometry at that time showed an FEV1 to FVC ratio of 70% with clear airflow obstruction and the flow volume loop, but the FVC was about 63% predicted likely due to obesity airflow obstruction cannot be quantified.   08/16/2015 Follow up : COPD  Pt returns for follow up .  Continues to have dyspnea with activity .  Is filing for disability as she says she is not able to work due to dyspnea.  Gets winded with activity . Has cough most days with minimal production .  She is suppose to be on dulera but can not afford.  She has no income and no insurance We discussed patient assistance program for [employer] . Paperwork was completed with rx for pt.  She says that albuterol and Dulera make her nervous. Requests something for anxiety and explained that this will need to discussed with her PCP.  Congratulated her on smoking cessation  Requests mover to [facility] practice with Dr. Elsworth Soho  As she lives closer .  Does request letter to social services explaining she can not work so she can get her food stamps.    Review of Systems  Constitutional:   No  weight loss, night sweats,  Fevers, chills, +fatigue, or  lassitude.  HEENT:   No headaches,  Difficulty swallowing,  Tooth/dental problems, or  Sore throat,                No sneezing, itching, ear ache, nasal congestion, post nasal drip,   CV:  No chest pain,  Orthopnea, PND, swelling in lower extremities, anasarca, dizziness, palpitations, syncope.   GI  No heartburn, indigestion, abdominal pain, nausea, vomiting, diarrhea, change in bowel habits, loss of appetite, bloody stools.   Resp:    No chest wall deformity  Skin: no rash or lesions.  GU: no dysuria, change in color of urine, no urgency or  frequency.  No flank pain, no hematuria   MS:  No joint pain or swelling.  No decreased range of motion.  No back pain.  Psych:  No change in mood or affect. No depression or anxiety.  No memory loss.          Objective:   Physical Exam  GEN: A/Ox3; pleasant , NAD, obese   HEENT:  [place]/AT,  EACs-clear, TMs-wnl, NOSE-clear, THROAT-clear, no lesions, no postnasal drip or exudate noted.   NECK:  Supple w/ fair ROM; no JVD; normal carotid impulses w/o bruits; no thyromegaly or nodules palpated; no lymphadenopathy.  RESP  Decreased BS in bases , no accessory muscle use, no dullness to percussion  CARD:  RRR, no m/r/g  , no peripheral edema, pulses intact, no cyanosis or clubbing.  GI:   Soft & nt; nml bowel sounds; no organomegaly or masses detected.  Musco: Warm bil, no deformities or joint swelling noted.   Neuro: alert, no focal deficits noted.    Skin: Warm, no lesions or rashes        Assessment & Plan:

## 2015-08-16 NOTE — Telephone Encounter (Signed)
Patient came to [facility] office to see TP this morning. She is requesting to change providers.  She says she wants to start seeing Dr. Elsworth Soho in [place].  Dr. Elsworth Soho - please advise if ok for change of provider Dr. Lake Bells - please advise if ok for change of provider

## 2015-08-16 NOTE — Assessment & Plan Note (Signed)
Difficult to control COPD as finance are major barrier with no income and Research officer, [employer] for [employer] assistance program completed.  Congratulated on smoking cessation  Declines flu shot .   Plan  Restart Dulera 2 puffs Twice daily   Patient assistance paperwork given for [facility] job with not smoking .  Follow up in 3 months and As needed  -we will call you with that appointment

## 2015-08-16 NOTE — Patient Instructions (Addendum)
Restart Dulera 2 puffs Twice daily   Patient assistance paperwork given for [facility] job with not smoking .  Follow up in 3 months and As needed  -we will call you with that appointment

## 2015-08-20 NOTE — Telephone Encounter (Signed)
RA please advise if you're ok with seeing this pt in [place].  Thanks!

## 2015-08-20 NOTE — Telephone Encounter (Signed)
OK by me 

## 2015-08-20 NOTE — Progress Notes (Signed)
I agree with this plan of care 

## 2015-08-20 NOTE — Telephone Encounter (Signed)
Can see TP

## 2015-08-22 NOTE — Telephone Encounter (Signed)
Left message for patient to call back. Needs to schedule follow up with TP at [place] office in 3 months.

## 2015-08-22 NOTE — Telephone Encounter (Signed)
Pt called back and I gave her appt with TP in [place] in December.  Nothing else needed.

## 2015-08-29 ENCOUNTER — Ambulatory Visit: Payer: Self-pay | Admitting: Family Medicine

## 2015-08-29 NOTE — Telephone Encounter (Signed)
I will not be able to see her - she can see TP @ [place] & stick to dr Lake Bells intermittently

## 2015-11-01 ENCOUNTER — Ambulatory Visit: Payer: Self-pay | Admitting: Adult Health

## 2015-11-29 ENCOUNTER — Ambulatory Visit: Payer: Self-pay | Admitting: Adult Health

## 2015-12-06 ENCOUNTER — Ambulatory Visit: Payer: Self-pay | Admitting: Adult Health

## 2015-12-13 ENCOUNTER — Ambulatory Visit: Payer: Self-pay | Admitting: Adult Health

## 2015-12-27 ENCOUNTER — Ambulatory Visit: Payer: Self-pay | Admitting: Adult Health

## 2016-01-03 ENCOUNTER — Ambulatory Visit (INDEPENDENT_AMBULATORY_CARE_PROVIDER_SITE_OTHER): Payer: Self-pay | Admitting: Adult Health

## 2016-01-03 ENCOUNTER — Encounter: Payer: Self-pay | Admitting: Adult Health

## 2016-01-03 VITALS — BP 106/74 | HR 78 | Temp 97.5°F | Ht 62.0 in | Wt 223.0 lb

## 2016-01-03 DIAGNOSIS — F17200 Nicotine dependence, unspecified, uncomplicated: Secondary | ICD-10-CM

## 2016-01-03 DIAGNOSIS — J411 Mucopurulent chronic bronchitis: Secondary | ICD-10-CM

## 2016-01-03 NOTE — Assessment & Plan Note (Signed)
Smoking cessation  

## 2016-01-03 NOTE — Assessment & Plan Note (Signed)
GOLD B COPD -in active smoker  Difficult situation with no insurance  Encouraged to fill out papers for patient assistance papers.  Will try to provide samples to help  Restart Dulera 2 puffs Twice daily  , rinse after use.  Patient assistance paperwork given for [facility]  Work on not smoking .  Follow up in 3 months with Dr. Elsworth Soho and As needed

## 2016-01-03 NOTE — Progress Notes (Signed)
   Subjective:    Patient ID: Lynn Wallace, female    DOB: 09-Jul-1971, 45 y.o.   MRN: [id number]  HPI Synopsis: Mrs. Flora is an obese smoker who presented to the [facility] pulmonary clinic in October 2015 for evaluation Gold grade B COPD. Simple spirometry at that time showed an FEV1 to FVC ratio of 70% with clear airflow obstruction and the flow volume loop, but the FVC was about 63% predicted likely due to obesity airflow obstruction cannot be quantified.   01/03/2016 Follow up : COPD  Pt returns for 4 month follow up .  Says she is doing about the same , has some cough and drainage on/off.  No fever , discolored mucus , chest pain, orthopnea, or edema  Has a lot of family stress.  She has run out of dulera  Has No insurance . Is appealing Medicaid.  Has lawyer trying to get disability.  We discussed patient assistance paperwork that we gave her last ov , she forgot about this .  We discussed sending these in.  Has started back smoking, discussed smoking cessation  Says her fiance is smoking a lot.  CXR in March 2016 with bronchitic changes.  Declines flu shot.  Weight continues to trend up, discussed healthy diet and wt loss.  Has 5 kids, ages 74-30 , has 2 at home still.      Review of Systems   Constitutional:   No  weight loss, night sweats,  Fevers, chills, +fatigue, or  lassitude.  HEENT:   No headaches,  Difficulty swallowing,  Tooth/dental problems, or  Sore throat,                No sneezing, itching, ear ache,  +nasal congestion, post nasal drip,   CV:  No chest pain,  Orthopnea, PND, swelling in lower extremities, anasarca, dizziness, palpitations, syncope.   GI  No heartburn, indigestion, abdominal pain, nausea, vomiting, diarrhea, change in bowel habits, loss of appetite, bloody stools.   Resp:    No chest wall deformity  Skin: no rash or lesions.  GU: no dysuria, change in color of urine, no urgency or frequency.  No flank pain, no hematuria   MS:   No joint pain or swelling.  No decreased range of motion.  No back pain.  Psych:  No change in mood or affect. No depression or anxiety.  No memory loss.          Objective:   Physical Exam  Filed Vitals:   01/03/16 1104  BP: 106/74  Pulse: 78  Temp: 97.5 F (36.4 C)  TempSrc: Oral  Height: 5\' 2"  (1.575 m)  Weight: 223 lb (101.152 kg)  SpO2: 95%    GEN: A/Ox3; pleasant , NAD, obese   HEENT:  [place]/AT,  EACs-clear, TMs-wnl, NOSE-clear, THROAT-clear, no lesions, no postnasal drip or exudate noted.   NECK:  Supple w/ fair ROM; no JVD; normal carotid impulses w/o bruits; no thyromegaly or nodules palpated; no lymphadenopathy.  RESP  Decreased BS in bases , no accessory muscle use, no dullness to percussion  CARD:  RRR, no m/r/g  , no peripheral edema, pulses intact, no cyanosis or clubbing.  GI:   Soft & nt; nml bowel sounds; no organomegaly or masses detected.  Musco: Warm bil, no deformities or joint swelling noted.   Neuro: alert, no focal deficits noted.    Skin: Warm, no lesions or rashes        Assessment & Plan:

## 2016-01-03 NOTE — Patient Instructions (Signed)
Restart Dulera 2 puffs Twice daily  , rinse after use.  Patient assistance paperwork given for [facility]  Work on not smoking .  Follow up in 3 months with Dr. Elsworth Soho and As needed

## 2016-01-07 NOTE — Progress Notes (Signed)
Reviewed & agree with plan  

## 2016-01-09 ENCOUNTER — Telehealth: Payer: Self-pay | Admitting: Adult Health

## 2016-01-09 NOTE — Telephone Encounter (Signed)
Patient returned Amanda's call.  Advised her of notes that samples were in [place] to pick up.

## 2016-01-09 NOTE — Telephone Encounter (Signed)
Called and spoke with pt. Explained to the patient that the samples will be ready for pick up tomorrow. She voiced understanding and had no further questions. Nothing further needed.

## 2016-01-09 NOTE — Telephone Encounter (Signed)
Pt was seen at the [facility] office on 01/03/16. At ov pt was told that [facility] samples would be brought to the [facility] office on 01/10/16 due to a shortage at the [facility] office at the time of her visit. I explained to her that I would call her on 01/09/16 to make her aware if samples would be ready for pick up on 01/10/16.   LVM for patient to return call to inform that samples will be ready for pick up at the [facility] office.

## 2016-03-04 ENCOUNTER — Encounter: Payer: Self-pay | Admitting: Gastroenterology

## 2016-03-10 ENCOUNTER — Encounter: Payer: Self-pay | Admitting: Gastroenterology

## 2016-04-10 ENCOUNTER — Encounter: Payer: Self-pay | Admitting: Pulmonary Disease

## 2016-04-11 ENCOUNTER — Telehealth: Payer: Self-pay

## 2016-04-11 NOTE — Telephone Encounter (Signed)
Dr Fuller Plan,      2012 report notes that prep is just FAIR with Moviprep.  Needs 2 day prep.  Is Suprep and Miralax OK with you?                                                                                                                                                                      Thank you, Angela/PV

## 2016-04-11 NOTE — Telephone Encounter (Signed)
Yes

## 2016-04-27 ENCOUNTER — Emergency Department (HOSPITAL_COMMUNITY): Payer: Self-pay

## 2016-04-27 ENCOUNTER — Encounter (HOSPITAL_COMMUNITY): Payer: Self-pay | Admitting: Emergency Medicine

## 2016-04-27 ENCOUNTER — Emergency Department (HOSPITAL_COMMUNITY)
Admission: EM | Admit: 2016-04-27 | Discharge: 2016-04-27 | Discharge status: Against Medical Advice | Payer: Self-pay | Attending: Emergency Medicine | Admitting: Emergency Medicine

## 2016-04-27 DIAGNOSIS — F17210 Nicotine dependence, cigarettes, uncomplicated: Secondary | ICD-10-CM | POA: Insufficient documentation

## 2016-04-27 DIAGNOSIS — F319 Bipolar disorder, unspecified: Secondary | ICD-10-CM | POA: Insufficient documentation

## 2016-04-27 DIAGNOSIS — J449 Chronic obstructive pulmonary disease, unspecified: Secondary | ICD-10-CM | POA: Insufficient documentation

## 2016-04-27 DIAGNOSIS — G8929 Other chronic pain: Secondary | ICD-10-CM | POA: Insufficient documentation

## 2016-04-27 DIAGNOSIS — R1030 Lower abdominal pain, unspecified: Secondary | ICD-10-CM | POA: Insufficient documentation

## 2016-04-27 DIAGNOSIS — M545 Low back pain: Secondary | ICD-10-CM | POA: Insufficient documentation

## 2016-04-27 DIAGNOSIS — M549 Dorsalgia, unspecified: Secondary | ICD-10-CM

## 2016-04-27 LAB — COMPREHENSIVE METABOLIC PANEL
ALBUMIN: 4.1 g/dL (ref 3.5–5.0)
ALT: 17 U/L (ref 14–54)
AST: 22 U/L (ref 15–41)
Alkaline Phosphatase: 77 U/L (ref 38–126)
Anion gap: 6 (ref 5–15)
BILIRUBIN TOTAL: 0.5 mg/dL (ref 0.3–1.2)
BUN: 16 mg/dL (ref 6–20)
CHLORIDE: 103 mmol/L (ref 101–111)
CO2: 28 mmol/L (ref 22–32)
Calcium: 9.3 mg/dL (ref 8.9–10.3)
Creatinine, Ser: 0.74 mg/dL (ref 0.44–1.00)
GFR calc Af Amer: >60 mL/min (ref >60)
GFR calc non Af Amer: >60 mL/min (ref >60)
GLUCOSE: 88 mg/dL (ref 65–99)
POTASSIUM: 4.7 mmol/L (ref 3.5–5.1)
Sodium: 137 mmol/L (ref 135–145)
Total Protein: 7.3 g/dL (ref 6.5–8.1)

## 2016-04-27 LAB — URINALYSIS, ROUTINE W REFLEX MICROSCOPIC
Bilirubin Urine: NEGATIVE
GLUCOSE, UA: NEGATIVE mg/dL
Hgb urine dipstick: NEGATIVE
Ketones, ur: NEGATIVE mg/dL
LEUKOCYTES UA: NEGATIVE
Nitrite: NEGATIVE
PH: 5.5 (ref 5.0–8.0)
Protein, ur: NEGATIVE mg/dL
SPECIFIC GRAVITY, URINE: 1.034 — AB (ref 1.005–1.030)

## 2016-04-27 LAB — CBC WITH DIFFERENTIAL/PLATELET
Basophils Absolute: 0 10*3/uL (ref 0.0–0.1)
Basophils Relative: 0 %
EOS PCT: 1 %
Eosinophils Absolute: 0.1 10*3/uL (ref 0.0–0.7)
HCT: 42.5 % (ref 36.0–46.0)
HEMOGLOBIN: 14.4 g/dL (ref 12.0–15.0)
LYMPHS ABS: 2.5 10*3/uL (ref 0.7–4.0)
LYMPHS PCT: 36 %
MCH: 32.3 pg (ref 26.0–34.0)
MCHC: 33.9 g/dL (ref 30.0–36.0)
MCV: 95.3 fL (ref 78.0–100.0)
MONOS PCT: 8 %
Monocytes Absolute: 0.5 10*3/uL (ref 0.1–1.0)
Neutro Abs: 3.8 10*3/uL (ref 1.7–7.7)
Neutrophils Relative %: 55 %
Platelets: 243 10*3/uL (ref 150–400)
RBC: 4.46 MIL/uL (ref 3.87–5.11)
RDW: 13.1 % (ref 11.5–15.5)
WBC: 6.9 10*3/uL (ref 4.0–10.5)

## 2016-04-27 LAB — LIPASE, BLOOD: Lipase: 23 U/L (ref 11–51)

## 2016-04-27 LAB — POC URINE PREG, ED: Preg Test, Ur: NEGATIVE

## 2016-04-27 MED ORDER — KETOROLAC TROMETHAMINE 60 MG/2ML IM SOLN
60.0000 mg | Freq: Once | INTRAMUSCULAR | Status: AC
  Administered 2016-04-27: 60 mg via INTRAMUSCULAR
  Filled 2016-04-27: qty 2

## 2016-04-27 MED ORDER — ONDANSETRON HCL 4 MG PO TABS
4.0000 mg | ORAL_TABLET | Freq: Once | ORAL | Status: AC
  Administered 2016-04-27: 4 mg via ORAL
  Filled 2016-04-27: qty 1

## 2016-04-27 NOTE — ED Provider Notes (Signed)
As I was walking to the patient, she refused to be interviewed by me. She is alert. appears no acute distress  Orlie Dakin, MD 04/27/16 1709

## 2016-04-27 NOTE — ED Provider Notes (Signed)
Medical Screening Exam.  Pt seen in [place].  Pt does have chronic back pain and is on Percocet 10s from PCP, ran out yesterday.  However, pt reports she is having low back pain but also lower abdominal pain and this is not typical of her chronic pain.  In my limited exam in the chair in [place], she does have tenderness in her lower back and also suprapubic abdominal area.    Room available in Main ED.  Pt to move to Main ED for further evaluation.      Clayton Bibles, PA-C 04/27/16 1442  Lacretia Leigh, MD 04/27/16 2248

## 2016-04-27 NOTE — ED Provider Notes (Signed)
CSN: [id number]     Arrival date & time 04/27/16  1352 History   First MD Initiated Contact with Patient 04/27/16 1430     Chief Complaint  Patient presents with  . Tailbone Pain    chronic pelvic and leg pain  . Abdominal Pain    low abdominal pain   HPI  Lynn Wallace is a 45 year old female with PMHx of chronic low back pain, IBS, polysubstance abuse, anxiety and bipolar disorder presenting with acute worsening of chronic back pain and suprapubic pain. She reports a history of lower back pain that radiates into the left lower leg. The pain is aching in the lumbar region with sharp and shooting pains into the lower extremities. She states that she is now feeling aching pain in her tailbone which is new for her back pain. She states that the pain "wraps around my hips". She states that the pain was so severe yesterday that she was having difficulty ambulating. She endorses associated suprapubic pain. She states that her pain was so severe that she became nauseous. No vomiting. She reportedly ran out of her percocet before onset of the pain. Denies recent injury to the back. Denies incontinence of bowel/bladder, numbness, tingling or weakness in the lower extremities. Denies dysuria, hematuria, malodorous urine, vaginal discharge or pelvic pain.   Chart review: pt has significant history of narcotic and benzo abuse with dismissal from PCP offices related to this issue. She has hx of aggressive behavior towards ED staff and leaving [facility].   Past Medical History  Diagnosis Date  . Psychiatric problem     multiple addmissons for detox  . 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 Chronic low back pain 2012  . GERD (gastroesophageal reflux disease)   . Irritable bowel syndrome (IBS) 2012  . UTI (lower urinary tract infection)   . Anxiety 2013  . Polysubstance abuse 1990-2008    including narcotics, cocaine, benzodiazepines,  and marijuana  . Bronchitis   . COPD  (chronic obstructive pulmonary disease) ([place])   . Drug-seeking behavior   . Chronic dental pain   . Swelling     "all over"   Past Surgical History  Procedure Laterality Date  . Dilation and curettage of uterus  2000  . Tubal ligation  2001   Family History  Problem Relation Age of Onset  . Coronary artery disease      Female 1st degree relative <60  . Lung cancer Maternal Grandfather   . Stroke      M 1st degree relative <50  . Arthritis Mother   . Cancer Mother     cervical cancer   . Cancer Brother     throat cancer   . Heart disease Maternal Grandmother   . Heart disease Maternal Grandfather   . Diabetes Maternal Grandfather   . Throat cancer Brother    Social History  Substance Use Topics  . Smoking status: Current Some Day Smoker -- 2.00 packs/day for 31 years    Types: Cigarettes    Last Attempt to Quit: 03/25/2015  . Smokeless tobacco: Never Used     Comment: 1-2 a week  . Alcohol Use: No   OB History    Gravida Para Term Preterm AB TAB SAB Ectopic Multiple Living   7 5 5  0 2 1 1   5      Review of Systems  All other systems reviewed and are negative.  Allergies  Doxycycline; Hydrocodone-acetaminophen; Ibuprofen; Penicillins; Tramadol; Cleocin; and Keflex  Home Medications   Prior to Admission medications   Medication Sig Start Date End Date Taking? Authorizing Provider  acetaminophen (TYLENOL) 650 MG CR tablet Take 1,950 mg by mouth 2 (two) times daily as needed for pain (back pain).    Yes Historical Provider, MD  albuterol (PROVENTIL HFA;VENTOLIN HFA) 108 (90 BASE) MCG/ACT inhaler Inhale 2 puffs into the lungs every 6 (six) hours as needed for wheezing or shortness of breath. 01/05/15  Yes Juanito Doom, MD  clonazePAM (KLONOPIN) 1 MG tablet Take 1 mg by mouth 3 (three) times daily as needed for anxiety.   Yes Historical Provider, MD  furosemide (LASIX) 20 MG tablet Take 20 mg by mouth daily as needed for fluid or edema. Reported on 01/03/2016    Yes Historical Provider, MD  mometasone-formoterol (DULERA) 200-5 MCG/ACT AERO Inhale 2 puffs into the lungs 2 (two) times daily. 08/16/15  Yes Tammy S Parrett, NP  oxyCODONE-acetaminophen (PERCOCET) 10-325 MG tablet Take 1 tablet by mouth 4 (four) times daily as needed for pain.  03/31/16  Yes Historical Provider, MD   BP 111/76 mmHg  Pulse 74  Temp(Src) 97.6 F (36.4 C) (Oral)  Resp 18  SpO2 98%  LMP  Physical Exam  Constitutional: She appears well-developed and well-nourished. No distress.  No acute distress  HENT:  Head: Normocephalic and atraumatic.  Eyes: Conjunctivae are normal. Right eye exhibits no discharge. Left eye exhibits no discharge. No scleral icterus.  Neck: Normal range of motion.  Cardiovascular: Normal rate, regular rhythm and normal heart sounds.   Pulmonary/Chest: Effort normal and breath sounds normal. No respiratory distress.  Abdominal: Soft. Normal appearance and bowel sounds are normal. There is tenderness in the suprapubic area. There is no rigidity, no rebound, no guarding and no CVA tenderness.  Mild suprapubic TTP. No peritoneal signs. No CVA tenderness  Musculoskeletal: Normal range of motion.       Lumbar back: She exhibits tenderness. She exhibits normal range of motion and no deformity.       Back:  Generalized TTP of the lumbar region. Pain includes the midline and paraspinal musculature. No focal points of pain. No bony deformities of the T or L spine. FROM of the spine intact. Negative straight leg raise bilaterally. FROM of bilateral lower extremities intact.   Neurological: She is alert. Coordination normal.  Poor effort on strength testing of lower extremities. Sensation intact.   Skin: Skin is warm and dry.  Psychiatric: She has a normal mood and affect. Her behavior is normal.  Nursing note and vitals reviewed.   ED Course  Procedures (including critical care time) Labs Review Labs Reviewed  URINALYSIS, ROUTINE W REFLEX MICROSCOPIC (NOT  AT [facility]) - Abnormal; Notable for the following:    Specific Gravity, Urine 1.034 (*)    All other components within normal limits  CBC WITH DIFFERENTIAL/PLATELET  COMPREHENSIVE METABOLIC PANEL  LIPASE, BLOOD  POC URINE PREG, ED    Imaging Review Dg Sacrum/coccyx  04/27/2016  CLINICAL DATA:  Tailbone/coccyx pain for 4 days.  No known injury. EXAM: SACRUM AND COCCYX - 2+ VIEW COMPARISON:  None. FINDINGS: There is no evidence of fracture or other focal bone lesions. IMPRESSION: Negative. Electronically Signed   By: Margarette Canada M.D.   On: 04/27/2016 16:33   I have personally reviewed and evaluated these images and lab results as part of my medical decision-making.   EKG Interpretation None  MDM   Final diagnoses:  Chronic back pain  Lower abdominal pain   45 year old female with PMHx of chronic back pain presenting with acute worsening of chronic pain and abdominal pain. VSS. Generalized TTP of the lumbar region without focal points of pain. FROM of spine intact. FROM of BLE intact. Non-focal neuro exam. Abdomen is soft with mild tenderness in suprapubic area. No peritoneal signs. Pt reports she recently ran out of her percocets. Blood work reassuring. UA without signs of infection. Discussed performing pelvic exam with patient for further evaluation of suprapubic pain which she declined stating that her back pain was too severe. Chart reviewed and pt has extensive history of polysubstance abuse. DEA database reviewed and pt had 30 day supply of percocet refilled on May 8. She is not due for refill yet. Will not provide narcotics today. Given toradol. Pt upset about no narcotics and asking for discharge. Attending attempted to discuss with pt before she left but she refused further interview and left AMA.     Lahoma Crocker Janet Humphreys, PA-C 04/27/16 1714  Lacretia Leigh, MD 04/27/16 2248

## 2016-04-27 NOTE — ED Notes (Signed)
Patient c/o Otila Kluver PA lied to her about xray results not being back... Patient demanding to leave.  Made Stevie PA aware.  Patient asking if she will be given any pain meds to hold her over until Thursday when she can see her PCP.  Made Stevie aware of that request--was told by [facility] PA patient wouldn't be getting any prescriptions for pain medications.  Made patient aware.  Patient responded, "I want to see her supervisor.  If something were to happen to me after I leave here, I will sue this hospital!  That's ok, I know someone I can buy them from until I see my doctor on Thursday!"   Made DR Winfred Leeds aware that patient is upset with Otila Kluver and would like to speak with her supervisor.  He stated that he would see her in little bit. Made patient aware that DR J was aware that she wanted to speak with him and that it would be a little bit. Patient refused to wait any longer. This RN wheeled patient to lobby in wheelchair, as going past nurse's desk passed Dr Lenna Sciara, who was on his way to speak with patient.  Informed patient that was him, if she wanted to wait and speak with him.  Patient replied, "No, I'll just go see my doctor tomorrow and call the head of this [facility]!!  He looked like an ass hole anyways."   Wheeled patient to entrance of ED where patient met her fiance--patient refused to wait for him to pull the car up to the door.  Patient got out of wheelchair by herself and started walking to the car.

## 2016-04-27 NOTE — Discharge Instructions (Signed)
Abdominal Pain, Adult °Many things can cause abdominal pain. Usually, abdominal pain is not caused by a disease and will improve without treatment. It can often be observed and treated at home. Your health care provider will do a physical exam and possibly order blood tests and X-rays to help determine the seriousness of your pain. However, in many cases, more time must pass before a clear cause of the pain can be found. Before that point, your health care provider may not know if you need more testing or further treatment. °HOME CARE INSTRUCTIONS °Monitor your abdominal pain for any changes. The following actions may help to alleviate any discomfort you are experiencing: °· Only take over-the-counter or prescription medicines as directed by your health care provider. °· Do not take laxatives unless directed to do so by your health care provider. °· Try a clear liquid diet (broth, tea, or water) as directed by your health care provider. Slowly move to a bland diet as tolerated. °SEEK MEDICAL CARE IF: °· You have unexplained abdominal pain. °· You have abdominal pain associated with nausea or diarrhea. °· You have pain when you urinate or have a bowel movement. °· You experience abdominal pain that wakes you in the night. °· You have abdominal pain that is worsened or improved by eating food. °· You have abdominal pain that is worsened with eating fatty foods. °· You have a fever. °SEEK IMMEDIATE MEDICAL CARE IF: °· Your pain does not go away within 2 hours. °· You keep throwing up (vomiting). °· Your pain is felt only in portions of the abdomen, such as the right side or the left lower portion of the abdomen. °· You pass bloody or black tarry stools. °MAKE SURE YOU: °· Understand these instructions. °· Will watch your condition. °· Will get help right away if you are not doing well or get worse. °  °This information is not intended to replace advice given to you by your health care provider. Make sure you discuss  any questions you have with your health care provider. °  °Document Released: 08/20/2005 Document Revised: 08/01/2015 Document Reviewed: 07/20/2013 °Elsevier Interactive Patient Education ©2016 [employer] ° °Chronic Back Pain ° When back pain lasts longer than 3 months, it is called chronic back pain. People with chronic back pain often go through certain periods that are more intense (flare-ups).  °CAUSES °Chronic back pain can be caused by wear and tear (degeneration) on different structures in your back. These structures include: °· The bones of your spine (vertebrae) and the joints surrounding your spinal cord and nerve roots (facets). °· The strong, fibrous tissues that connect your vertebrae (ligaments). °Degeneration of these structures may result in pressure on your nerves. This can lead to constant pain. °HOME CARE INSTRUCTIONS °· Avoid bending, heavy lifting, prolonged sitting, and activities which make the problem worse. °· Take brief periods of rest throughout the day to reduce your pain. Lying down or standing usually is better than sitting while you are resting. °· Take over-the-counter or prescription medicines only as directed by your caregiver. °SEEK IMMEDIATE MEDICAL CARE IF:  °· You have weakness or numbness in one of your legs or feet. °· You have trouble controlling your bladder or bowels. °· You have nausea, vomiting, abdominal pain, shortness of breath, or fainting. °  °This information is not intended to replace advice given to you by your health care provider. Make sure you discuss any questions you have with your health care provider. °  °Document   Released: 12/18/2004 Document Revised: 02/02/2012 Document Reviewed: 04/30/2015 °Elsevier Interactive Patient Education ©2016 [employer] ° °

## 2016-04-27 NOTE — ED Notes (Signed)
Pt reports chronic low bac-coccyx pain radiating down groin and legs. Pain increased over last 2 days. To be seen by PCP on 6/8. Pt stated that walking is painful

## 2016-04-29 ENCOUNTER — Encounter: Payer: Self-pay | Admitting: Gastroenterology

## 2016-07-24 ENCOUNTER — Ambulatory Visit: Payer: Self-pay | Admitting: Pulmonary Disease

## 2016-08-28 ENCOUNTER — Ambulatory Visit: Payer: Self-pay | Admitting: Family Medicine

## 2017-01-20 ENCOUNTER — Encounter: Payer: Self-pay | Admitting: Gastroenterology

## 2017-02-27 ENCOUNTER — Emergency Department (HOSPITAL_COMMUNITY)
Admission: EM | Admit: 2017-02-27 | Discharge: 2017-02-27 | Disposition: A | Discharge status: Home / Self Care | Payer: Self-pay | Attending: Emergency Medicine | Admitting: Emergency Medicine

## 2017-02-27 ENCOUNTER — Emergency Department (HOSPITAL_COMMUNITY): Payer: Self-pay

## 2017-02-27 DIAGNOSIS — F17210 Nicotine dependence, cigarettes, uncomplicated: Secondary | ICD-10-CM | POA: Insufficient documentation

## 2017-02-27 DIAGNOSIS — Z79899 Other long term (current) drug therapy: Secondary | ICD-10-CM | POA: Insufficient documentation

## 2017-02-27 DIAGNOSIS — R091 Pleurisy: Secondary | ICD-10-CM | POA: Insufficient documentation

## 2017-02-27 DIAGNOSIS — J449 Chronic obstructive pulmonary disease, unspecified: Secondary | ICD-10-CM | POA: Insufficient documentation

## 2017-02-27 LAB — COMPREHENSIVE METABOLIC PANEL
ALBUMIN: 3.5 g/dL (ref 3.5–5.0)
ALT: 15 U/L (ref 14–54)
ANION GAP: 8 (ref 5–15)
AST: 17 U/L (ref 15–41)
Alkaline Phosphatase: 75 U/L (ref 38–126)
BUN: 12 mg/dL (ref 6–20)
CO2: 26 mmol/L (ref 22–32)
Calcium: 9.2 mg/dL (ref 8.9–10.3)
Chloride: 103 mmol/L (ref 101–111)
Creatinine, Ser: 0.65 mg/dL (ref 0.44–1.00)
GFR calc Af Amer: >60 mL/min (ref >60)
GFR calc non Af Amer: >60 mL/min (ref >60)
GLUCOSE: 84 mg/dL (ref 65–99)
Potassium: 4 mmol/L (ref 3.5–5.1)
SODIUM: 137 mmol/L (ref 135–145)
Total Bilirubin: 0.1 mg/dL — ABNORMAL LOW (ref 0.3–1.2)
Total Protein: 6.6 g/dL (ref 6.5–8.1)

## 2017-02-27 LAB — PROTIME-INR
INR: 1
Prothrombin Time: 13.2 seconds (ref 11.4–15.2)

## 2017-02-27 LAB — CBC
HCT: 42 % (ref 36.0–46.0)
HEMOGLOBIN: 13.3 g/dL (ref 12.0–15.0)
MCH: 31.2 pg (ref 26.0–34.0)
MCHC: 31.7 g/dL (ref 30.0–36.0)
MCV: 98.6 fL (ref 78.0–100.0)
Platelets: 247 10*3/uL (ref 150–400)
RBC: 4.26 MIL/uL (ref 3.87–5.11)
RDW: 13.2 % (ref 11.5–15.5)
WBC: 5.4 10*3/uL (ref 4.0–10.5)

## 2017-02-27 LAB — I-STAT CHEM 8, ED
BUN: 15 mg/dL (ref 6–20)
CREATININE: 0.7 mg/dL (ref 0.44–1.00)
Calcium, Ion: 1.16 mmol/L (ref 1.15–1.40)
Chloride: 102 mmol/L (ref 101–111)
Glucose, Bld: 84 mg/dL (ref 65–99)
HEMATOCRIT: 41 % (ref 36.0–46.0)
HEMOGLOBIN: 13.9 g/dL (ref 12.0–15.0)
POTASSIUM: 3.9 mmol/L (ref 3.5–5.1)
Sodium: 137 mmol/L (ref 135–145)
TCO2: 27 mmol/L (ref 0–100)

## 2017-02-27 LAB — I-STAT TROPONIN, ED
TROPONIN I, POC: 0 ng/mL (ref 0.00–0.08)
TROPONIN I, POC: 0 ng/mL (ref 0.00–0.08)

## 2017-02-27 LAB — RAPID URINE DRUG SCREEN, HOSP PERFORMED
Amphetamines: NOT DETECTED
BARBITURATES: NOT DETECTED
BENZODIAZEPINES: POSITIVE — AB
COCAINE: NOT DETECTED
Opiates: POSITIVE — AB
Tetrahydrocannabinol: NOT DETECTED

## 2017-02-27 LAB — LIPASE, BLOOD

## 2017-02-27 LAB — APTT: APTT: 27 s (ref 24–36)

## 2017-02-27 MED ORDER — NITROGLYCERIN 0.4 MG SL SUBL: 0.4000 mg | SUBLINGUAL_TABLET | SUBLINGUAL | Status: DC | PRN

## 2017-02-27 MED ORDER — NITROGLYCERIN 2 % TD OINT
1.0000 [in_us] | TOPICAL_OINTMENT | Freq: Once | TRANSDERMAL | Status: AC
  Administered 2017-02-27: 1 [in_us] via TOPICAL
  Filled 2017-02-27: qty 1

## 2017-02-27 MED ORDER — OXYCODONE-ACETAMINOPHEN 5-325 MG PO TABS
2.0000 | ORAL_TABLET | Freq: Once | ORAL | Status: AC
  Administered 2017-02-27: 2 via ORAL
  Filled 2017-02-27: qty 2

## 2017-02-27 MED ORDER — ACETAMINOPHEN 325 MG PO TABS
650.0000 mg | ORAL_TABLET | Freq: Once | ORAL | Status: DC
  Filled 2017-02-27: qty 2

## 2017-02-27 MED ORDER — IOPAMIDOL (ISOVUE-370) INJECTION 76%
INTRAVENOUS | Status: AC
  Administered 2017-02-27: 100 mL
  Filled 2017-02-27: qty 100

## 2017-02-27 MED ORDER — MORPHINE SULFATE (PF) 4 MG/ML IV SOLN
4.0000 mg | Freq: Once | INTRAVENOUS | Status: AC
  Administered 2017-02-27: 4 mg via INTRAVENOUS
  Filled 2017-02-27: qty 1

## 2017-02-27 MED ORDER — BENZONATATE 100 MG PO CAPS: 100.0000 mg | ORAL_CAPSULE | Freq: Three times a day (TID) | ORAL | 0 refills | Status: DC

## 2017-02-27 MED ORDER — SODIUM CHLORIDE 0.9 % IV BOLUS (SEPSIS)
1000.0000 mL | Freq: Once | INTRAVENOUS | Status: AC
Start: 2017-02-27 — End: 2017-02-27
  Administered 2017-02-27: 1000 mL via INTRAVENOUS

## 2017-02-27 MED ORDER — ASPIRIN 81 MG PO CHEW
324.0000 mg | CHEWABLE_TABLET | Freq: Once | ORAL | Status: DC
  Filled 2017-02-27: qty 4

## 2017-02-27 NOTE — ED Notes (Signed)
Patient transported to CT 

## 2017-02-27 NOTE — ED Provider Notes (Signed)
[facility] DEPT Provider Note   CSN: [id number] Arrival date & time: 02/27/17  4098     History   Chief Complaint Chief Complaint  Patient presents with  . Chest Pain    HPI Lynn Wallace is a 46 y.o. female.  HPI Pt started having pain in her chest last night after about 10pm.   The pain is sharp and it goes to her back.  It goes across towards both sides.  No fevers or coughing.  She has edema in her legs but nothing new.  No history of blood clots. No history of CAD.   NO immediate family history of heart disease.  Grandparents had problems though.   Pt was given asa and ntg by EMS.  Her symptoms have not improved and now she has a bad headache. Past Medical History:  Diagnosis Date  . Anxiety 2013  . 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 Bronchitis   . Chronic dental pain   . Chronic low back pain 2012  . COPD (chronic obstructive pulmonary disease) ([place])   . Drug-seeking behavior   . GERD (gastroesophageal reflux disease)   . Irritable bowel syndrome (IBS) 2012  . Polysubstance abuse 1990-2008   including narcotics, cocaine, benzodiazepines,  and marijuana  . Psychiatric problem    multiple addmissons for detox  . Swelling    "all over"  . UTI (lower urinary tract infection)     Patient Active Problem List   Diagnosis Date Noted  . Fatigue 01/06/2015  . Chest pain 01/06/2015  . GOLD GRADE B COPD 09/08/2014  . Leg swelling 09/08/2014  . Drug-seeking behavior 09/01/2014  . Visual changes 05/06/2011  . Tobacco use disorder 05/06/2011  . INSOMNIA 08/20/2010  . Anxiety state, unspecified 04/26/2010  . UNSPECIFIED DRUG DEPENDENCE UNSPECIFIED ABUSE 04/26/2010  . DEPRESSION, RECURRENT 04/26/2010  . BACK PAIN, LUMBAR, CHRONIC 04/26/2010    Past Surgical History:  Procedure Laterality Date  . DILATION AND CURETTAGE OF UTERUS  2000  . TUBAL LIGATION  2001    OB History    Gravida Para Term Preterm AB Living     7 5 5  0 2 5   SAB TAB Ectopic Multiple Live Births   1 1             Home Medications    Prior to Admission medications   Medication Sig Start Date End Date Taking? Authorizing Provider  acetaminophen (TYLENOL) 650 MG CR tablet Take 1,950 mg by mouth 2 (two) times daily as needed for pain (back pain).     Historical Provider, MD  albuterol (PROVENTIL HFA;VENTOLIN HFA) 108 (90 BASE) MCG/ACT inhaler Inhale 2 puffs into the lungs every 6 (six) hours as needed for wheezing or shortness of breath. 01/05/15   Juanito Doom, MD  benzonatate (TESSALON) 100 MG capsule Take 1 capsule (100 mg total) by mouth every 8 (eight) hours. 02/27/17   Dorie Rank, MD  clonazePAM (KLONOPIN) 1 MG tablet Take 1 mg by mouth 3 (three) times daily as needed for anxiety.    Historical Provider, MD  furosemide (LASIX) 20 MG tablet Take 20 mg by mouth daily as needed for fluid or edema. Reported on 01/03/2016    Historical Provider, MD  mometasone-formoterol (DULERA) 200-5 MCG/ACT AERO Inhale 2 puffs into the lungs 2 (two) times daily. 08/16/15   Tammy S Parrett, NP  oxyCODONE-acetaminophen (PERCOCET) 10-325 MG tablet Take 1 tablet by mouth 4 (four) times  daily as needed for pain.  03/31/16   Historical Provider, MD    Family History Family History  Problem Relation Age of Onset  . Coronary artery disease      Female 1st degree relative <60  . Lung cancer Maternal Grandfather   . Stroke      M 1st degree relative <50  . Arthritis Mother   . Cancer Mother     cervical cancer   . Cancer Brother     throat cancer   . Heart disease Maternal Grandmother   . Heart disease Maternal Grandfather   . Diabetes Maternal Grandfather   . Throat cancer Brother     Social History Social History  Substance Use Topics  . Smoking status: Current Some Day Smoker    Packs/day: 2.00    Years: 31.00    Types: Cigarettes    Last attempt to quit: 03/25/2015  . Smokeless tobacco: Never Used     Comment: 1-2 a week  . Alcohol  use No     Allergies   Doxycycline; Hydrocodone-acetaminophen; Ibuprofen; Penicillins; Tramadol; Cleocin [clindamycin hcl]; and Keflex [cephalexin]   Review of Systems Review of Systems  All other systems reviewed and are negative.    Physical Exam Updated Vital Signs BP (!) 119/105   Pulse 63   Temp 97.5 F (36.4 C) (Oral)   Resp 19   SpO2 98%   Physical Exam  HENT:  Head: Normocephalic and atraumatic.  Right Ear: External ear normal.  Left Ear: External ear normal.  Eyes: Conjunctivae are normal. Right eye exhibits no discharge. Left eye exhibits no discharge. No scleral icterus.  Neck: Neck supple. No tracheal deviation present.  Cardiovascular: Normal rate, regular rhythm and intact distal pulses.   Pulmonary/Chest: Effort normal and breath sounds normal. No stridor. No respiratory distress. She has no wheezes. She has no rales.  Abdominal: Soft. Bowel sounds are normal. She exhibits no distension. There is no tenderness. There is no rebound and no guarding.  Musculoskeletal: She exhibits no edema or tenderness.  Extremities are warm, radial pulses are weaker bilaterally  Neurological: She is alert. She has normal strength. No cranial nerve deficit (no facial droop, extraocular movements intact, no slurred speech) or sensory deficit. She exhibits normal muscle tone. She displays no seizure activity. Coordination normal.  Skin: Skin is warm and dry. No rash noted. She is not diaphoretic.  Psychiatric: She has a normal mood and affect.  Nursing note and vitals reviewed.    ED Treatments / Results  Labs (all labs ordered are listed, but only abnormal results are displayed) Labs Reviewed  COMPREHENSIVE METABOLIC PANEL - Abnormal; Notable for the following:       Result Value   Total Bilirubin 0.1 (*)    All other components within normal limits  LIPASE, BLOOD - Abnormal; Notable for the following:    Lipase <10 (*)    All other components within normal limits   RAPID URINE DRUG SCREEN, HOSP PERFORMED - Abnormal; Notable for the following:    Opiates POSITIVE (*)    Benzodiazepines POSITIVE (*)    All other components within normal limits  APTT  CBC  PROTIME-INR  I-STAT TROPOININ, ED  I-STAT CHEM 8, ED  I-STAT TROPOININ, ED    EKG  EKG Interpretation  Date/Time:  Friday February 27 2017 10:07:42 EDT Ventricular Rate:  68 PR Interval:    QRS Duration: 95 QT Interval:  393 QTC Calculation: 418 R Axis:   73 Text  Interpretation:  Sinus rhythm Low voltage, precordial leads Borderline T abnormalities, anterior leads No significant change since last tracing Confirmed by Michiko Lineman  MD-J, Aniko Finnigan ([id number]) on 02/27/2017 11:08:54 AM Also confirmed by Bonne Whack  MD-J, Kassady Laboy [phone]), editor Drema Pry [phone])  on 02/27/2017 11:20:44 AM       Radiology Ct Angio Chest Pe W And/or Wo Contrast  Result Date: 02/27/2017 CLINICAL DATA:  Chest pain EXAM: CT ANGIOGRAPHY CHEST WITH CONTRAST TECHNIQUE: Multidetector CT imaging of the chest was performed using the standard protocol during bolus administration of intravenous contrast. Multiplanar CT image reconstructions and MIPs were obtained to evaluate the vascular anatomy. CONTRAST:  100 mL Isovue 370 nonionic COMPARISON:  Chest radiograph February 27, 2017 FINDINGS: Cardiovascular: There is no demonstrable pulmonary embolus. There is no thoracic aortic aneurysm. While no thoracic aortic dissection is appreciable on this study, the contrast bolus was not timed to optimize aortic visualization, and potentially dissection could be obscured in this circumstance. The visualized great vessels appear unremarkable. There is no appreciable pericardial thickening. Mediastinum/Nodes: Thyroid appears normal. There is no appreciable thoracic adenopathy. There is a small hiatal hernia. Lungs/Pleura: There is mild bibasilar lung atelectatic change. There is no lung edema or consolidation. There is no pleural effusion or pleural thickening.  There is a calcified granuloma in the posterior left base. On axial slice 85 series 6, there is a 3 mm nodular opacity in the lateral segment of the left lower lobe. Upper Abdomen: There is reflux of contrast into the inferior vena cava and hepatic veins. Visualized upper abdominal structures otherwise appear normal. Musculoskeletal: There are no blastic or lytic bone lesions. No chest wall lesions evident. Review of the MIP images confirms the above findings. IMPRESSION: No apparent pulmonary embolus. No thoracic aortic aneurysm. Note that the current contrast bolus is not sufficiently opacifying the thoracic aorta to exclude aortic dissection. If thoracic aortic dissection is of clinical concern, repeat study with timing bolus optimized to visualize aorta would be reasonable. 3 mm nodular opacity left base. There is a nearby calcified granuloma as well. No follow-up needed if patient is low-risk. Non-contrast chest CT can be considered in 12 months if patient is high-risk. This recommendation follows the consensus statement: Guidelines for Management of Incidental Pulmonary Nodules Detected on CT Images: From the Fleischner Society 2017; Radiology 2017; 284:228-243. Mild bibasilar atelectasis. No edema or consolidation. No evident adenopathy. Reflux of contrast into the inferior vena cava and hepatic veins may indicate a degree of increased right heart pressure. Electronically Signed   By: Lowella Grip III M.D.   On: 02/27/2017 12:03   Dg Chest Portable 1 View  Result Date: 02/27/2017 CLINICAL DATA:  Chest pain since last night EXAM: PORTABLE CHEST 1 VIEW COMPARISON:  01/27/2015 FINDINGS: Lungs are clear.  No pleural effusion or pneumothorax. The heart is normal in size. Visualized osseous structures are within normal limits. IMPRESSION: No evidence of acute cardiopulmonary disease. Electronically Signed   By: Julian Hy M.D.   On: 02/27/2017 10:49    Procedures Procedures (including critical  care time)  Medications Ordered in ED Medications  aspirin chewable tablet 324 mg (0 mg Oral Hold 02/27/17 1038)  nitroGLYCERIN (NITROSTAT) SL tablet 0.4 mg (not administered)  acetaminophen (TYLENOL) tablet 650 mg (650 mg Oral Refused 02/27/17 1039)  oxyCODONE-acetaminophen (PERCOCET/ROXICET) 5-325 MG per tablet 2 tablet (not administered)  nitroGLYCERIN (NITROGLYN) 2 % ointment 1 inch (1 inch Topical Given 02/27/17 1043)  sodium chloride 0.9 % bolus 1,000 mL (0 mLs  Intravenous Stopped 02/27/17 1211)  morphine 4 MG/ML injection 4 mg (4 mg Intravenous Given 02/27/17 1056)  iopamidol (ISOVUE-370) 76 % injection (100 mLs  Contrast Given 02/27/17 1146)  morphine 4 MG/ML injection 4 mg (4 mg Intravenous Given 02/27/17 1317)     Initial Impression / Assessment and Plan / ED Course  I have reviewed the triage vital signs and the nursing notes.  Pertinent labs & imaging results that were available during my care of the patient were reviewed by me and considered in my medical decision making (see chart for details).  Clinical Course as of Feb 28 1420  Fri Feb 27, 2017  1014 BP low in the ED.  S/p NTG  [JK]  1028 Low BP noted.  May be related to the NTG.  Will give fluid bolus and scan chest to assess for PE, dissection, other vascular emergency  [JK]  1215 Still having some intermittent sharp chest pain.  BP is low is improving but she is on nitropaste.  Will check on second troponin  [JK]    Clinical Course User Index [JK] Dorie Rank, MD   Patient presented to the emergency room with complaints of sharp pleuritic-type chest pain.   Patient does not have a history of coronary artery disease but she does smoke cigarettes. Her symptoms were more pleuritic in nature. She notices it gets worse when she takes a deep breath.  I was concerned about the possibility of pulmonary embolism considering her pleuritic chest pain. I was less concerned about aortic dissection. CT scan did not show any evidence of aneurysm.  No evidence of pulmonary embolism.  She was treated for her pain in the emergency room.  Serial cardiac enzymes are negative. I doubt acute coronary syndrome.   At this time there does not appear to be any evidence of an acute emergency medical condition and the patient appears stable for discharge with appropriate outpatient follow up.  Final Clinical Impressions(s) / ED Diagnoses   Final diagnoses:  Pleurisy    New Prescriptions New Prescriptions   BENZONATATE (TESSALON) 100 MG CAPSULE    Take 1 capsule (100 mg total) by mouth every 8 (eight) hours.     Dorie Rank, MD 02/27/17 [phone]

## 2017-02-27 NOTE — Discharge Instructions (Signed)
Follow-up with your primary care doctor, take your medications as needed for pain,  monitor for fever or worsening symptoms

## 2017-02-27 NOTE — ED Triage Notes (Addendum)
Pt reports that she has chest pain that started last night radiating to sides and back. Worse with inspiration. Smoker. No nausea, vomiting, diaphoresis reported. Diarr for last few days. PT takes extensive med regime for back pain. Nitro and aspirin given by EMS and no relief.

## 2017-03-04 ENCOUNTER — Telehealth: Payer: Self-pay

## 2017-03-04 NOTE — Telephone Encounter (Signed)
Dr. Fuller Plan,  Lynn Wallace is scheduled for a colonoscopy on 03/12/17. This patient is a constant no show for her pre- visit appointments. I have cancelled and rescheduled her twice for pre- visit. In looking at her appointments I see that she has quite a history of No showing therefore she is cancelled and rescheduled.  She has been scheduled for pre visit 5 times starting back in May 2017. Do you want me to keep scheduling this patient? Every time that she is rescheduled we have to go back an re-enter her prep instructions. Please advise. Thanks.   Riki Sheer, LPN

## 2017-03-04 NOTE — Telephone Encounter (Signed)
Please do not reschedule her for a direct colonoscopy. If she wants to proceed with colonoscopy an office visit to further evaluate, discuss.

## 2017-03-05 ENCOUNTER — Ambulatory Visit (AMBULATORY_SURGERY_CENTER): Payer: Self-pay

## 2017-03-05 VITALS — Ht 62.0 in | Wt 217.2 lb

## 2017-03-05 DIAGNOSIS — Z860100 Personal history of colon polyps, unspecified: Secondary | ICD-10-CM

## 2017-03-05 MED ORDER — SUPREP BOWEL PREP KIT 17.5-3.13-1.6 GM/177ML PO SOLN: 1.0000 | Freq: Once | ORAL | 0 refills | Status: AC

## 2017-03-05 NOTE — Progress Notes (Signed)
No allergies to eggs or soy No diet meds No home oxygen No past problems with anesthesia  Declined emmi 

## 2017-03-05 NOTE — Telephone Encounter (Signed)
Dr.Stark,  Lynn Wallace actually came for her Pre- Visit today! I am hoping that she will show up for her colonoscopy appointment. She seemed to have trouble focusing on instructions. I was just wandering if there is a magic number as to how many times we re-schedule appointments in pre- visit. This patient has a history of Tubular Adenoma polyps so she really does need to have a colonoscopy. Thanks.   Riki Sheer, LPN

## 2017-03-05 NOTE — Telephone Encounter (Signed)
OK. We will see if she follows through with her colonoscopy.

## 2017-03-12 ENCOUNTER — Ambulatory Visit (AMBULATORY_SURGERY_CENTER): Payer: Self-pay | Admitting: Gastroenterology

## 2017-03-12 ENCOUNTER — Encounter: Payer: Self-pay | Admitting: Gastroenterology

## 2017-03-12 VITALS — BP 101/63 | HR 71 | Temp 98.4°F | Resp 21 | Ht 62.0 in | Wt 223.0 lb

## 2017-03-12 DIAGNOSIS — D123 Benign neoplasm of transverse colon: Secondary | ICD-10-CM

## 2017-03-12 DIAGNOSIS — Z86010 Personal history of colonic polyps: Secondary | ICD-10-CM

## 2017-03-12 MED ORDER — SODIUM CHLORIDE 0.9 % IV SOLN: 500.0000 mL | INTRAVENOUS | Status: AC

## 2017-03-12 NOTE — Patient Instructions (Signed)
Discharge instructions given. Handouts on polyps and diverticulosis. Resume previous medications. YOU HAD AN ENDOSCOPIC PROCEDURE TODAY AT THE [facility]:   Refer to the procedure report that was given to you for any specific questions about what was found during the examination.  If the procedure report does not answer your questions, please call your gastroenterologist to clarify.  If you requested that your care partner not be given the details of your procedure findings, then the procedure report has been included in a sealed envelope for you to review at your convenience later.  YOU SHOULD EXPECT: Some feelings of bloating in the abdomen. Passage of more gas than usual.  Walking can help get rid of the air that was put into your GI tract during the procedure and reduce the bloating. If you had a lower endoscopy (such as a colonoscopy or flexible sigmoidoscopy) you may notice spotting of blood in your stool or on the toilet paper. If you underwent a bowel prep for your procedure, you may not have a normal bowel movement for a few days.  Please Note:  You might notice some irritation and congestion in your nose or some drainage.  This is from the oxygen used during your procedure.  There is no need for concern and it should clear up in a day or so.  SYMPTOMS TO REPORT IMMEDIATELY:   Following lower endoscopy (colonoscopy or flexible sigmoidoscopy):  Excessive amounts of blood in the stool  Significant tenderness or worsening of abdominal pains  Swelling of the abdomen that is new, acute  Fever of 100F or higher   For urgent or emergent issues, a gastroenterologist can be reached at any hour by calling [phone].   DIET:  We do recommend a small meal at first, but then you may proceed to your regular diet.  Drink plenty of fluids but you should avoid alcoholic beverages for 24 hours.  ACTIVITY:  You should plan to take it easy for the rest of today and you should NOT  DRIVE or use heavy machinery until tomorrow (because of the sedation medicines used during the test).    FOLLOW UP: Our staff will call the number listed on your records the next business day following your procedure to check on you and address any questions or concerns that you may have regarding the information given to you following your procedure. If we do not reach you, we will leave a message.  However, if you are feeling well and you are not experiencing any problems, there is no need to return our call.  We will assume that you have returned to your regular daily activities without incident.  If any biopsies were taken you will be contacted by phone or by letter within the next 1-3 weeks.  Please call us at [phone] if you have not heard about the biopsies in 3 weeks.    SIGNATURES/CONFIDENTIALITY: You and/or your care partner have signed paperwork which will be entered into your electronic medical record.  These signatures attest to the fact that that the information above on your After Visit Summary has been reviewed and is understood.  Full responsibility of the confidentiality of this discharge information lies with you and/or your care-partner. 

## 2017-03-12 NOTE — Progress Notes (Signed)
Late entry- [name] carepartner called back to recovery.  While ambulating back to pt's bay, pt falls to the floor.  Assisted up per Lucretia Kern CRNA and Lafe Garin CRNA to chair.   Pt states he didn't hit his head, didn't faint or black out.  He states he feels like blood sugar dropped.  He states his blood sugar at home "was in the 300's, so he took 10 units of his insulin."  Blood sugar 142 in the RR. He is sluggish with his responses.  OJ, crackers, and peanut butter given to care partner.  No bruises or abrasions  I strongly encouraged him to call someone to drive them home.  He states there is no one available to drive him home.  Patient states, "he does this all the time."  At the 20 minute mark, she states several time, "I am ready to go now."  [name] to drive them to [place], pt states "I will be fine."  Patient's blood sugar 114 and both of them are adamant That he can drive home. Several times pt and care partner were encouraged to call someone but refuses. Offered for two of our transporters to go to car to get pt's purse to try to get cell phone to call mother but pt refuses. Glucagon 2 tablets given before discharge.  Pt states they will park the car and call her mother to pick them up in the parking lot after we have put her in the car

## 2017-03-12 NOTE — Progress Notes (Signed)
Report given to PACU, vss 

## 2017-03-12 NOTE — Progress Notes (Signed)
Called to room to assist during endoscopic procedure.  Patient ID and intended procedure confirmed with present staff. Received instructions for my participation in the procedure from the performing physician.  

## 2017-03-12 NOTE — Progress Notes (Signed)
No changes since previsit. 

## 2017-03-12 NOTE — Op Note (Signed)
[facility] Patient Name: Lynn Wallace Procedure Date: 03/12/2017 7:59 AM MRN: [id number] Endoscopist: Ladene Artist , MD Age: 46 Referring MD:  Date of Birth: December 23, 1970 Gender: Female Account #: [id number] Procedure:                Colonoscopy Indications:              Surveillance: Personal history of adenomatous                            polyps on last colonoscopy > 5 years ago Medicines:                Monitored Anesthesia Care Procedure:                Pre-Anesthesia Assessment:                           - Prior to the procedure, a History and Physical                            was performed, and patient medications and                            allergies were reviewed. The patient's tolerance of                            previous anesthesia was also reviewed. The risks                            and benefits of the procedure and the sedation                            options and risks were discussed with the patient.                            All questions were answered, and informed consent                            was obtained. Prior Anticoagulants: The patient has                            taken no previous anticoagulant or antiplatelet                            agents. ASA Grade Assessment: II - A patient with                            mild systemic disease. After reviewing the risks                            and benefits, the patient was deemed in                            satisfactory condition to undergo the procedure.  After obtaining informed consent, the colonoscope                            was passed under direct vision. Throughout the                            procedure, the patient's blood pressure, pulse, and                            oxygen saturations were monitored continuously. The                            Colonoscope was introduced through the anus and                            advanced to the the  cecum, identified by                            appendiceal orifice and ileocecal valve. The                            ileocecal valve, appendiceal orifice, and rectum                            were photographed. The quality of the bowel                            preparation was excellent. The colonoscopy was                            performed without difficulty. The patient tolerated                            the procedure well. Scope In: 8:15:26 AM Scope Out: 8:28:57 AM Scope Withdrawal Time: 0 hours 10 minutes 37 seconds  Total Procedure Duration: 0 hours 13 minutes 31 seconds  Findings:                 The perianal and digital rectal examinations were                            normal.                           A 14 mm polyp was found in the transverse colon.                            The polyp was sessile. The polyp was removed with a                            hot snare. Resection and retrieval were complete.                           A few small-mouthed diverticula were found in the  sigmoid colon. There was no evidence of                            diverticular bleeding.                           The exam was otherwise without abnormality on                            direct and retroflexion views. Complications:            No immediate complications. Estimated blood loss:                            None. Estimated Blood Loss:     Estimated blood loss: none. Impression:               - One 14 mm polyp in the transverse colon, removed                            with a hot snare. Resected and retrieved.                           - Mild diverticulosis in the sigmoid colon. There                            was no evidence of diverticular bleeding.                           - The examination was otherwise normal on direct                            and retroflexion views. Recommendation:           - Repeat colonoscopy in 3 - 5 years for                             surveillance pending pathology review.                           - Patient has a contact number available for                            emergencies. The signs and symptoms of potential                            delayed complications were discussed with the                            patient. Return to normal activities tomorrow.                            Written discharge instructions were provided to the                            patient.                           -  Resume previous diet.                           - Continue present medications.                           - Await pathology results.                           - No aspirin, ibuprofen, naproxen, or other                            non-steroidal anti-inflammatory drugs for 2 weeks                            after polyp removal. Ladene Artist, MD 03/12/2017 8:33:03 AM This report has been signed electronically.

## 2017-03-13 ENCOUNTER — Telehealth: Payer: Self-pay | Admitting: *Deleted

## 2017-03-13 NOTE — Telephone Encounter (Signed)
  Follow up Call-  Call back number 03/12/2017  Post procedure Call Back phone  # [phone]  Permission to leave phone message Yes  Some recent data might be hidden     Patient questions:  Do you have a fever, pain , or abdominal swelling? No. Pain Score  0 *  Have you tolerated food without any problems? Yes.    Have you been able to return to your normal activities? Yes.    Do you have any questions about your discharge instructions: Diet   No. Medications  No. Follow up visit  No.  Do you have questions or concerns about your Care? No.  Actions: * If pain score is 4 or above: No action needed, pain <4.

## 2017-03-24 ENCOUNTER — Encounter: Payer: Self-pay | Admitting: Gastroenterology

## 2018-08-05 IMAGING — CR DG CHEST 1V PORT
1 series · 1 of 1 positions shown · non-contrast
Comparison: 01/27/2015

CLINICAL DATA: Chest pain since last night

EXAM:
PORTABLE CHEST 1 VIEW

[AP]
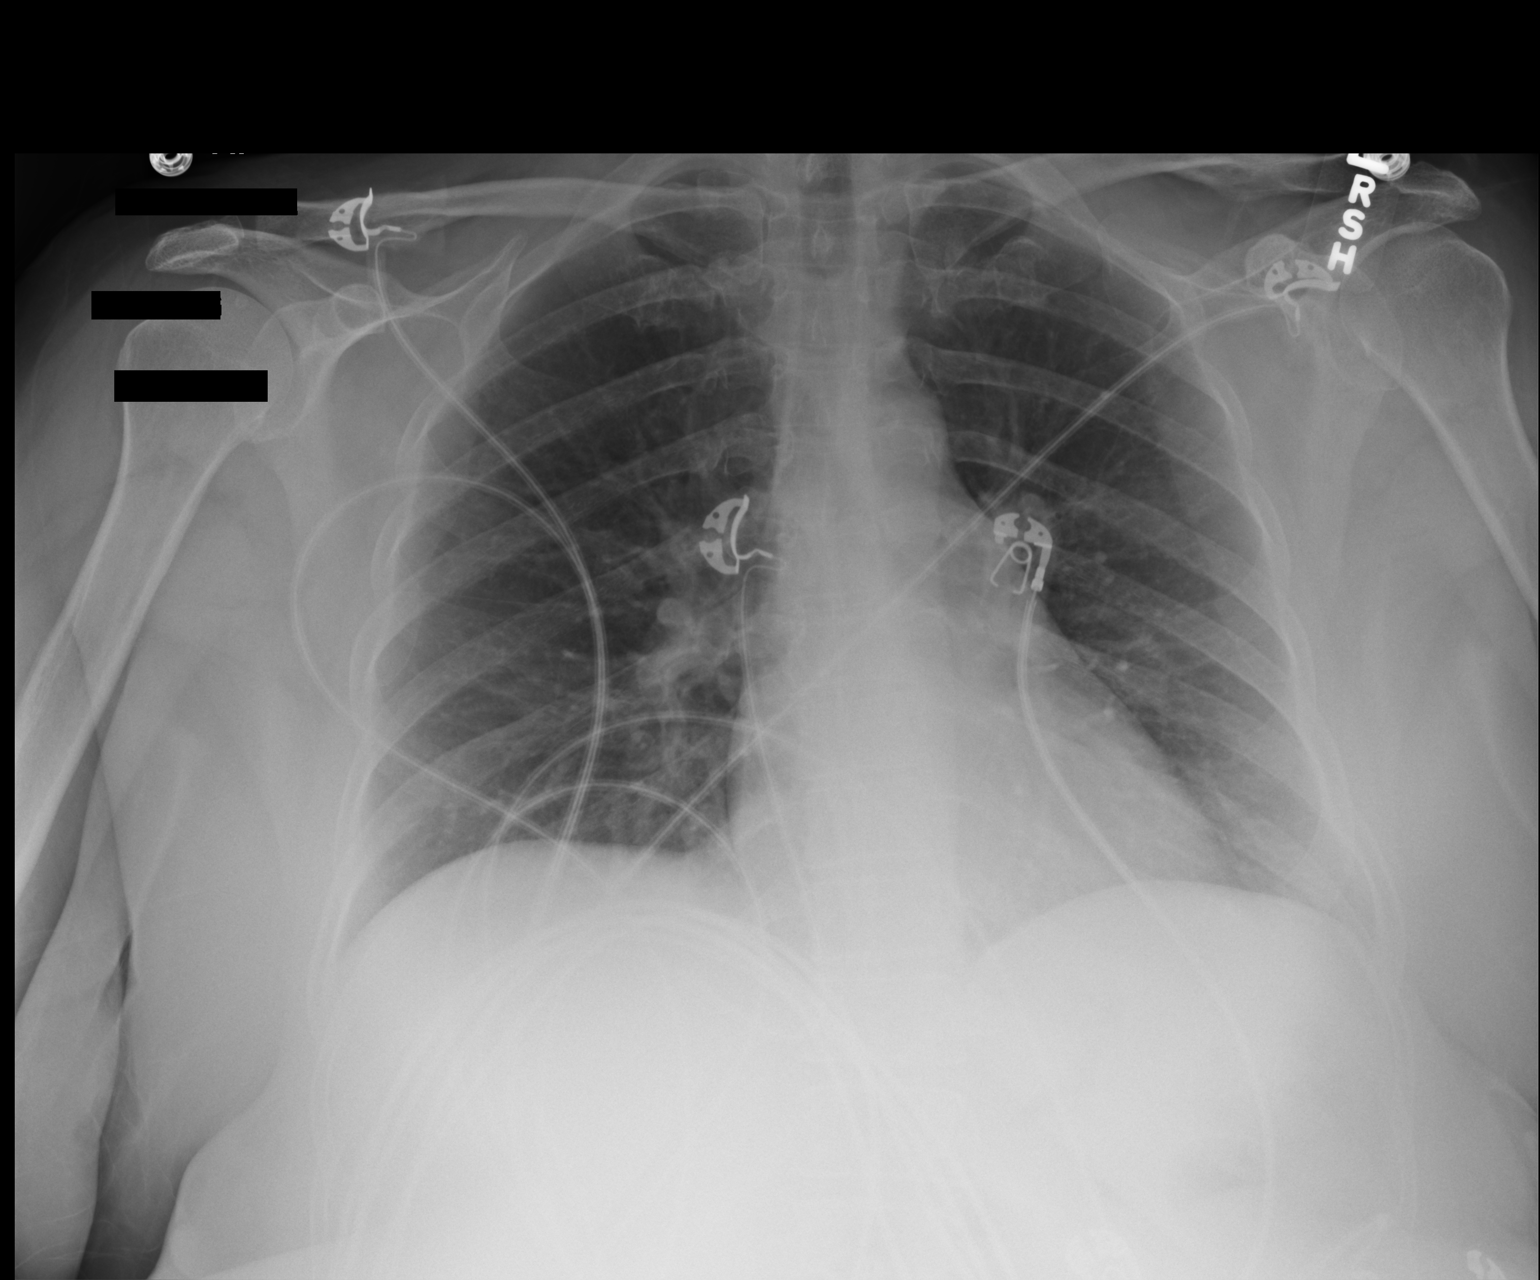

[1 of 1 positions shown; findings below may reference images not displayed]

FINDINGS: Lungs are clear.  No pleural effusion or pneumothorax.

The heart is normal in size.

Visualized osseous structures are within normal limits.
IMPRESSION: No evidence of acute cardiopulmonary disease.

## 2018-12-30 NOTE — Progress Notes (Signed)
This encounter was created in error - please disregard.

## 2019-12-28 ENCOUNTER — Encounter: Payer: Self-pay | Admitting: Gastroenterology
